# Patient Record
Sex: Male | Born: 1950 | Race: Black or African American | Hispanic: No | Marital: Single | Smoking: Former smoker
Health system: Southern US, Community
[De-identification: ages and names within clinical notes are randomized; demographics above are authoritative.]

## PROBLEM LIST (undated history)

## (undated) DIAGNOSIS — M1990 Unspecified osteoarthritis, unspecified site: Secondary | ICD-10-CM

## (undated) DIAGNOSIS — B20 Human immunodeficiency virus [HIV] disease: Secondary | ICD-10-CM

## (undated) DIAGNOSIS — J189 Pneumonia, unspecified organism: Secondary | ICD-10-CM

## (undated) DIAGNOSIS — B182 Chronic viral hepatitis C: Secondary | ICD-10-CM

## (undated) DIAGNOSIS — F1911 Other psychoactive substance abuse, in remission: Secondary | ICD-10-CM

## (undated) DIAGNOSIS — J449 Chronic obstructive pulmonary disease, unspecified: Secondary | ICD-10-CM

## (undated) DIAGNOSIS — I10 Essential (primary) hypertension: Secondary | ICD-10-CM

## (undated) HISTORY — PX: INGUINAL HERNIA REPAIR: SUR1180

## (undated) HISTORY — DX: Other psychoactive substance abuse, in remission: F19.11

---

## 2015-03-19 ENCOUNTER — Telehealth: Payer: Self-pay

## 2015-03-27 ENCOUNTER — Ambulatory Visit: Payer: Self-pay

## 2015-03-27 ENCOUNTER — Other Ambulatory Visit (HOSPITAL_COMMUNITY)
Admission: RE | Admit: 2015-03-27 | Discharge: 2015-03-27 | Disposition: A | Discharge status: Home / Self Care | Payer: Medicaid Other | Source: Ambulatory Visit | Attending: Internal Medicine | Admitting: Internal Medicine

## 2015-03-27 DIAGNOSIS — Z113 Encounter for screening for infections with a predominantly sexual mode of transmission: Secondary | ICD-10-CM | POA: Insufficient documentation

## 2015-03-27 DIAGNOSIS — R768 Other specified abnormal immunological findings in serum: Secondary | ICD-10-CM

## 2015-03-27 DIAGNOSIS — G8929 Other chronic pain: Secondary | ICD-10-CM

## 2015-03-27 DIAGNOSIS — M1611 Unilateral primary osteoarthritis, right hip: Secondary | ICD-10-CM

## 2015-03-27 DIAGNOSIS — R05 Cough: Secondary | ICD-10-CM

## 2015-03-27 DIAGNOSIS — J441 Chronic obstructive pulmonary disease with (acute) exacerbation: Secondary | ICD-10-CM

## 2015-03-27 DIAGNOSIS — Z8619 Personal history of other infectious and parasitic diseases: Secondary | ICD-10-CM

## 2015-03-27 DIAGNOSIS — R059 Cough, unspecified: Secondary | ICD-10-CM

## 2015-03-27 DIAGNOSIS — Z111 Encounter for screening for respiratory tuberculosis: Secondary | ICD-10-CM

## 2015-03-27 DIAGNOSIS — F1911 Other psychoactive substance abuse, in remission: Secondary | ICD-10-CM

## 2015-03-27 DIAGNOSIS — I10 Essential (primary) hypertension: Secondary | ICD-10-CM

## 2015-03-27 DIAGNOSIS — K7460 Unspecified cirrhosis of liver: Secondary | ICD-10-CM

## 2015-03-27 DIAGNOSIS — Z21 Asymptomatic human immunodeficiency virus [HIV] infection status: Secondary | ICD-10-CM

## 2015-03-27 DIAGNOSIS — E785 Hyperlipidemia, unspecified: Secondary | ICD-10-CM

## 2015-03-27 DIAGNOSIS — M25551 Pain in right hip: Secondary | ICD-10-CM

## 2015-03-27 LAB — CBC WITH DIFFERENTIAL/PLATELET
BASOS PCT: 3 % — AB (ref 0–1)
Basophils Absolute: 0.1 10*3/uL (ref 0.0–0.1)
EOS PCT: 2 % (ref 0–5)
Eosinophils Absolute: 0.1 10*3/uL (ref 0.0–0.7)
HEMATOCRIT: 45.6 % (ref 39.0–52.0)
HEMOGLOBIN: 15.7 g/dL (ref 13.0–17.0)
LYMPHS PCT: 38 % (ref 12–46)
Lymphs Abs: 1.5 10*3/uL (ref 0.7–4.0)
MCH: 31.8 pg (ref 26.0–34.0)
MCHC: 34.4 g/dL (ref 30.0–36.0)
MCV: 92.3 fL (ref 78.0–100.0)
MONO ABS: 0.4 10*3/uL (ref 0.1–1.0)
MONOS PCT: 10 % (ref 3–12)
MPV: 9.2 fL (ref 8.6–12.4)
Neutro Abs: 1.9 10*3/uL (ref 1.7–7.7)
Neutrophils Relative %: 47 % (ref 43–77)
Platelets: 297 10*3/uL (ref 150–400)
RBC: 4.94 MIL/uL (ref 4.22–5.81)
RDW: 13.8 % (ref 11.5–15.5)
WBC: 4 10*3/uL (ref 4.0–10.5)

## 2015-03-27 LAB — LIPID PANEL
Cholesterol: 129 mg/dL (ref 125–200)
HDL: 77 mg/dL (ref >=40)
LDL CALC: 36 mg/dL (ref <130)
TRIGLYCERIDES: 81 mg/dL (ref <150)
Total CHOL/HDL Ratio: 1.7 Ratio (ref <=5.0)
VLDL: 16 mg/dL (ref <30)

## 2015-03-27 LAB — COMPLETE METABOLIC PANEL WITH GFR
ALBUMIN: 3.8 g/dL (ref 3.6–5.1)
ALK PHOS: 84 U/L (ref 40–115)
ALT: 35 U/L (ref 9–46)
AST: 79 U/L — AB (ref 10–35)
BUN: 7 mg/dL (ref 7–25)
CALCIUM: 9.2 mg/dL (ref 8.6–10.3)
CHLORIDE: 100 mmol/L (ref 98–110)
CO2: 27 mmol/L (ref 20–31)
Creat: 0.73 mg/dL (ref 0.70–1.25)
Glucose, Bld: 81 mg/dL (ref 65–99)
POTASSIUM: 3.9 mmol/L (ref 3.5–5.3)
Sodium: 135 mmol/L (ref 135–146)
Total Bilirubin: 0.5 mg/dL (ref 0.2–1.2)
Total Protein: 9.4 g/dL — ABNORMAL HIGH (ref 6.1–8.1)

## 2015-03-27 LAB — IRON: IRON: 124 ug/dL (ref 50–180)

## 2015-03-28 LAB — URINALYSIS
Bilirubin Urine: NEGATIVE
Glucose, UA: NEGATIVE
HGB URINE DIPSTICK: NEGATIVE
Ketones, ur: NEGATIVE
NITRITE: NEGATIVE
PH: 6 (ref 5.0–8.0)
Protein, ur: NEGATIVE
SPECIFIC GRAVITY, URINE: 1.01 (ref 1.001–1.035)

## 2015-03-28 LAB — ANA: ANA: NEGATIVE

## 2015-03-28 LAB — HEPATITIS B SURFACE ANTIBODY,QUALITATIVE: HEP B S AB: NEGATIVE

## 2015-03-28 LAB — PROTIME-INR
INR: 1.05 (ref <1.50)
PROTHROMBIN TIME: 13.8 s (ref 11.6–15.2)

## 2015-03-28 LAB — T-HELPER CELL (CD4) - (RCID CLINIC ONLY)
CD4 % Helper T Cell: 29 % — ABNORMAL LOW (ref 33–55)
CD4 T Cell Abs: 430 /uL (ref 400–2700)

## 2015-03-28 LAB — HEPATITIS B CORE ANTIBODY, TOTAL: HEP B C TOTAL AB: REACTIVE — AB

## 2015-03-28 LAB — HEPATITIS B SURFACE ANTIGEN: HEP B S AG: NEGATIVE

## 2015-03-28 LAB — HEPATITIS C ANTIBODY: HCV AB: REACTIVE — AB

## 2015-03-28 LAB — HEPATITIS A ANTIBODY, TOTAL: Hep A Total Ab: REACTIVE — AB

## 2015-03-28 LAB — RPR

## 2015-03-29 LAB — QUANTIFERON TB GOLD ASSAY (BLOOD)
INTERFERON GAMMA RELEASE ASSAY: NEGATIVE
Mitogen value: >10 IU/mL
QUANTIFERON TB AG MINUS NIL: 0.01 [IU]/mL
Quantiferon Nil Value: 0.06 IU/mL
TB Ag value: 0.07 IU/mL

## 2015-03-30 LAB — HIV-1 RNA ULTRAQUANT REFLEX TO GENTYP+
HIV 1 RNA Quant: 116 copies/mL — ABNORMAL HIGH (ref <20)
HIV-1 RNA QUANT, LOG: 2.06 {Log_copies}/mL — AB (ref <1.30)

## 2015-03-30 LAB — HEPATITIS C RNA QUANTITATIVE
HCV Quantitative Log: 6.97 {Log} — ABNORMAL HIGH (ref <1.18)
HCV Quantitative: 9391549 IU/mL — ABNORMAL HIGH (ref <15)

## 2015-03-31 DIAGNOSIS — M1611 Unilateral primary osteoarthritis, right hip: Secondary | ICD-10-CM | POA: Insufficient documentation

## 2015-03-31 DIAGNOSIS — J441 Chronic obstructive pulmonary disease with (acute) exacerbation: Secondary | ICD-10-CM | POA: Insufficient documentation

## 2015-03-31 DIAGNOSIS — R768 Other specified abnormal immunological findings in serum: Secondary | ICD-10-CM | POA: Insufficient documentation

## 2015-03-31 DIAGNOSIS — F1911 Other psychoactive substance abuse, in remission: Secondary | ICD-10-CM | POA: Insufficient documentation

## 2015-03-31 DIAGNOSIS — Z8619 Personal history of other infectious and parasitic diseases: Secondary | ICD-10-CM | POA: Insufficient documentation

## 2015-03-31 HISTORY — DX: Other psychoactive substance abuse, in remission: F19.11

## 2015-03-31 LAB — HEPATITIS C RNA QUANTITATIVE
HCV QUANT LOG: 6.36 {Log} — AB (ref <1.18)
HCV Quantitative: 2314571 IU/mL — ABNORMAL HIGH (ref <15)

## 2015-03-31 LAB — URINE CYTOLOGY ANCILLARY ONLY
CHLAMYDIA, DNA PROBE: NEGATIVE
Neisseria Gonorrhea: NEGATIVE

## 2015-04-01 NOTE — Progress Notes (Signed)
Patient was referred to [facility]  for HIV evaluation by [facility]. Patient needs surgery of right hip and evaluation of HIV is needed prior to surgical procedure.  [name] states he was diagnosed with HIV over 20 years ago in [place].  He has never been on HAART. His last labs were 3 years ago in [place] and he was not sure of the results. He states the ID physicians told him he did not need to start medications. He has never been in consistent care for his HIV and never experienced any symptoms that he thought would be related.  However he is ready to start medications if needed especially to be cleared for surgery. He was previously in a 20 year relationship with a male partner who died about 6 months ago of complications related to a surgical procedure. She was not HIV positive. He has a past history of alcohol and heroin abuse. He has been without drugs for 20 years and drinks an occasional beer. He provides care for his elderly Mother and prior to this was caring for his daughter who was ill and recently passed away.       Laurell Josephs, RN

## 2015-04-02 LAB — HLA B*5701: HLA-B*5701 w/rflx HLA-B High: NEGATIVE

## 2015-04-02 LAB — HEPATITIS C GENOTYPE

## 2015-04-08 DIAGNOSIS — M25551 Pain in right hip: Secondary | ICD-10-CM

## 2015-04-08 DIAGNOSIS — G8929 Other chronic pain: Secondary | ICD-10-CM | POA: Insufficient documentation

## 2015-04-10 ENCOUNTER — Ambulatory Visit: Payer: Medicaid Other | Admitting: Internal Medicine

## 2015-04-15 ENCOUNTER — Ambulatory Visit (INDEPENDENT_AMBULATORY_CARE_PROVIDER_SITE_OTHER): Payer: Medicaid Other | Admitting: Internal Medicine

## 2015-04-15 ENCOUNTER — Encounter: Payer: Self-pay | Admitting: Internal Medicine

## 2015-04-15 ENCOUNTER — Other Ambulatory Visit: Payer: Self-pay | Admitting: Internal Medicine

## 2015-04-15 VITALS — BP 162/90 | HR 77 | Temp 97.5°F | Ht 71.0 in | Wt 126.0 lb

## 2015-04-15 DIAGNOSIS — B20 Human immunodeficiency virus [HIV] disease: Secondary | ICD-10-CM

## 2015-04-15 DIAGNOSIS — G8929 Other chronic pain: Secondary | ICD-10-CM | POA: Diagnosis not present

## 2015-04-15 DIAGNOSIS — M25551 Pain in right hip: Secondary | ICD-10-CM | POA: Diagnosis not present

## 2015-04-15 DIAGNOSIS — B182 Chronic viral hepatitis C: Secondary | ICD-10-CM | POA: Diagnosis not present

## 2015-04-15 DIAGNOSIS — M1611 Unilateral primary osteoarthritis, right hip: Secondary | ICD-10-CM

## 2015-04-15 MED ORDER — ELVITEG-COBIC-EMTRICIT-TENOFAF 150-150-200-10 MG PO TABS: 1.0000 | ORAL_TABLET | Freq: Every day | ORAL | Status: DC

## 2015-04-15 NOTE — Progress Notes (Signed)
Patient ID: Kevin Jimenez, male    DOB: [birth year], 64 y.o.   MRN: [id number]  Reason for visit: to establish care as a new patient with HIV  HPI:   Patient was first diagnosed 20 years ago but never on treatment.  He has always had a very low viral load and normal CD4 count c/w an Engineer, agricultural.  He was tested as part of his history of drug use.  The CD4 count is 430, viral load 116.  There have been no associated symptoms.  Never had an OI, no history of other STI.  Also with a history of hepatitis C, never treated, diagnosed 20 years ago as well. Last drug use 20 years ago. No substance abuse history since.  Reports a liver biopsy about 10 years ago without significant damage.  Also with osteoarthritis and went to see Dr. Magnus Ivan for consideration of THA but with untreated HIV and HCV, was told to get treatment first.  Has genotype 1b.    Past Medical History  Diagnosis Date  . History of substance abuse 03/31/2015    Alcohol and heroin abuse   . HIV infection (HCC)     Prior to Admission medications   Medication Sig Start Date End Date Taking? Authorizing Provider  benzonatate (TESSALON) 100 MG capsule Take by mouth 3 (three) times daily as needed for cough.   Yes Historical Provider, MD  lisinopril (PRINIVIL,ZESTRIL) 10 MG tablet Take 10 mg by mouth daily.   Yes Historical Provider, MD  meloxicam (MOBIC) 15 MG tablet Take 15 mg by mouth daily.   Yes Historical Provider, MD  naproxen sodium (ANAPROX) 220 MG tablet Take 220 mg by mouth 3 (three) times daily with meals.   Yes Historical Provider, MD  elvitegravir-cobicistat-emtricitabine-tenofovir (GENVOYA) 150-150-200-10 MG TABS tablet Take 1 tablet by mouth daily. 04/15/15   Gardiner Barefoot, MD    No Known Allergies  Social History  Substance Use Topics  . Smoking status: Current Every Day Smoker -- 0.10 packs/day for 30 years    Types: Cigarettes  . Smokeless tobacco: Never Used  . Alcohol Use: No    Family History  Problem  Relation Age of Onset  . Hypertension Mother   . Rheum arthritis Mother     Review of Systems Constitutional: negative for fatigue and malaise Gastrointestinal: negative for nausea and diarrhea Musculoskeletal: negative for myalgias, arthralgias and except for hip All other systems reviewed and are negative   CONSTITUTIONAL:in no apparent distress and alert  Filed Vitals:   04/15/15 1352  BP: 162/90  Pulse: 77  Temp: 97.5 F (36.4 C)   EYES: anicteric HENT: no thrush, poor dentition CARD:Cor RRR and No murmurs RESP:clear; normal respiratory effort WU:JWJXB sounds are normal, liver is not enlarged, spleen is not enlarged JY:NWGNFAOZHY pulses normal, no pedal edema, no clubbing or cyanosis SKIN:no rashes NEURO: non-focal  Lab Results  Component Value Date   HIV1RNAQUANT 116* 03/27/2015   No components found for: HIV1GENOTYPRPLUS No components found for: THELPERCELL  Assessment: new patient here with HIV.  He is considered an elite controller with no strong indication for treatment.  In these patients though, it is suggested to consider treatment due to decreased risk of HIV-associated illnesses such as cancer, end organ failure but his immune system has held stable without ever starting.  I did recommend treatment and he is willing to start.       Discussed with patient treatment options and side effects, benefits of treatment, long term outcomes.  I discussed the severity of untreated HIV including higher cancer risk, opportunistic infections, renal failure.  Also discussed needing to use condoms, partner disclosure, necessary vaccines, blood monitoring.  All questions answered.     For hepatitis C, I also will do liver staging and get him treated with Zepatier or Harvoni for 12 weeks as soon as possible.   Plan: 1) Start Genvoya 2) labs in 3 weeks 3) elastography  4) rtc after elastography

## 2015-04-15 NOTE — Addendum Note (Signed)
Addended by: Jennet Maduro D on: 04/15/2015 04:55 PM   Modules accepted: Orders

## 2015-04-15 NOTE — Assessment & Plan Note (Addendum)
Will do liver staging and then try to get him on treatment.

## 2015-04-15 NOTE — Assessment & Plan Note (Signed)
Will refer to pain management

## 2015-04-16 ENCOUNTER — Ambulatory Visit: Payer: Medicaid Other | Admitting: Internal Medicine

## 2015-04-21 ENCOUNTER — Telehealth: Payer: Self-pay | Admitting: *Deleted

## 2015-04-21 NOTE — Telephone Encounter (Signed)
Notified patient of ultrasound scheduled for 05/15/15 at 8:45 AM at [facility]. Nothing to eat or drink after midnight. PA # [id number]

## 2015-05-15 ENCOUNTER — Ambulatory Visit (HOSPITAL_COMMUNITY): Payer: Medicaid Other

## 2015-05-15 ENCOUNTER — Other Ambulatory Visit: Payer: Medicaid Other

## 2015-05-15 ENCOUNTER — Telehealth: Payer: Self-pay | Admitting: *Deleted

## 2015-05-15 DIAGNOSIS — B20 Human immunodeficiency virus [HIV] disease: Secondary | ICD-10-CM

## 2015-05-15 LAB — CBC WITH DIFFERENTIAL/PLATELET
BASOS ABS: 0.2 10*3/uL — AB (ref 0.0–0.1)
Basophils Relative: 3 % — ABNORMAL HIGH (ref 0–1)
EOS ABS: 0.1 10*3/uL (ref 0.0–0.7)
EOS PCT: 1 % (ref 0–5)
HCT: 41.2 % (ref 39.0–52.0)
Hemoglobin: 14.3 g/dL (ref 13.0–17.0)
LYMPHS ABS: 1.7 10*3/uL (ref 0.7–4.0)
Lymphocytes Relative: 32 % (ref 12–46)
MCH: 31.8 pg (ref 26.0–34.0)
MCHC: 34.7 g/dL (ref 30.0–36.0)
MCV: 91.6 fL (ref 78.0–100.0)
MONO ABS: 0.5 10*3/uL (ref 0.1–1.0)
MPV: 8.4 fL — AB (ref 8.6–12.4)
Monocytes Relative: 10 % (ref 3–12)
Neutro Abs: 2.9 10*3/uL (ref 1.7–7.7)
Neutrophils Relative %: 54 % (ref 43–77)
PLATELETS: 390 10*3/uL (ref 150–400)
RBC: 4.5 MIL/uL (ref 4.22–5.81)
RDW: 14 % (ref 11.5–15.5)
WBC: 5.3 10*3/uL (ref 4.0–10.5)

## 2015-05-15 LAB — COMPLETE METABOLIC PANEL WITH GFR
ALT: 30 U/L (ref 9–46)
AST: 49 U/L — ABNORMAL HIGH (ref 10–35)
Albumin: 3.9 g/dL (ref 3.6–5.1)
Alkaline Phosphatase: 72 U/L (ref 40–115)
BUN: 6 mg/dL — ABNORMAL LOW (ref 7–25)
CHLORIDE: 98 mmol/L (ref 98–110)
CO2: 25 mmol/L (ref 20–31)
CREATININE: 0.8 mg/dL (ref 0.70–1.25)
Calcium: 9.6 mg/dL (ref 8.6–10.3)
GFR, Est Non African American: >89 mL/min (ref >=60)
Glucose, Bld: 91 mg/dL (ref 65–99)
POTASSIUM: 4.6 mmol/L (ref 3.5–5.3)
Sodium: 131 mmol/L — ABNORMAL LOW (ref 135–146)
Total Bilirubin: 0.5 mg/dL (ref 0.2–1.2)
Total Protein: 9.1 g/dL — ABNORMAL HIGH (ref 6.1–8.1)

## 2015-05-15 NOTE — Telephone Encounter (Signed)
Patient did not go to his ultrasound appt today and wants it rescheduled. This test will need to be precertified again. Appt with Dr. Luciana Axe cancelled for 05/29/15 as he wanted to see him after ultrasound. New appt is 06/16/14 at 10:45 AM. Nothing to eat or drink 6 hours prior. Wendall Mola

## 2015-05-16 LAB — HIV-1 RNA QUANT-NO REFLEX-BLD
HIV 1 RNA Quant: 147 copies/mL — ABNORMAL HIGH (ref <20)
HIV-1 RNA QUANT, LOG: 2.17 {Log_copies}/mL — AB (ref <1.30)

## 2015-05-16 LAB — T-HELPER CELL (CD4) - (RCID CLINIC ONLY)
CD4 % Helper T Cell: 21 % — ABNORMAL LOW (ref 33–55)
CD4 T CELL ABS: 330 /uL — AB (ref 400–2700)

## 2015-05-29 ENCOUNTER — Ambulatory Visit: Payer: Medicaid Other | Admitting: Internal Medicine

## 2015-06-17 ENCOUNTER — Ambulatory Visit (HOSPITAL_COMMUNITY)
Admission: RE | Admit: 2015-06-17 | Discharge: 2015-06-17 | Disposition: A | Discharge status: Home / Self Care | Payer: Medicaid Other | Source: Ambulatory Visit | Attending: Internal Medicine | Admitting: Internal Medicine

## 2015-06-17 DIAGNOSIS — B20 Human immunodeficiency virus [HIV] disease: Secondary | ICD-10-CM | POA: Diagnosis not present

## 2015-06-17 DIAGNOSIS — K8020 Calculus of gallbladder without cholecystitis without obstruction: Secondary | ICD-10-CM | POA: Insufficient documentation

## 2015-06-17 DIAGNOSIS — B182 Chronic viral hepatitis C: Secondary | ICD-10-CM | POA: Diagnosis not present

## 2015-07-15 ENCOUNTER — Encounter: Payer: Self-pay | Admitting: Internal Medicine

## 2015-07-15 ENCOUNTER — Ambulatory Visit (INDEPENDENT_AMBULATORY_CARE_PROVIDER_SITE_OTHER): Payer: Medicaid Other | Admitting: Internal Medicine

## 2015-07-15 VITALS — BP 182/115 | HR 83 | Temp 98.1°F | Ht 71.0 in | Wt 122.0 lb

## 2015-07-15 DIAGNOSIS — B20 Human immunodeficiency virus [HIV] disease: Secondary | ICD-10-CM

## 2015-07-15 DIAGNOSIS — K7400 Hepatic fibrosis, unspecified: Secondary | ICD-10-CM

## 2015-07-15 DIAGNOSIS — B182 Chronic viral hepatitis C: Secondary | ICD-10-CM | POA: Diagnosis not present

## 2015-07-15 DIAGNOSIS — M1611 Unilateral primary osteoarthritis, right hip: Secondary | ICD-10-CM

## 2015-07-15 DIAGNOSIS — M1990 Unspecified osteoarthritis, unspecified site: Secondary | ICD-10-CM

## 2015-07-15 MED ORDER — EMTRICITABINE-TENOFOVIR AF 200-25 MG PO TABS: 1.0000 | ORAL_TABLET | Freq: Every day | ORAL | Status: DC

## 2015-07-15 MED ORDER — DOLUTEGRAVIR SODIUM 50 MG PO TABS: 50.0000 mg | ORAL_TABLET | Freq: Every day | ORAL | Status: DC

## 2015-07-15 NOTE — Assessment & Plan Note (Signed)
Minimal fibrosis.  Will not need HCC screening.

## 2015-07-15 NOTE — Assessment & Plan Note (Signed)
I will try to get him on Harvoni, though medicaid may decline him due to lack of liver damage.

## 2015-07-15 NOTE — Assessment & Plan Note (Signed)
May be related to cobi,  Will change him to [facility] and [place] for treatment and recheck labs in 4 weeks.

## 2015-07-15 NOTE — Progress Notes (Signed)
CC: Follow up for HIV  Interval history: He was seen as a new patient in November and has a history of HIV, elite controller with minimal viral load and good CD4 count.  He was seen by Dr. Magnus Ivan of orthopedics because he was interested in hip replacement and was told to get his HIV and hepatitis C treated, under control.  He therefore established with our clinic and I started him on Genvoya and did elastography of his liver.  He took [place] about 10 days and stopped due to n/v.  Elastography with F0/1.  Hepatosteatosis noted.    Prior to Admission medications   Medication Sig Start Date End Date Taking? Authorizing Provider  benzonatate (TESSALON) 100 MG capsule Take by mouth 3 (three) times daily as needed for cough. Reported on 07/15/2015    Historical Provider, MD  dolutegravir (TIVICAY) 50 MG tablet Take 1 tablet (50 mg total) by mouth daily. 07/15/15   Gardiner Barefoot, MD  emtricitabine-tenofovir AF (DESCOVY) 200-25 MG tablet Take 1 tablet by mouth daily. 07/15/15   Gardiner Barefoot, MD  lisinopril (PRINIVIL,ZESTRIL) 10 MG tablet Take 10 mg by mouth daily. Reported on 07/15/2015    Historical Provider, MD  meloxicam (MOBIC) 15 MG tablet Take 15 mg by mouth daily. Reported on 07/15/2015    Historical Provider, MD  naproxen sodium (ANAPROX) 220 MG tablet Take 220 mg by mouth 3 (three) times daily with meals. Reported on 07/15/2015    Historical Provider, MD    Review of Systems Constitutional: negative for fatigue and malaise Gastrointestinal: negative for diarrhea and constipation Musculoskeletal: negative for myalgias All other systems reviewed and are negative   Physical Exam: CONSTITUTIONAL:in no apparent distress  Filed Vitals:   07/15/15 1050  BP: 182/115  Pulse: 83  Temp: 98.1 F (36.7 C)   Eyes: anicteric HENT: no thrush, no cervical lymphadenopathy Respiratory: Normal respiratory effort; CTA B  Lab Results  Component Value Date   HIV1RNAQUANT 147* 05/15/2015   HIV1RNAQUANT  116* 03/27/2015   No components found for: HIV1GENOTYPRPLUS No components found for: THELPERCELL   Social History   Social History  . Marital Status: Unknown    Spouse Name: N/A  . Number of Children: N/A  . Years of Education: N/A   Occupational History  . Not on file.   Social History Main Topics  . Smoking status: Current Every Day Smoker -- 0.10 packs/day for 30 years    Types: Cigarettes  . Smokeless tobacco: Never Used  . Alcohol Use: No  . Drug Use: No     Comment: no heroin  since 2004   . Sexual Activity:    Partners: Female    Pharmacist, hospital Protection: Condom   Other Topics Concern  . Not on file   Social History Narrative

## 2015-07-15 NOTE — Assessment & Plan Note (Signed)
We should know by his next visit if he is denied hepatitis C treatment and if his HIV is suppressed and hopefully can proceed to hip replacement, if indicated.

## 2015-07-16 ENCOUNTER — Other Ambulatory Visit: Payer: Self-pay | Admitting: *Deleted

## 2015-07-16 MED ORDER — EMTRICITABINE-TENOFOVIR AF 200-25 MG PO TABS: 1.0000 | ORAL_TABLET | Freq: Every day | ORAL | Status: DC

## 2015-07-16 MED ORDER — DOLUTEGRAVIR SODIUM 50 MG PO TABS: 50.0000 mg | ORAL_TABLET | Freq: Every day | ORAL | Status: DC

## 2015-08-04 ENCOUNTER — Encounter: Payer: Self-pay | Admitting: Pain Medicine

## 2015-08-04 ENCOUNTER — Ambulatory Visit: Payer: Medicaid Other | Attending: Pain Medicine | Admitting: Pain Medicine

## 2015-08-04 VITALS — BP 194/112 | HR 88 | Temp 98.5°F | Resp 18 | Ht 71.0 in | Wt 137.0 lb

## 2015-08-04 DIAGNOSIS — M1990 Unspecified osteoarthritis, unspecified site: Secondary | ICD-10-CM

## 2015-08-04 DIAGNOSIS — Z79899 Other long term (current) drug therapy: Secondary | ICD-10-CM

## 2015-08-04 DIAGNOSIS — F17210 Nicotine dependence, cigarettes, uncomplicated: Secondary | ICD-10-CM | POA: Insufficient documentation

## 2015-08-04 DIAGNOSIS — B182 Chronic viral hepatitis C: Secondary | ICD-10-CM | POA: Diagnosis not present

## 2015-08-04 DIAGNOSIS — M25551 Pain in right hip: Secondary | ICD-10-CM | POA: Diagnosis not present

## 2015-08-04 DIAGNOSIS — Z21 Asymptomatic human immunodeficiency virus [HIV] infection status: Secondary | ICD-10-CM | POA: Diagnosis not present

## 2015-08-04 DIAGNOSIS — F1190 Opioid use, unspecified, uncomplicated: Secondary | ICD-10-CM | POA: Insufficient documentation

## 2015-08-04 DIAGNOSIS — M1611 Unilateral primary osteoarthritis, right hip: Secondary | ICD-10-CM | POA: Insufficient documentation

## 2015-08-04 DIAGNOSIS — J441 Chronic obstructive pulmonary disease with (acute) exacerbation: Secondary | ICD-10-CM | POA: Insufficient documentation

## 2015-08-04 DIAGNOSIS — M069 Rheumatoid arthritis, unspecified: Secondary | ICD-10-CM | POA: Insufficient documentation

## 2015-08-04 DIAGNOSIS — D571 Sickle-cell disease without crisis: Secondary | ICD-10-CM | POA: Insufficient documentation

## 2015-08-04 DIAGNOSIS — Z87898 Personal history of other specified conditions: Secondary | ICD-10-CM

## 2015-08-04 DIAGNOSIS — K740 Hepatic fibrosis: Secondary | ICD-10-CM | POA: Insufficient documentation

## 2015-08-04 DIAGNOSIS — Z5181 Encounter for therapeutic drug level monitoring: Secondary | ICD-10-CM | POA: Insufficient documentation

## 2015-08-04 DIAGNOSIS — Z79891 Long term (current) use of opiate analgesic: Secondary | ICD-10-CM | POA: Diagnosis not present

## 2015-08-04 DIAGNOSIS — G8929 Other chronic pain: Secondary | ICD-10-CM | POA: Diagnosis not present

## 2015-08-04 DIAGNOSIS — B20 Human immunodeficiency virus [HIV] disease: Secondary | ICD-10-CM

## 2015-08-04 DIAGNOSIS — F1911 Other psychoactive substance abuse, in remission: Secondary | ICD-10-CM

## 2015-08-04 DIAGNOSIS — M25559 Pain in unspecified hip: Secondary | ICD-10-CM | POA: Diagnosis present

## 2015-08-04 MED ORDER — GABAPENTIN 100 MG PO CAPS: 100.0000 mg | ORAL_CAPSULE | Freq: Three times a day (TID) | ORAL | Status: DC

## 2015-08-04 MED ORDER — MELOXICAM 15 MG PO TABS: 15.0000 mg | ORAL_TABLET | Freq: Every day | ORAL | Status: DC

## 2015-08-04 NOTE — Progress Notes (Signed)
Patient's Name: Kevin Jimenez MRN: [id number] DOB: 1950-09-19 DOS: 08/04/2015  Primary Reason(s) for Visit: Initial Patient Evaluation CC: Hip Pain   HPI  Kevin Jimenez is a 65 y.o. year old, male patient, who comes today for an initial evaluation. He has Arthritis of right hip; COPD exacerbation (HCC); History of substance abuse; History of positive hepatitis C; Hepatitis B core antibody positive; Chronic hip pain (Location of Primary Source of Pain) (Right); HIV (+) Human immunodeficiency virus disease (HCC); Chronic hepatitis C without hepatic coma (HCC); Liver fibrosis (HCC); Chronic pain; Long term current use of opiate analgesic; Long term prescription opiate use; Opiate use; Encounter for therapeutic drug level monitoring; and Osteoarthritis of hip (Location of Primary Source of Pain) (Right) on his problem list.. His primarily concern today is the Hip Pain   The patient comes into our practice today for the first time indicating that his primary pain is that of the right hip. In addition he describes having pain over the right thigh. The patient has a history significant for being HIV positive, having hepatitis C, and a history of substance abuse with alcohol and heroin, as well as as currently been using his mother's Tylenol # 3 medications.  Reported Pain Score: 10-Worst pain ever, clinically he looks like a 2-3/10. Clear symptom exaggeration. Reported level of pain is incompatible with clinical observations. Pain Type: Chronic pain Pain Location: Hip Pain Orientation: Right Pain Descriptors / Indicators: Aching, Sharp, Shooting, Constant Pain Frequency: Constant  Onset and Duration: Gradual and Present longer than 3 months Cause of pain: Unknown Severity: Getting worse, NAS-11 at its worse: 10/10, NAS-11 at its best: 10/10, NAS-11 now: 10/10 and NAS-11 on the average: 10/10 Timing: Morning, Noon, Afternoon, Evening, Night, Not influenced by the time of the day, During activity or  exercise, After activity or exercise and After a period of immobility Aggravating Factors: Bending, Climbing, Kneeling, Lifiting, Motion, Prolonged sitting, Prolonged standing, Squatting, Stooping , Twisting, Walking, Walking uphill, Walking downhill and Working Alleviating Factors: Medications Associated Problems: Night-time cramps, Fatigue, Weakness, Pain that wakes patient up and Pain that does not allow patient to sleep Quality of Pain: Aching, Agonizing, Annoying, Constant, Cramping, Cruel, Deep, Disabling, Distressing, Getting longer, Heavy, Horrible, Nagging, Numb, Pressure-like, Pulsating, Punishing, Sharp, Sickening, Splitting, Stabbing and Uncomfortable Previous Examinations or Tests: X-rays Previous Treatments: The patient denies Biofeedback, chiropractic manipulations, cryo-analgesia, epidural steroid injections, facet blocks, hypnotherapy, morphine pump, physical therapy, pool exercises, radiofrequency, relaxation therapy, spinal cord stimulation, steroid treatments by mouth, stretching exercises, TENS, traction, and trigger points.  Historic Controlled Substance Pharmacotherapy Review  Previously Prescribed Opioids:  Analgesic: The patient indicates taking Tylenol with Codeine which belongs to his mother. MME/day: Unknown  Historical Background Evaluation: [place] PDMP: Five (5) year initial data search conducted. Historical Hospital-associated UDS Results:  No results found for: THCU, COCAINSCRNUR, PCPSCRNUR, MDMA, AMPHETMU, METHADONE, ETOH UDS Results: No UDS available, at this time UDS Interpretation: No UDS available, at this time Medication Assessment Form: Not applicable. Initial evaluation. The patient has not received any medications from our practice Treatment compliance: Not applicable. Initial evaluation Risk Assessment: Aberrant Behavior: Obtaining medications from illicit sources, diminised ability to recongnize a problem with one's behavior or  use of the medication,  obtaining controlled sustances from inappropriate sources and use of someone else's medication  Opioid Fatal Overdose Risk Factors: Prior history of IV drug abuse, hepatic disease, and history of substance abuse. Substance Use Disorder (SUD) Risk Level: Very high Opioid Risk Tool (ORT) Score: Total Score: 3  Low Risk for SUD (Score <3) (not likely to be accurate or reliable) Depression Scale Score: PHQ-2: PHQ-2 Total Score: 0 No depression (0) PHQ-9: PHQ-9 Total Score: 0 No depression (0-4)  Pharmacologic Plan: Pending ordered tests and/or consults  Neuromodulation Therapy Review  Type: No neuromodulatory devices implanted Side-effects or Adverse reactions: No device reported Effectiveness: No device reported  Allergies  Kevin Jimenez has No Known Allergies.  Meds  The patient has a current medication list which includes the following prescription(s): benzonatate, dolutegravir, emtricitabine-tenofovir af, lisinopril, meloxicam, and gabapentin. Requested Prescriptions   Signed Prescriptions Disp Refills  . gabapentin (NEURONTIN) 100 MG capsule 90 capsule 0    Sig: Take 1 capsule (100 mg total) by mouth every 8 (eight) hours.  . meloxicam (MOBIC) 15 MG tablet 30 tablet 0    Sig: Take 1 tablet (15 mg total) by mouth daily.    ROS  Cardiovascular History: Negative for hypertension, coronary artery diseas, myocardial infraction, anticoagulant therapy or heart failure Pulmonary or Respiratory History: Emphysema Neurological History: Negative for epilepsy, stroke, urinary or fecal inontinence, spina bifida or tethered cord syndrome Psychological-Psychiatric History: Insomnia Gastrointestinal History: Hepatitis Genitourinary History: Negative for nephrolithiasis, hematuria, renal failure or chronic kidney disease Hematological History: Sickle cell disease or trait Endocrine History: Negative for diabetes or thyroid disease Rheumatologic History: Rheumatoid arthritis Musculoskeletal  History: Negative for myasthenia gravis, muscular dystrophy, multiple sclerosis or malignant hyperthermia Work History: Unemployed  [employer]  Medical:  Kevin Jimenez  has a past medical history of History of substance abuse (03/31/2015) and HIV infection (HCC). Family: family history includes Hypertension in his mother; Rheum arthritis in his mother. Surgical:  has past surgical history that includes Hernia repair. Tobacco:  reports that he has been smoking Cigarettes.  He has a 3 pack-year smoking history. He has never used smokeless tobacco. Alcohol:  reports that he does not drink alcohol. Drug:  reports that he does not use illicit drugs.  Physical Exam  Vitals:  Today's Vitals   08/04/15 1430 08/04/15 1435  BP: 194/112   Pulse: 88   Temp: 98.5 F (36.9 C)   Resp: 18   Height: 5\' 11"  (1.803 m)   Weight: 137 lb (62.143 kg)   SpO2: 98%   PainSc: 10-Worst pain ever 10-Worst pain ever  PainLoc: Hip     Calculated BMI: Body mass index is 19.12 kg/(m^2).     General appearance: alert, cooperative, appears older than stated age, cachectic and no distress Eyes: PERLA Respiratory: No evidence respiratory distress, no audible rales or ronchi and no use of accessory muscles of respiration  Lumbar Spine Inspection: No gross anomalies detected Alignment: Symetrical ROM: Adequate Palpation: WNL Provocative Tests: Lumbar Hyperextension and rotation test: deferred Patrick's Maneuver: deferred Gait: Antalgic (limping)  Lower Extremities Inspection: No gross anomalies detected ROM: Decreased range of motion of the right hip. Sensory: Normal Motor: Unremarkable  Toe walk (S1): WNL  Heal walk (L5): WNL  Assessment  Primary Diagnosis & Pertinent Problem List: The primary encounter diagnosis was Chronic pain. Diagnoses of Long term current use of opiate analgesic, Long term prescription opiate use, Opiate use, Encounter for therapeutic drug level monitoring, Arthritis of right hip,  Primary osteoarthritis of right hip, History of substance abuse, Chronic hip pain (Location of Primary Source of Pain) (Right), and HIV (+) Human immunodeficiency virus disease (HCC) were also pertinent to this visit.  Visit Diagnosis: 1. Chronic pain   2. Long term current use of opiate analgesic   3. Long term  prescription opiate use   4. Opiate use   5. Encounter for therapeutic drug level monitoring   6. Arthritis of right hip   7. Primary osteoarthritis of right hip   8. History of substance abuse   9. Chronic hip pain (Location of Primary Source of Pain) (Right)   10. HIV (+) Human immunodeficiency virus disease (HCC)     Assessment: History of substance abuse History of alcohol and heroine abuse as well as using his mother's Tylenol # 3 medication.    Plan of Care  Note: As per protocol, today's visit has been an evaluation only. We have not taken over the patient's controlled substance management.  Pharmacotherapy (Medications Ordered): Meds ordered this encounter  Medications  . gabapentin (NEURONTIN) 100 MG capsule    Sig: Take 1 capsule (100 mg total) by mouth every 8 (eight) hours.    Dispense:  90 capsule    Refill:  0    Do not place this medication, or any other prescription from our practice, on "Automatic Refill". Patient may have prescription filled one day early if pharmacy is closed on scheduled refill date.  . meloxicam (MOBIC) 15 MG tablet    Sig: Take 1 tablet (15 mg total) by mouth daily.    Dispense:  30 tablet    Refill:  0    Do not place this medication, or any other prescription from our practice, on "Automatic Refill". Patient may have prescription filled one day early if pharmacy is closed on scheduled refill date.    Lab-work & Procedure Ordered: Orders Placed This Encounter  Procedures  . DG HIP UNILAT W OR W/O PELVIS 2-3 VIEWS RIGHT    Standing Status: Future     Number of Occurrences:      Standing Expiration Date: 08/03/2016    Order  Specific Question:  Reason for Exam (SYMPTOM  OR DIAGNOSIS REQUIRED)    Answer:  Right hip pain/arthralgia    Order Specific Question:  Preferred imaging location?    Answer:  [facility]    Order Specific Question:  Call Results- Best Contact Number?    Answer:  [phone] (Pain Clinic facility) (Dr. Laban Emperor)  . [facility] Drug Analysis, Ur    Volume: 30 ml(s). Minimum 3 ml of urine is needed. Document temperature of fresh sample. Indications: Long term (current) use of opiate analgesic (Z79.891) Test#: 283151 (Comprehensive Profile)  . Comprehensive metabolic panel    Standing Status: Future     Number of Occurrences:      Standing Expiration Date: 09/03/2015    Order Specific Question:  Has the patient fasted?    Answer:  No  . C-reactive protein    Standing Status: Future     Number of Occurrences:      Standing Expiration Date: 09/03/2015  . Magnesium    Standing Status: Future     Number of Occurrences:      Standing Expiration Date: 09/03/2015  . Sedimentation rate    Standing Status: Future     Number of Occurrences:      Standing Expiration Date: 09/03/2015  . Vitamin D pnl(25-hydrxy+1,25-dihy)-bld    Standing Status: Future     Number of Occurrences:      Standing Expiration Date: 09/03/2015  . Ambulatory referral to Psychology    Referral Priority:  Routine    Referral Type:  Psychiatric    Referral Reason:  Specialty Services Required    Referred to Provider:  Lance Coon, PHD  Requested Specialty:  Psychology    Number of Visits Requested:  1    Imaging Ordered: AMB REFERRAL TO PSYCHOLOGY DG HIP UNILAT W OR W/O PELVIS 2-3 VIEWS RIGHT  Interventional Therapies: Scheduled: None at this time. PRN Procedures: None at this time.    Referral(s) or Consult(s): Medical psychology consult for substance use disorder evaluation.  Medications administered during this visit: Kevin Jimenez had no medications administered during this visit.  No  future appointments.  Primary Care Physician: Dorrene German, MD Location: [facility] Note by: Sydnee Levans Laban Emperor, M.D, DABA, DABAPM, DABPM, DABIPP, FIPP

## 2015-08-04 NOTE — Progress Notes (Signed)
Safety precautions to be maintained throughout the outpatient stay will include: orient to surroundings, keep bed in low position, maintain call bell within reach at all times, provide assistance with transfer out of bed and ambulation.  

## 2015-08-04 NOTE — Progress Notes (Signed)
.  kt 

## 2015-08-04 NOTE — Patient Instructions (Signed)

## 2015-08-05 NOTE — Assessment & Plan Note (Signed)
History of alcohol and heroine abuse as well as using his mother's Tylenol # 3 medication.

## 2015-08-09 LAB — COMPLIANCE DRUG ANALYSIS, UR: PDF: 0

## 2015-08-30 ENCOUNTER — Encounter: Payer: Self-pay | Admitting: Pain Medicine

## 2015-08-30 DIAGNOSIS — F1910 Other psychoactive substance abuse, uncomplicated: Secondary | ICD-10-CM

## 2015-08-30 DIAGNOSIS — F1990 Other psychoactive substance use, unspecified, uncomplicated: Secondary | ICD-10-CM | POA: Insufficient documentation

## 2015-08-30 DIAGNOSIS — Z9189 Other specified personal risk factors, not elsewhere classified: Secondary | ICD-10-CM | POA: Insufficient documentation

## 2015-08-30 DIAGNOSIS — R892 Abnormal level of other drugs, medicaments and biological substances in specimens from other organs, systems and tissues: Secondary | ICD-10-CM | POA: Insufficient documentation

## 2015-08-30 NOTE — Progress Notes (Signed)
Quick Note:  NOTE: This forensic urine drug screen (UDS) test was conducted using a state-of-the-art ultra high performance liquid chromatography and mass spectrometry system (UPLC/MS-MS), the most sophisticated and accurate method available. UPLC/MS-MS is 1,000 times more precise and accurate than standard gas chromatography and mass spectrometry (GC/MS). This system can analyze 26 drug categories and 180 drug compounds.  The results of this test came back with unexpected findings. Unreported opioid intake in combination with alcohol. ______

## 2015-12-12 ENCOUNTER — Other Ambulatory Visit: Payer: Self-pay | Admitting: Pain Medicine

## 2015-12-16 NOTE — Telephone Encounter (Signed)
Note: It is the responsibility of all patients to have an up to date list of all the medications that need to be refilled at the time of their appointment.  Pain Management Medication Policy: Medication changes and refills will be conducted during appointments only, after determining if the medical indications for the use of the medication are still present. No fax, phone, or electronic refills will be conducted outside of an evaluation appointment. Patients are encouraged to call and set up an appointment to comply with stipulated policy. 

## 2016-01-08 ENCOUNTER — Other Ambulatory Visit: Payer: Medicaid Other

## 2016-01-21 ENCOUNTER — Encounter: Payer: Self-pay | Admitting: Internal Medicine

## 2016-01-21 ENCOUNTER — Ambulatory Visit (INDEPENDENT_AMBULATORY_CARE_PROVIDER_SITE_OTHER): Payer: Medicare Other | Admitting: Internal Medicine

## 2016-01-21 VITALS — BP 172/121 | HR 88 | Temp 98.2°F | Ht 70.0 in | Wt 118.0 lb

## 2016-01-21 DIAGNOSIS — Z113 Encounter for screening for infections with a predominantly sexual mode of transmission: Secondary | ICD-10-CM | POA: Diagnosis not present

## 2016-01-21 DIAGNOSIS — K7400 Hepatic fibrosis, unspecified: Secondary | ICD-10-CM

## 2016-01-21 DIAGNOSIS — Z720 Tobacco use: Secondary | ICD-10-CM

## 2016-01-21 DIAGNOSIS — B182 Chronic viral hepatitis C: Secondary | ICD-10-CM | POA: Diagnosis present

## 2016-01-21 DIAGNOSIS — B20 Human immunodeficiency virus [HIV] disease: Secondary | ICD-10-CM

## 2016-01-21 LAB — CBC WITH DIFFERENTIAL/PLATELET
BASOS PCT: 3 %
Basophils Absolute: 138 cells/uL (ref 0–200)
EOS PCT: 2 %
Eosinophils Absolute: 92 cells/uL (ref 15–500)
HCT: 46 % (ref 38.5–50.0)
Hemoglobin: 16.2 g/dL (ref 13.2–17.1)
LYMPHS PCT: 41 %
Lymphs Abs: 1886 cells/uL (ref 850–3900)
MCH: 32.6 pg (ref 27.0–33.0)
MCHC: 35.2 g/dL (ref 32.0–36.0)
MCV: 92.6 fL (ref 80.0–100.0)
MONOS PCT: 8 %
MPV: 9.3 fL (ref 7.5–12.5)
Monocytes Absolute: 368 cells/uL (ref 200–950)
NEUTROS ABS: 2116 {cells}/uL (ref 1500–7800)
Neutrophils Relative %: 46 %
PLATELETS: 278 10*3/uL (ref 140–400)
RBC: 4.97 MIL/uL (ref 4.20–5.80)
RDW: 13.2 % (ref 11.0–15.0)
WBC: 4.6 10*3/uL (ref 3.8–10.8)

## 2016-01-21 MED ORDER — LEDIPASVIR-SOFOSBUVIR 90-400 MG PO TABS: 1.0000 | ORAL_TABLET | Freq: Every day | ORAL | 2 refills | Status: DC

## 2016-01-22 DIAGNOSIS — Z720 Tobacco use: Secondary | ICD-10-CM | POA: Insufficient documentation

## 2016-01-22 LAB — COMPLETE METABOLIC PANEL WITH GFR
ALK PHOS: 93 U/L (ref 40–115)
ALT: 94 U/L — AB (ref 9–46)
AST: 247 U/L — ABNORMAL HIGH (ref 10–35)
Albumin: 3.8 g/dL (ref 3.6–5.1)
BILIRUBIN TOTAL: 0.7 mg/dL (ref 0.2–1.2)
BUN: 6 mg/dL — ABNORMAL LOW (ref 7–25)
CALCIUM: 9.1 mg/dL (ref 8.6–10.3)
CO2: 28 mmol/L (ref 20–31)
CREATININE: 0.76 mg/dL (ref 0.70–1.25)
Chloride: 99 mmol/L (ref 98–110)
Glucose, Bld: 89 mg/dL (ref 65–99)
Potassium: 3.8 mmol/L (ref 3.5–5.3)
Sodium: 133 mmol/L — ABNORMAL LOW (ref 135–146)
TOTAL PROTEIN: 9.5 g/dL — AB (ref 6.1–8.1)

## 2016-01-22 LAB — HCV RNA QUANT RFLX ULTRA OR GENOTYP
HCV QUANT LOG: 7.22 {Log} — AB (ref <1.18)
HCV Quantitative: 16579341 IU/mL — ABNORMAL HIGH (ref <15)

## 2016-01-22 LAB — RPR

## 2016-01-22 NOTE — Assessment & Plan Note (Signed)
I counseled him on cessation and he is going to continue efforts at reduction.  Will consider chantix later if needed after hepatitis C treatment.   He had a topical allergic reaction to the nicotine patch

## 2016-01-22 NOTE — Progress Notes (Signed)
CC: Follow up for HIV  Interval history: He was seen as a new patient in November 2016 and has a history of HIV, elite controller with minimal viral load and good CD4 count.  He was seen by Dr. Magnus Ivan of orthopedics because he was interested in hip replacement and was told to get his HIV and hepatitis C treated, under control.  He therefore established with our clinic and I started him on Genvoya and did elastography of his liver.  He took [place] about 10 days and stopped due to n/v.  Elastography with F0/1.  Hepatosteatosis noted.  I then started him on Tivicay and Descovy which he is continuing to take and doing well.  No complaints with this.   For his hepatitis C, he previously had medicaid and sent approval for treatment with Harvoni but was denied due to his minimal fibrosis.  He though now has medicare.   He continues to smoke but is cutting down and trying to quit.    Prior to Admission medications   Medication Sig Start Date End Date Taking? Authorizing Provider  benzonatate (TESSALON) 100 MG capsule Take by mouth 3 (three) times daily as needed for cough. Reported on 07/15/2015    Historical Provider, MD  dolutegravir (TIVICAY) 50 MG tablet Take 1 tablet (50 mg total) by mouth daily. 07/15/15   Gardiner Barefoot, MD  emtricitabine-tenofovir AF (DESCOVY) 200-25 MG tablet Take 1 tablet by mouth daily. 07/15/15   Gardiner Barefoot, MD  lisinopril (PRINIVIL,ZESTRIL) 10 MG tablet Take 10 mg by mouth daily. Reported on 07/15/2015    Historical Provider, MD  meloxicam (MOBIC) 15 MG tablet Take 15 mg by mouth daily. Reported on 07/15/2015    Historical Provider, MD  naproxen sodium (ANAPROX) 220 MG tablet Take 220 mg by mouth 3 (three) times daily with meals. Reported on 07/15/2015    Historical Provider, MD    Review of Systems Constitutional: negative for fatigue and malaise Gastrointestinal: negative for diarrhea and constipation Musculoskeletal: negative for myalgias All other systems reviewed and  are negative   Physical Exam: CONSTITUTIONAL:in no apparent distress  Vitals:   01/21/16 1518  BP: (!) 172/121  Pulse: 88  Temp: 98.2 F (36.8 C)   Eyes: anicteric HENT: no thrush, no cervical lymphadenopathy Respiratory: Normal respiratory effort; CTA B  Lab Results  Component Value Date   HIV1RNAQUANT 147 (H) 05/15/2015   HIV1RNAQUANT 116 (H) 03/27/2015   No components found for: HIV1GENOTYPRPLUS No components found for: THELPERCELL   Social History   Social History  . Marital status: Unknown    Spouse name: N/A  . Number of children: N/A  . Years of education: N/A   Occupational History  . Not on file.   Social History Main Topics  . Smoking status: Current Every Day Smoker    Packs/day: 0.10    Years: 30.00    Types: Cigarettes  . Smokeless tobacco: Never Used  . Alcohol use 3.6 oz/week    6 Cans of beer per week  . Drug use: No     Comment: no heroin  since 2004   . Sexual activity: Not Currently    Partners: Female    Birth control/ protection: Condom   Other Topics Concern  . Not on file   Social History Narrative  . No narrative on file

## 2016-01-22 NOTE — Assessment & Plan Note (Signed)
Labs today.  Hopefully on ARVs his CD4 goes up.  RTC 6 months for this.

## 2016-01-22 NOTE — Assessment & Plan Note (Signed)
I will try again to get him on Harvoni for 12 weeks.  If successful, he can follow up with pharmacy for the duration of treatment. I will start the hepatitis B series next visit with me.  Hepatitis A immune. Had pneumovax

## 2016-01-22 NOTE — Assessment & Plan Note (Signed)
Minimal fibrosis.  Will not need follow up for this.

## 2016-01-23 ENCOUNTER — Telehealth: Payer: Self-pay | Admitting: *Deleted

## 2016-01-23 LAB — HIV-1 RNA QUANT-NO REFLEX-BLD
HIV 1 RNA Quant: 149 copies/mL — ABNORMAL HIGH (ref <20)
HIV-1 RNA Quant, Log: 2.17 Log copies/mL — ABNORMAL HIGH (ref <1.30)

## 2016-01-23 LAB — T-HELPER CELL (CD4) - (RCID CLINIC ONLY)
CD4 % Helper T Cell: 24 % — ABNORMAL LOW (ref 33–55)
CD4 T Cell Abs: 450 /uL (ref 400–2700)

## 2016-01-23 NOTE — Telephone Encounter (Signed)
Lab called with corrected CD4 result for patient 450 and 24%. Kevin Jimenez

## 2016-01-27 LAB — HEPATITIS C GENOTYPE

## 2016-07-20 ENCOUNTER — Ambulatory Visit: Payer: Medicare Other | Admitting: Internal Medicine

## 2016-07-26 DIAGNOSIS — M169 Osteoarthritis of hip, unspecified: Secondary | ICD-10-CM | POA: Diagnosis not present

## 2016-07-26 DIAGNOSIS — I10 Essential (primary) hypertension: Secondary | ICD-10-CM | POA: Diagnosis not present

## 2016-07-26 DIAGNOSIS — J449 Chronic obstructive pulmonary disease, unspecified: Secondary | ICD-10-CM | POA: Diagnosis not present

## 2016-07-27 DIAGNOSIS — I10 Essential (primary) hypertension: Secondary | ICD-10-CM | POA: Diagnosis not present

## 2016-07-27 DIAGNOSIS — M169 Osteoarthritis of hip, unspecified: Secondary | ICD-10-CM | POA: Diagnosis not present

## 2016-07-27 DIAGNOSIS — J449 Chronic obstructive pulmonary disease, unspecified: Secondary | ICD-10-CM | POA: Diagnosis not present

## 2016-07-28 DIAGNOSIS — M169 Osteoarthritis of hip, unspecified: Secondary | ICD-10-CM | POA: Diagnosis not present

## 2016-07-28 DIAGNOSIS — I10 Essential (primary) hypertension: Secondary | ICD-10-CM | POA: Diagnosis not present

## 2016-07-28 DIAGNOSIS — J449 Chronic obstructive pulmonary disease, unspecified: Secondary | ICD-10-CM | POA: Diagnosis not present

## 2016-07-29 DIAGNOSIS — I10 Essential (primary) hypertension: Secondary | ICD-10-CM | POA: Diagnosis not present

## 2016-07-29 DIAGNOSIS — M169 Osteoarthritis of hip, unspecified: Secondary | ICD-10-CM | POA: Diagnosis not present

## 2016-07-29 DIAGNOSIS — J449 Chronic obstructive pulmonary disease, unspecified: Secondary | ICD-10-CM | POA: Diagnosis not present

## 2016-07-30 DIAGNOSIS — I10 Essential (primary) hypertension: Secondary | ICD-10-CM | POA: Diagnosis not present

## 2016-07-30 DIAGNOSIS — J449 Chronic obstructive pulmonary disease, unspecified: Secondary | ICD-10-CM | POA: Diagnosis not present

## 2016-07-30 DIAGNOSIS — M169 Osteoarthritis of hip, unspecified: Secondary | ICD-10-CM | POA: Diagnosis not present

## 2016-07-31 DIAGNOSIS — M169 Osteoarthritis of hip, unspecified: Secondary | ICD-10-CM | POA: Diagnosis not present

## 2016-07-31 DIAGNOSIS — I10 Essential (primary) hypertension: Secondary | ICD-10-CM | POA: Diagnosis not present

## 2016-07-31 DIAGNOSIS — J449 Chronic obstructive pulmonary disease, unspecified: Secondary | ICD-10-CM | POA: Diagnosis not present

## 2016-08-01 DIAGNOSIS — J449 Chronic obstructive pulmonary disease, unspecified: Secondary | ICD-10-CM | POA: Diagnosis not present

## 2016-08-01 DIAGNOSIS — M169 Osteoarthritis of hip, unspecified: Secondary | ICD-10-CM | POA: Diagnosis not present

## 2016-08-01 DIAGNOSIS — I10 Essential (primary) hypertension: Secondary | ICD-10-CM | POA: Diagnosis not present

## 2016-08-02 DIAGNOSIS — J449 Chronic obstructive pulmonary disease, unspecified: Secondary | ICD-10-CM | POA: Diagnosis not present

## 2016-08-02 DIAGNOSIS — M169 Osteoarthritis of hip, unspecified: Secondary | ICD-10-CM | POA: Diagnosis not present

## 2016-08-02 DIAGNOSIS — I10 Essential (primary) hypertension: Secondary | ICD-10-CM | POA: Diagnosis not present

## 2016-08-03 DIAGNOSIS — Z21 Asymptomatic human immunodeficiency virus [HIV] infection status: Secondary | ICD-10-CM | POA: Diagnosis not present

## 2016-08-03 DIAGNOSIS — M169 Osteoarthritis of hip, unspecified: Secondary | ICD-10-CM | POA: Diagnosis not present

## 2016-08-03 DIAGNOSIS — J302 Other seasonal allergic rhinitis: Secondary | ICD-10-CM | POA: Diagnosis not present

## 2016-08-03 DIAGNOSIS — F17200 Nicotine dependence, unspecified, uncomplicated: Secondary | ICD-10-CM | POA: Diagnosis not present

## 2016-08-03 DIAGNOSIS — Z716 Tobacco abuse counseling: Secondary | ICD-10-CM | POA: Diagnosis not present

## 2016-08-03 DIAGNOSIS — I10 Essential (primary) hypertension: Secondary | ICD-10-CM | POA: Diagnosis not present

## 2016-08-03 DIAGNOSIS — J449 Chronic obstructive pulmonary disease, unspecified: Secondary | ICD-10-CM | POA: Diagnosis not present

## 2016-08-04 DIAGNOSIS — M169 Osteoarthritis of hip, unspecified: Secondary | ICD-10-CM | POA: Diagnosis not present

## 2016-08-04 DIAGNOSIS — I10 Essential (primary) hypertension: Secondary | ICD-10-CM | POA: Diagnosis not present

## 2016-08-04 DIAGNOSIS — J449 Chronic obstructive pulmonary disease, unspecified: Secondary | ICD-10-CM | POA: Diagnosis not present

## 2016-08-05 DIAGNOSIS — I10 Essential (primary) hypertension: Secondary | ICD-10-CM | POA: Diagnosis not present

## 2016-08-05 DIAGNOSIS — J449 Chronic obstructive pulmonary disease, unspecified: Secondary | ICD-10-CM | POA: Diagnosis not present

## 2016-08-05 DIAGNOSIS — M169 Osteoarthritis of hip, unspecified: Secondary | ICD-10-CM | POA: Diagnosis not present

## 2016-08-06 DIAGNOSIS — I10 Essential (primary) hypertension: Secondary | ICD-10-CM | POA: Diagnosis not present

## 2016-08-06 DIAGNOSIS — M169 Osteoarthritis of hip, unspecified: Secondary | ICD-10-CM | POA: Diagnosis not present

## 2016-08-06 DIAGNOSIS — J449 Chronic obstructive pulmonary disease, unspecified: Secondary | ICD-10-CM | POA: Diagnosis not present

## 2016-08-07 DIAGNOSIS — I10 Essential (primary) hypertension: Secondary | ICD-10-CM | POA: Diagnosis not present

## 2016-08-07 DIAGNOSIS — J449 Chronic obstructive pulmonary disease, unspecified: Secondary | ICD-10-CM | POA: Diagnosis not present

## 2016-08-07 DIAGNOSIS — M169 Osteoarthritis of hip, unspecified: Secondary | ICD-10-CM | POA: Diagnosis not present

## 2016-08-08 DIAGNOSIS — M169 Osteoarthritis of hip, unspecified: Secondary | ICD-10-CM | POA: Diagnosis not present

## 2016-08-08 DIAGNOSIS — I10 Essential (primary) hypertension: Secondary | ICD-10-CM | POA: Diagnosis not present

## 2016-08-08 DIAGNOSIS — J449 Chronic obstructive pulmonary disease, unspecified: Secondary | ICD-10-CM | POA: Diagnosis not present

## 2016-08-09 DIAGNOSIS — I10 Essential (primary) hypertension: Secondary | ICD-10-CM | POA: Diagnosis not present

## 2016-08-09 DIAGNOSIS — J449 Chronic obstructive pulmonary disease, unspecified: Secondary | ICD-10-CM | POA: Diagnosis not present

## 2016-08-09 DIAGNOSIS — M169 Osteoarthritis of hip, unspecified: Secondary | ICD-10-CM | POA: Diagnosis not present

## 2016-08-10 DIAGNOSIS — M169 Osteoarthritis of hip, unspecified: Secondary | ICD-10-CM | POA: Diagnosis not present

## 2016-08-10 DIAGNOSIS — I10 Essential (primary) hypertension: Secondary | ICD-10-CM | POA: Diagnosis not present

## 2016-08-10 DIAGNOSIS — J449 Chronic obstructive pulmonary disease, unspecified: Secondary | ICD-10-CM | POA: Diagnosis not present

## 2016-08-11 DIAGNOSIS — J449 Chronic obstructive pulmonary disease, unspecified: Secondary | ICD-10-CM | POA: Diagnosis not present

## 2016-08-11 DIAGNOSIS — I10 Essential (primary) hypertension: Secondary | ICD-10-CM | POA: Diagnosis not present

## 2016-08-11 DIAGNOSIS — M169 Osteoarthritis of hip, unspecified: Secondary | ICD-10-CM | POA: Diagnosis not present

## 2016-08-12 DIAGNOSIS — I10 Essential (primary) hypertension: Secondary | ICD-10-CM | POA: Diagnosis not present

## 2016-08-12 DIAGNOSIS — J449 Chronic obstructive pulmonary disease, unspecified: Secondary | ICD-10-CM | POA: Diagnosis not present

## 2016-08-12 DIAGNOSIS — M169 Osteoarthritis of hip, unspecified: Secondary | ICD-10-CM | POA: Diagnosis not present

## 2016-08-13 DIAGNOSIS — I10 Essential (primary) hypertension: Secondary | ICD-10-CM | POA: Diagnosis not present

## 2016-08-13 DIAGNOSIS — J449 Chronic obstructive pulmonary disease, unspecified: Secondary | ICD-10-CM | POA: Diagnosis not present

## 2016-08-13 DIAGNOSIS — M169 Osteoarthritis of hip, unspecified: Secondary | ICD-10-CM | POA: Diagnosis not present

## 2016-08-14 DIAGNOSIS — M169 Osteoarthritis of hip, unspecified: Secondary | ICD-10-CM | POA: Diagnosis not present

## 2016-08-14 DIAGNOSIS — J449 Chronic obstructive pulmonary disease, unspecified: Secondary | ICD-10-CM | POA: Diagnosis not present

## 2016-08-14 DIAGNOSIS — I10 Essential (primary) hypertension: Secondary | ICD-10-CM | POA: Diagnosis not present

## 2016-08-15 DIAGNOSIS — J449 Chronic obstructive pulmonary disease, unspecified: Secondary | ICD-10-CM | POA: Diagnosis not present

## 2016-08-15 DIAGNOSIS — M169 Osteoarthritis of hip, unspecified: Secondary | ICD-10-CM | POA: Diagnosis not present

## 2016-08-15 DIAGNOSIS — I10 Essential (primary) hypertension: Secondary | ICD-10-CM | POA: Diagnosis not present

## 2016-08-16 DIAGNOSIS — M169 Osteoarthritis of hip, unspecified: Secondary | ICD-10-CM | POA: Diagnosis not present

## 2016-08-16 DIAGNOSIS — I10 Essential (primary) hypertension: Secondary | ICD-10-CM | POA: Diagnosis not present

## 2016-08-16 DIAGNOSIS — J449 Chronic obstructive pulmonary disease, unspecified: Secondary | ICD-10-CM | POA: Diagnosis not present

## 2016-08-17 DIAGNOSIS — M169 Osteoarthritis of hip, unspecified: Secondary | ICD-10-CM | POA: Diagnosis not present

## 2016-08-17 DIAGNOSIS — I10 Essential (primary) hypertension: Secondary | ICD-10-CM | POA: Diagnosis not present

## 2016-08-17 DIAGNOSIS — J449 Chronic obstructive pulmonary disease, unspecified: Secondary | ICD-10-CM | POA: Diagnosis not present

## 2016-08-18 DIAGNOSIS — M169 Osteoarthritis of hip, unspecified: Secondary | ICD-10-CM | POA: Diagnosis not present

## 2016-08-18 DIAGNOSIS — I10 Essential (primary) hypertension: Secondary | ICD-10-CM | POA: Diagnosis not present

## 2016-08-18 DIAGNOSIS — J449 Chronic obstructive pulmonary disease, unspecified: Secondary | ICD-10-CM | POA: Diagnosis not present

## 2016-08-19 DIAGNOSIS — I10 Essential (primary) hypertension: Secondary | ICD-10-CM | POA: Diagnosis not present

## 2016-08-19 DIAGNOSIS — J449 Chronic obstructive pulmonary disease, unspecified: Secondary | ICD-10-CM | POA: Diagnosis not present

## 2016-08-19 DIAGNOSIS — M169 Osteoarthritis of hip, unspecified: Secondary | ICD-10-CM | POA: Diagnosis not present

## 2016-08-21 DIAGNOSIS — I10 Essential (primary) hypertension: Secondary | ICD-10-CM | POA: Diagnosis not present

## 2016-08-21 DIAGNOSIS — J449 Chronic obstructive pulmonary disease, unspecified: Secondary | ICD-10-CM | POA: Diagnosis not present

## 2016-08-21 DIAGNOSIS — M169 Osteoarthritis of hip, unspecified: Secondary | ICD-10-CM | POA: Diagnosis not present

## 2016-08-22 DIAGNOSIS — J449 Chronic obstructive pulmonary disease, unspecified: Secondary | ICD-10-CM | POA: Diagnosis not present

## 2016-08-22 DIAGNOSIS — I10 Essential (primary) hypertension: Secondary | ICD-10-CM | POA: Diagnosis not present

## 2016-08-22 DIAGNOSIS — M169 Osteoarthritis of hip, unspecified: Secondary | ICD-10-CM | POA: Diagnosis not present

## 2016-08-23 DIAGNOSIS — J449 Chronic obstructive pulmonary disease, unspecified: Secondary | ICD-10-CM | POA: Diagnosis not present

## 2016-08-23 DIAGNOSIS — M169 Osteoarthritis of hip, unspecified: Secondary | ICD-10-CM | POA: Diagnosis not present

## 2016-08-23 DIAGNOSIS — I10 Essential (primary) hypertension: Secondary | ICD-10-CM | POA: Diagnosis not present

## 2016-08-24 DIAGNOSIS — I10 Essential (primary) hypertension: Secondary | ICD-10-CM | POA: Diagnosis not present

## 2016-08-24 DIAGNOSIS — M169 Osteoarthritis of hip, unspecified: Secondary | ICD-10-CM | POA: Diagnosis not present

## 2016-08-24 DIAGNOSIS — J449 Chronic obstructive pulmonary disease, unspecified: Secondary | ICD-10-CM | POA: Diagnosis not present

## 2016-08-25 DIAGNOSIS — J449 Chronic obstructive pulmonary disease, unspecified: Secondary | ICD-10-CM | POA: Diagnosis not present

## 2016-08-25 DIAGNOSIS — I10 Essential (primary) hypertension: Secondary | ICD-10-CM | POA: Diagnosis not present

## 2016-08-25 DIAGNOSIS — M169 Osteoarthritis of hip, unspecified: Secondary | ICD-10-CM | POA: Diagnosis not present

## 2016-08-26 DIAGNOSIS — J449 Chronic obstructive pulmonary disease, unspecified: Secondary | ICD-10-CM | POA: Diagnosis not present

## 2016-08-26 DIAGNOSIS — M169 Osteoarthritis of hip, unspecified: Secondary | ICD-10-CM | POA: Diagnosis not present

## 2016-08-26 DIAGNOSIS — I10 Essential (primary) hypertension: Secondary | ICD-10-CM | POA: Diagnosis not present

## 2016-08-27 DIAGNOSIS — I10 Essential (primary) hypertension: Secondary | ICD-10-CM | POA: Diagnosis not present

## 2016-08-27 DIAGNOSIS — M169 Osteoarthritis of hip, unspecified: Secondary | ICD-10-CM | POA: Diagnosis not present

## 2016-08-27 DIAGNOSIS — J449 Chronic obstructive pulmonary disease, unspecified: Secondary | ICD-10-CM | POA: Diagnosis not present

## 2016-08-28 DIAGNOSIS — J449 Chronic obstructive pulmonary disease, unspecified: Secondary | ICD-10-CM | POA: Diagnosis not present

## 2016-08-28 DIAGNOSIS — M169 Osteoarthritis of hip, unspecified: Secondary | ICD-10-CM | POA: Diagnosis not present

## 2016-08-28 DIAGNOSIS — I10 Essential (primary) hypertension: Secondary | ICD-10-CM | POA: Diagnosis not present

## 2016-08-29 DIAGNOSIS — J449 Chronic obstructive pulmonary disease, unspecified: Secondary | ICD-10-CM | POA: Diagnosis not present

## 2016-08-29 DIAGNOSIS — M169 Osteoarthritis of hip, unspecified: Secondary | ICD-10-CM | POA: Diagnosis not present

## 2016-08-29 DIAGNOSIS — I10 Essential (primary) hypertension: Secondary | ICD-10-CM | POA: Diagnosis not present

## 2016-08-30 DIAGNOSIS — M169 Osteoarthritis of hip, unspecified: Secondary | ICD-10-CM | POA: Diagnosis not present

## 2016-08-30 DIAGNOSIS — J449 Chronic obstructive pulmonary disease, unspecified: Secondary | ICD-10-CM | POA: Diagnosis not present

## 2016-08-30 DIAGNOSIS — I10 Essential (primary) hypertension: Secondary | ICD-10-CM | POA: Diagnosis not present

## 2016-08-31 DIAGNOSIS — I10 Essential (primary) hypertension: Secondary | ICD-10-CM | POA: Diagnosis not present

## 2016-08-31 DIAGNOSIS — M169 Osteoarthritis of hip, unspecified: Secondary | ICD-10-CM | POA: Diagnosis not present

## 2016-08-31 DIAGNOSIS — J449 Chronic obstructive pulmonary disease, unspecified: Secondary | ICD-10-CM | POA: Diagnosis not present

## 2016-09-01 DIAGNOSIS — J449 Chronic obstructive pulmonary disease, unspecified: Secondary | ICD-10-CM | POA: Diagnosis not present

## 2016-09-01 DIAGNOSIS — M169 Osteoarthritis of hip, unspecified: Secondary | ICD-10-CM | POA: Diagnosis not present

## 2016-09-01 DIAGNOSIS — I10 Essential (primary) hypertension: Secondary | ICD-10-CM | POA: Diagnosis not present

## 2016-09-02 DIAGNOSIS — J449 Chronic obstructive pulmonary disease, unspecified: Secondary | ICD-10-CM | POA: Diagnosis not present

## 2016-09-02 DIAGNOSIS — M169 Osteoarthritis of hip, unspecified: Secondary | ICD-10-CM | POA: Diagnosis not present

## 2016-09-02 DIAGNOSIS — I10 Essential (primary) hypertension: Secondary | ICD-10-CM | POA: Diagnosis not present

## 2016-09-03 DIAGNOSIS — J449 Chronic obstructive pulmonary disease, unspecified: Secondary | ICD-10-CM | POA: Diagnosis not present

## 2016-09-03 DIAGNOSIS — I10 Essential (primary) hypertension: Secondary | ICD-10-CM | POA: Diagnosis not present

## 2016-09-03 DIAGNOSIS — M169 Osteoarthritis of hip, unspecified: Secondary | ICD-10-CM | POA: Diagnosis not present

## 2016-09-04 DIAGNOSIS — I10 Essential (primary) hypertension: Secondary | ICD-10-CM | POA: Diagnosis not present

## 2016-09-04 DIAGNOSIS — M169 Osteoarthritis of hip, unspecified: Secondary | ICD-10-CM | POA: Diagnosis not present

## 2016-09-04 DIAGNOSIS — J449 Chronic obstructive pulmonary disease, unspecified: Secondary | ICD-10-CM | POA: Diagnosis not present

## 2016-09-05 DIAGNOSIS — M169 Osteoarthritis of hip, unspecified: Secondary | ICD-10-CM | POA: Diagnosis not present

## 2016-09-05 DIAGNOSIS — I10 Essential (primary) hypertension: Secondary | ICD-10-CM | POA: Diagnosis not present

## 2016-09-05 DIAGNOSIS — J449 Chronic obstructive pulmonary disease, unspecified: Secondary | ICD-10-CM | POA: Diagnosis not present

## 2016-09-06 DIAGNOSIS — M169 Osteoarthritis of hip, unspecified: Secondary | ICD-10-CM | POA: Diagnosis not present

## 2016-09-06 DIAGNOSIS — J449 Chronic obstructive pulmonary disease, unspecified: Secondary | ICD-10-CM | POA: Diagnosis not present

## 2016-09-06 DIAGNOSIS — I10 Essential (primary) hypertension: Secondary | ICD-10-CM | POA: Diagnosis not present

## 2016-09-07 DIAGNOSIS — M169 Osteoarthritis of hip, unspecified: Secondary | ICD-10-CM | POA: Diagnosis not present

## 2016-09-07 DIAGNOSIS — I10 Essential (primary) hypertension: Secondary | ICD-10-CM | POA: Diagnosis not present

## 2016-09-07 DIAGNOSIS — J449 Chronic obstructive pulmonary disease, unspecified: Secondary | ICD-10-CM | POA: Diagnosis not present

## 2016-09-08 DIAGNOSIS — M169 Osteoarthritis of hip, unspecified: Secondary | ICD-10-CM | POA: Diagnosis not present

## 2016-09-08 DIAGNOSIS — J449 Chronic obstructive pulmonary disease, unspecified: Secondary | ICD-10-CM | POA: Diagnosis not present

## 2016-09-08 DIAGNOSIS — I10 Essential (primary) hypertension: Secondary | ICD-10-CM | POA: Diagnosis not present

## 2016-09-09 DIAGNOSIS — M169 Osteoarthritis of hip, unspecified: Secondary | ICD-10-CM | POA: Diagnosis not present

## 2016-09-09 DIAGNOSIS — J449 Chronic obstructive pulmonary disease, unspecified: Secondary | ICD-10-CM | POA: Diagnosis not present

## 2016-09-09 DIAGNOSIS — I10 Essential (primary) hypertension: Secondary | ICD-10-CM | POA: Diagnosis not present

## 2016-09-10 DIAGNOSIS — J449 Chronic obstructive pulmonary disease, unspecified: Secondary | ICD-10-CM | POA: Diagnosis not present

## 2016-09-10 DIAGNOSIS — I10 Essential (primary) hypertension: Secondary | ICD-10-CM | POA: Diagnosis not present

## 2016-09-10 DIAGNOSIS — M169 Osteoarthritis of hip, unspecified: Secondary | ICD-10-CM | POA: Diagnosis not present

## 2016-09-11 DIAGNOSIS — J449 Chronic obstructive pulmonary disease, unspecified: Secondary | ICD-10-CM | POA: Diagnosis not present

## 2016-09-11 DIAGNOSIS — I10 Essential (primary) hypertension: Secondary | ICD-10-CM | POA: Diagnosis not present

## 2016-09-11 DIAGNOSIS — M169 Osteoarthritis of hip, unspecified: Secondary | ICD-10-CM | POA: Diagnosis not present

## 2016-09-12 DIAGNOSIS — I10 Essential (primary) hypertension: Secondary | ICD-10-CM | POA: Diagnosis not present

## 2016-09-12 DIAGNOSIS — J449 Chronic obstructive pulmonary disease, unspecified: Secondary | ICD-10-CM | POA: Diagnosis not present

## 2016-09-12 DIAGNOSIS — M169 Osteoarthritis of hip, unspecified: Secondary | ICD-10-CM | POA: Diagnosis not present

## 2016-09-13 DIAGNOSIS — I10 Essential (primary) hypertension: Secondary | ICD-10-CM | POA: Diagnosis not present

## 2016-09-13 DIAGNOSIS — M169 Osteoarthritis of hip, unspecified: Secondary | ICD-10-CM | POA: Diagnosis not present

## 2016-09-13 DIAGNOSIS — J449 Chronic obstructive pulmonary disease, unspecified: Secondary | ICD-10-CM | POA: Diagnosis not present

## 2016-09-14 DIAGNOSIS — M169 Osteoarthritis of hip, unspecified: Secondary | ICD-10-CM | POA: Diagnosis not present

## 2016-09-14 DIAGNOSIS — I10 Essential (primary) hypertension: Secondary | ICD-10-CM | POA: Diagnosis not present

## 2016-09-14 DIAGNOSIS — J449 Chronic obstructive pulmonary disease, unspecified: Secondary | ICD-10-CM | POA: Diagnosis not present

## 2016-09-15 DIAGNOSIS — M169 Osteoarthritis of hip, unspecified: Secondary | ICD-10-CM | POA: Diagnosis not present

## 2016-09-15 DIAGNOSIS — J449 Chronic obstructive pulmonary disease, unspecified: Secondary | ICD-10-CM | POA: Diagnosis not present

## 2016-09-15 DIAGNOSIS — I10 Essential (primary) hypertension: Secondary | ICD-10-CM | POA: Diagnosis not present

## 2016-09-16 DIAGNOSIS — I10 Essential (primary) hypertension: Secondary | ICD-10-CM | POA: Diagnosis not present

## 2016-09-16 DIAGNOSIS — M169 Osteoarthritis of hip, unspecified: Secondary | ICD-10-CM | POA: Diagnosis not present

## 2016-09-16 DIAGNOSIS — J449 Chronic obstructive pulmonary disease, unspecified: Secondary | ICD-10-CM | POA: Diagnosis not present

## 2016-09-17 DIAGNOSIS — I10 Essential (primary) hypertension: Secondary | ICD-10-CM | POA: Diagnosis not present

## 2016-09-17 DIAGNOSIS — M169 Osteoarthritis of hip, unspecified: Secondary | ICD-10-CM | POA: Diagnosis not present

## 2016-09-17 DIAGNOSIS — J449 Chronic obstructive pulmonary disease, unspecified: Secondary | ICD-10-CM | POA: Diagnosis not present

## 2016-09-18 DIAGNOSIS — J449 Chronic obstructive pulmonary disease, unspecified: Secondary | ICD-10-CM | POA: Diagnosis not present

## 2016-09-18 DIAGNOSIS — I10 Essential (primary) hypertension: Secondary | ICD-10-CM | POA: Diagnosis not present

## 2016-09-18 DIAGNOSIS — M169 Osteoarthritis of hip, unspecified: Secondary | ICD-10-CM | POA: Diagnosis not present

## 2016-09-19 DIAGNOSIS — I10 Essential (primary) hypertension: Secondary | ICD-10-CM | POA: Diagnosis not present

## 2016-09-19 DIAGNOSIS — J449 Chronic obstructive pulmonary disease, unspecified: Secondary | ICD-10-CM | POA: Diagnosis not present

## 2016-09-19 DIAGNOSIS — M169 Osteoarthritis of hip, unspecified: Secondary | ICD-10-CM | POA: Diagnosis not present

## 2016-09-20 DIAGNOSIS — J449 Chronic obstructive pulmonary disease, unspecified: Secondary | ICD-10-CM | POA: Diagnosis not present

## 2016-09-20 DIAGNOSIS — M169 Osteoarthritis of hip, unspecified: Secondary | ICD-10-CM | POA: Diagnosis not present

## 2016-09-20 DIAGNOSIS — I10 Essential (primary) hypertension: Secondary | ICD-10-CM | POA: Diagnosis not present

## 2016-09-21 DIAGNOSIS — J449 Chronic obstructive pulmonary disease, unspecified: Secondary | ICD-10-CM | POA: Diagnosis not present

## 2016-09-21 DIAGNOSIS — M169 Osteoarthritis of hip, unspecified: Secondary | ICD-10-CM | POA: Diagnosis not present

## 2016-09-21 DIAGNOSIS — I10 Essential (primary) hypertension: Secondary | ICD-10-CM | POA: Diagnosis not present

## 2016-09-22 DIAGNOSIS — M169 Osteoarthritis of hip, unspecified: Secondary | ICD-10-CM | POA: Diagnosis not present

## 2016-09-22 DIAGNOSIS — I10 Essential (primary) hypertension: Secondary | ICD-10-CM | POA: Diagnosis not present

## 2016-09-22 DIAGNOSIS — J449 Chronic obstructive pulmonary disease, unspecified: Secondary | ICD-10-CM | POA: Diagnosis not present

## 2016-09-23 DIAGNOSIS — M169 Osteoarthritis of hip, unspecified: Secondary | ICD-10-CM | POA: Diagnosis not present

## 2016-09-23 DIAGNOSIS — I10 Essential (primary) hypertension: Secondary | ICD-10-CM | POA: Diagnosis not present

## 2016-09-23 DIAGNOSIS — J449 Chronic obstructive pulmonary disease, unspecified: Secondary | ICD-10-CM | POA: Diagnosis not present

## 2016-09-24 DIAGNOSIS — M169 Osteoarthritis of hip, unspecified: Secondary | ICD-10-CM | POA: Diagnosis not present

## 2016-09-24 DIAGNOSIS — J449 Chronic obstructive pulmonary disease, unspecified: Secondary | ICD-10-CM | POA: Diagnosis not present

## 2016-09-24 DIAGNOSIS — I10 Essential (primary) hypertension: Secondary | ICD-10-CM | POA: Diagnosis not present

## 2016-09-25 DIAGNOSIS — J449 Chronic obstructive pulmonary disease, unspecified: Secondary | ICD-10-CM | POA: Diagnosis not present

## 2016-09-25 DIAGNOSIS — M169 Osteoarthritis of hip, unspecified: Secondary | ICD-10-CM | POA: Diagnosis not present

## 2016-09-25 DIAGNOSIS — I10 Essential (primary) hypertension: Secondary | ICD-10-CM | POA: Diagnosis not present

## 2016-09-26 DIAGNOSIS — M169 Osteoarthritis of hip, unspecified: Secondary | ICD-10-CM | POA: Diagnosis not present

## 2016-09-26 DIAGNOSIS — J449 Chronic obstructive pulmonary disease, unspecified: Secondary | ICD-10-CM | POA: Diagnosis not present

## 2016-09-26 DIAGNOSIS — I10 Essential (primary) hypertension: Secondary | ICD-10-CM | POA: Diagnosis not present

## 2016-09-27 DIAGNOSIS — I10 Essential (primary) hypertension: Secondary | ICD-10-CM | POA: Diagnosis not present

## 2016-09-27 DIAGNOSIS — J449 Chronic obstructive pulmonary disease, unspecified: Secondary | ICD-10-CM | POA: Diagnosis not present

## 2016-09-27 DIAGNOSIS — M169 Osteoarthritis of hip, unspecified: Secondary | ICD-10-CM | POA: Diagnosis not present

## 2016-09-28 DIAGNOSIS — J449 Chronic obstructive pulmonary disease, unspecified: Secondary | ICD-10-CM | POA: Diagnosis not present

## 2016-09-28 DIAGNOSIS — I10 Essential (primary) hypertension: Secondary | ICD-10-CM | POA: Diagnosis not present

## 2016-09-28 DIAGNOSIS — M169 Osteoarthritis of hip, unspecified: Secondary | ICD-10-CM | POA: Diagnosis not present

## 2016-09-29 DIAGNOSIS — M169 Osteoarthritis of hip, unspecified: Secondary | ICD-10-CM | POA: Diagnosis not present

## 2016-09-29 DIAGNOSIS — J449 Chronic obstructive pulmonary disease, unspecified: Secondary | ICD-10-CM | POA: Diagnosis not present

## 2016-09-29 DIAGNOSIS — I10 Essential (primary) hypertension: Secondary | ICD-10-CM | POA: Diagnosis not present

## 2016-09-30 DIAGNOSIS — J449 Chronic obstructive pulmonary disease, unspecified: Secondary | ICD-10-CM | POA: Diagnosis not present

## 2016-09-30 DIAGNOSIS — M169 Osteoarthritis of hip, unspecified: Secondary | ICD-10-CM | POA: Diagnosis not present

## 2016-09-30 DIAGNOSIS — I10 Essential (primary) hypertension: Secondary | ICD-10-CM | POA: Diagnosis not present

## 2016-10-01 DIAGNOSIS — I10 Essential (primary) hypertension: Secondary | ICD-10-CM | POA: Diagnosis not present

## 2016-10-01 DIAGNOSIS — J449 Chronic obstructive pulmonary disease, unspecified: Secondary | ICD-10-CM | POA: Diagnosis not present

## 2016-10-01 DIAGNOSIS — M169 Osteoarthritis of hip, unspecified: Secondary | ICD-10-CM | POA: Diagnosis not present

## 2016-10-02 DIAGNOSIS — J449 Chronic obstructive pulmonary disease, unspecified: Secondary | ICD-10-CM | POA: Diagnosis not present

## 2016-10-02 DIAGNOSIS — M169 Osteoarthritis of hip, unspecified: Secondary | ICD-10-CM | POA: Diagnosis not present

## 2016-10-02 DIAGNOSIS — I10 Essential (primary) hypertension: Secondary | ICD-10-CM | POA: Diagnosis not present

## 2016-10-03 DIAGNOSIS — I10 Essential (primary) hypertension: Secondary | ICD-10-CM | POA: Diagnosis not present

## 2016-10-03 DIAGNOSIS — M169 Osteoarthritis of hip, unspecified: Secondary | ICD-10-CM | POA: Diagnosis not present

## 2016-10-03 DIAGNOSIS — J449 Chronic obstructive pulmonary disease, unspecified: Secondary | ICD-10-CM | POA: Diagnosis not present

## 2016-10-04 DIAGNOSIS — I10 Essential (primary) hypertension: Secondary | ICD-10-CM | POA: Diagnosis not present

## 2016-10-04 DIAGNOSIS — M169 Osteoarthritis of hip, unspecified: Secondary | ICD-10-CM | POA: Diagnosis not present

## 2016-10-04 DIAGNOSIS — J449 Chronic obstructive pulmonary disease, unspecified: Secondary | ICD-10-CM | POA: Diagnosis not present

## 2016-10-05 DIAGNOSIS — M169 Osteoarthritis of hip, unspecified: Secondary | ICD-10-CM | POA: Diagnosis not present

## 2016-10-05 DIAGNOSIS — I10 Essential (primary) hypertension: Secondary | ICD-10-CM | POA: Diagnosis not present

## 2016-10-05 DIAGNOSIS — J449 Chronic obstructive pulmonary disease, unspecified: Secondary | ICD-10-CM | POA: Diagnosis not present

## 2016-10-06 DIAGNOSIS — J449 Chronic obstructive pulmonary disease, unspecified: Secondary | ICD-10-CM | POA: Diagnosis not present

## 2016-10-06 DIAGNOSIS — I10 Essential (primary) hypertension: Secondary | ICD-10-CM | POA: Diagnosis not present

## 2016-10-06 DIAGNOSIS — M169 Osteoarthritis of hip, unspecified: Secondary | ICD-10-CM | POA: Diagnosis not present

## 2016-10-07 DIAGNOSIS — I10 Essential (primary) hypertension: Secondary | ICD-10-CM | POA: Diagnosis not present

## 2016-10-07 DIAGNOSIS — J449 Chronic obstructive pulmonary disease, unspecified: Secondary | ICD-10-CM | POA: Diagnosis not present

## 2016-10-07 DIAGNOSIS — M169 Osteoarthritis of hip, unspecified: Secondary | ICD-10-CM | POA: Diagnosis not present

## 2016-10-08 DIAGNOSIS — J449 Chronic obstructive pulmonary disease, unspecified: Secondary | ICD-10-CM | POA: Diagnosis not present

## 2016-10-08 DIAGNOSIS — M169 Osteoarthritis of hip, unspecified: Secondary | ICD-10-CM | POA: Diagnosis not present

## 2016-10-08 DIAGNOSIS — I10 Essential (primary) hypertension: Secondary | ICD-10-CM | POA: Diagnosis not present

## 2016-10-09 DIAGNOSIS — I10 Essential (primary) hypertension: Secondary | ICD-10-CM | POA: Diagnosis not present

## 2016-10-09 DIAGNOSIS — M169 Osteoarthritis of hip, unspecified: Secondary | ICD-10-CM | POA: Diagnosis not present

## 2016-10-09 DIAGNOSIS — J449 Chronic obstructive pulmonary disease, unspecified: Secondary | ICD-10-CM | POA: Diagnosis not present

## 2016-10-10 DIAGNOSIS — M169 Osteoarthritis of hip, unspecified: Secondary | ICD-10-CM | POA: Diagnosis not present

## 2016-10-10 DIAGNOSIS — J449 Chronic obstructive pulmonary disease, unspecified: Secondary | ICD-10-CM | POA: Diagnosis not present

## 2016-10-10 DIAGNOSIS — I10 Essential (primary) hypertension: Secondary | ICD-10-CM | POA: Diagnosis not present

## 2016-10-11 DIAGNOSIS — J449 Chronic obstructive pulmonary disease, unspecified: Secondary | ICD-10-CM | POA: Diagnosis not present

## 2016-10-11 DIAGNOSIS — I10 Essential (primary) hypertension: Secondary | ICD-10-CM | POA: Diagnosis not present

## 2016-10-11 DIAGNOSIS — M169 Osteoarthritis of hip, unspecified: Secondary | ICD-10-CM | POA: Diagnosis not present

## 2016-10-12 DIAGNOSIS — I10 Essential (primary) hypertension: Secondary | ICD-10-CM | POA: Diagnosis not present

## 2016-10-12 DIAGNOSIS — M169 Osteoarthritis of hip, unspecified: Secondary | ICD-10-CM | POA: Diagnosis not present

## 2016-10-12 DIAGNOSIS — J449 Chronic obstructive pulmonary disease, unspecified: Secondary | ICD-10-CM | POA: Diagnosis not present

## 2016-10-13 DIAGNOSIS — J449 Chronic obstructive pulmonary disease, unspecified: Secondary | ICD-10-CM | POA: Diagnosis not present

## 2016-10-13 DIAGNOSIS — M169 Osteoarthritis of hip, unspecified: Secondary | ICD-10-CM | POA: Diagnosis not present

## 2016-10-13 DIAGNOSIS — I10 Essential (primary) hypertension: Secondary | ICD-10-CM | POA: Diagnosis not present

## 2016-10-14 DIAGNOSIS — I10 Essential (primary) hypertension: Secondary | ICD-10-CM | POA: Diagnosis not present

## 2016-10-14 DIAGNOSIS — J449 Chronic obstructive pulmonary disease, unspecified: Secondary | ICD-10-CM | POA: Diagnosis not present

## 2016-10-14 DIAGNOSIS — M169 Osteoarthritis of hip, unspecified: Secondary | ICD-10-CM | POA: Diagnosis not present

## 2016-10-15 DIAGNOSIS — M169 Osteoarthritis of hip, unspecified: Secondary | ICD-10-CM | POA: Diagnosis not present

## 2016-10-15 DIAGNOSIS — J449 Chronic obstructive pulmonary disease, unspecified: Secondary | ICD-10-CM | POA: Diagnosis not present

## 2016-10-15 DIAGNOSIS — I10 Essential (primary) hypertension: Secondary | ICD-10-CM | POA: Diagnosis not present

## 2016-10-16 DIAGNOSIS — I10 Essential (primary) hypertension: Secondary | ICD-10-CM | POA: Diagnosis not present

## 2016-10-16 DIAGNOSIS — M169 Osteoarthritis of hip, unspecified: Secondary | ICD-10-CM | POA: Diagnosis not present

## 2016-10-16 DIAGNOSIS — J449 Chronic obstructive pulmonary disease, unspecified: Secondary | ICD-10-CM | POA: Diagnosis not present

## 2016-10-17 DIAGNOSIS — I10 Essential (primary) hypertension: Secondary | ICD-10-CM | POA: Diagnosis not present

## 2016-10-17 DIAGNOSIS — J449 Chronic obstructive pulmonary disease, unspecified: Secondary | ICD-10-CM | POA: Diagnosis not present

## 2016-10-17 DIAGNOSIS — M169 Osteoarthritis of hip, unspecified: Secondary | ICD-10-CM | POA: Diagnosis not present

## 2016-10-25 DIAGNOSIS — M169 Osteoarthritis of hip, unspecified: Secondary | ICD-10-CM | POA: Diagnosis not present

## 2016-10-25 DIAGNOSIS — J449 Chronic obstructive pulmonary disease, unspecified: Secondary | ICD-10-CM | POA: Diagnosis not present

## 2016-10-25 DIAGNOSIS — I10 Essential (primary) hypertension: Secondary | ICD-10-CM | POA: Diagnosis not present

## 2016-10-26 DIAGNOSIS — M169 Osteoarthritis of hip, unspecified: Secondary | ICD-10-CM | POA: Diagnosis not present

## 2016-10-26 DIAGNOSIS — J449 Chronic obstructive pulmonary disease, unspecified: Secondary | ICD-10-CM | POA: Diagnosis not present

## 2016-10-26 DIAGNOSIS — I10 Essential (primary) hypertension: Secondary | ICD-10-CM | POA: Diagnosis not present

## 2016-10-27 DIAGNOSIS — M169 Osteoarthritis of hip, unspecified: Secondary | ICD-10-CM | POA: Diagnosis not present

## 2016-10-27 DIAGNOSIS — I10 Essential (primary) hypertension: Secondary | ICD-10-CM | POA: Diagnosis not present

## 2016-10-27 DIAGNOSIS — J449 Chronic obstructive pulmonary disease, unspecified: Secondary | ICD-10-CM | POA: Diagnosis not present

## 2016-10-28 DIAGNOSIS — I10 Essential (primary) hypertension: Secondary | ICD-10-CM | POA: Diagnosis not present

## 2016-10-28 DIAGNOSIS — J449 Chronic obstructive pulmonary disease, unspecified: Secondary | ICD-10-CM | POA: Diagnosis not present

## 2016-10-28 DIAGNOSIS — M169 Osteoarthritis of hip, unspecified: Secondary | ICD-10-CM | POA: Diagnosis not present

## 2016-10-29 DIAGNOSIS — J449 Chronic obstructive pulmonary disease, unspecified: Secondary | ICD-10-CM | POA: Diagnosis not present

## 2016-10-29 DIAGNOSIS — M169 Osteoarthritis of hip, unspecified: Secondary | ICD-10-CM | POA: Diagnosis not present

## 2016-10-29 DIAGNOSIS — I10 Essential (primary) hypertension: Secondary | ICD-10-CM | POA: Diagnosis not present

## 2016-10-30 DIAGNOSIS — J449 Chronic obstructive pulmonary disease, unspecified: Secondary | ICD-10-CM | POA: Diagnosis not present

## 2016-10-30 DIAGNOSIS — M169 Osteoarthritis of hip, unspecified: Secondary | ICD-10-CM | POA: Diagnosis not present

## 2016-10-30 DIAGNOSIS — I10 Essential (primary) hypertension: Secondary | ICD-10-CM | POA: Diagnosis not present

## 2016-10-31 DIAGNOSIS — M169 Osteoarthritis of hip, unspecified: Secondary | ICD-10-CM | POA: Diagnosis not present

## 2016-10-31 DIAGNOSIS — J449 Chronic obstructive pulmonary disease, unspecified: Secondary | ICD-10-CM | POA: Diagnosis not present

## 2016-10-31 DIAGNOSIS — I10 Essential (primary) hypertension: Secondary | ICD-10-CM | POA: Diagnosis not present

## 2016-11-01 DIAGNOSIS — M169 Osteoarthritis of hip, unspecified: Secondary | ICD-10-CM | POA: Diagnosis not present

## 2016-11-01 DIAGNOSIS — J449 Chronic obstructive pulmonary disease, unspecified: Secondary | ICD-10-CM | POA: Diagnosis not present

## 2016-11-01 DIAGNOSIS — I10 Essential (primary) hypertension: Secondary | ICD-10-CM | POA: Diagnosis not present

## 2016-11-02 DIAGNOSIS — I10 Essential (primary) hypertension: Secondary | ICD-10-CM | POA: Diagnosis not present

## 2016-11-02 DIAGNOSIS — J449 Chronic obstructive pulmonary disease, unspecified: Secondary | ICD-10-CM | POA: Diagnosis not present

## 2016-11-02 DIAGNOSIS — M169 Osteoarthritis of hip, unspecified: Secondary | ICD-10-CM | POA: Diagnosis not present

## 2016-11-03 DIAGNOSIS — I10 Essential (primary) hypertension: Secondary | ICD-10-CM | POA: Diagnosis not present

## 2016-11-03 DIAGNOSIS — M169 Osteoarthritis of hip, unspecified: Secondary | ICD-10-CM | POA: Diagnosis not present

## 2016-11-03 DIAGNOSIS — J449 Chronic obstructive pulmonary disease, unspecified: Secondary | ICD-10-CM | POA: Diagnosis not present

## 2016-11-04 DIAGNOSIS — J449 Chronic obstructive pulmonary disease, unspecified: Secondary | ICD-10-CM | POA: Diagnosis not present

## 2016-11-04 DIAGNOSIS — M169 Osteoarthritis of hip, unspecified: Secondary | ICD-10-CM | POA: Diagnosis not present

## 2016-11-04 DIAGNOSIS — I10 Essential (primary) hypertension: Secondary | ICD-10-CM | POA: Diagnosis not present

## 2016-11-05 DIAGNOSIS — I10 Essential (primary) hypertension: Secondary | ICD-10-CM | POA: Diagnosis not present

## 2016-11-05 DIAGNOSIS — J449 Chronic obstructive pulmonary disease, unspecified: Secondary | ICD-10-CM | POA: Diagnosis not present

## 2016-11-05 DIAGNOSIS — M169 Osteoarthritis of hip, unspecified: Secondary | ICD-10-CM | POA: Diagnosis not present

## 2016-11-06 DIAGNOSIS — J449 Chronic obstructive pulmonary disease, unspecified: Secondary | ICD-10-CM | POA: Diagnosis not present

## 2016-11-06 DIAGNOSIS — I10 Essential (primary) hypertension: Secondary | ICD-10-CM | POA: Diagnosis not present

## 2016-11-06 DIAGNOSIS — M169 Osteoarthritis of hip, unspecified: Secondary | ICD-10-CM | POA: Diagnosis not present

## 2016-11-07 DIAGNOSIS — J449 Chronic obstructive pulmonary disease, unspecified: Secondary | ICD-10-CM | POA: Diagnosis not present

## 2016-11-07 DIAGNOSIS — M169 Osteoarthritis of hip, unspecified: Secondary | ICD-10-CM | POA: Diagnosis not present

## 2016-11-07 DIAGNOSIS — I10 Essential (primary) hypertension: Secondary | ICD-10-CM | POA: Diagnosis not present

## 2016-11-08 DIAGNOSIS — J449 Chronic obstructive pulmonary disease, unspecified: Secondary | ICD-10-CM | POA: Diagnosis not present

## 2016-11-08 DIAGNOSIS — I10 Essential (primary) hypertension: Secondary | ICD-10-CM | POA: Diagnosis not present

## 2016-11-08 DIAGNOSIS — M169 Osteoarthritis of hip, unspecified: Secondary | ICD-10-CM | POA: Diagnosis not present

## 2016-11-09 DIAGNOSIS — J449 Chronic obstructive pulmonary disease, unspecified: Secondary | ICD-10-CM | POA: Diagnosis not present

## 2016-11-09 DIAGNOSIS — Z125 Encounter for screening for malignant neoplasm of prostate: Secondary | ICD-10-CM | POA: Diagnosis not present

## 2016-11-09 DIAGNOSIS — M1A9XX0 Chronic gout, unspecified, without tophus (tophi): Secondary | ICD-10-CM | POA: Diagnosis not present

## 2016-11-09 DIAGNOSIS — M169 Osteoarthritis of hip, unspecified: Secondary | ICD-10-CM | POA: Diagnosis not present

## 2016-11-09 DIAGNOSIS — Z13220 Encounter for screening for lipoid disorders: Secondary | ICD-10-CM | POA: Diagnosis not present

## 2016-11-09 DIAGNOSIS — Z131 Encounter for screening for diabetes mellitus: Secondary | ICD-10-CM | POA: Diagnosis not present

## 2016-11-09 DIAGNOSIS — Z716 Tobacco abuse counseling: Secondary | ICD-10-CM | POA: Diagnosis not present

## 2016-11-09 DIAGNOSIS — F17200 Nicotine dependence, unspecified, uncomplicated: Secondary | ICD-10-CM | POA: Diagnosis not present

## 2016-11-09 DIAGNOSIS — I10 Essential (primary) hypertension: Secondary | ICD-10-CM | POA: Diagnosis not present

## 2016-11-10 DIAGNOSIS — M169 Osteoarthritis of hip, unspecified: Secondary | ICD-10-CM | POA: Diagnosis not present

## 2016-11-10 DIAGNOSIS — I10 Essential (primary) hypertension: Secondary | ICD-10-CM | POA: Diagnosis not present

## 2016-11-10 DIAGNOSIS — J449 Chronic obstructive pulmonary disease, unspecified: Secondary | ICD-10-CM | POA: Diagnosis not present

## 2016-11-11 DIAGNOSIS — M169 Osteoarthritis of hip, unspecified: Secondary | ICD-10-CM | POA: Diagnosis not present

## 2016-11-11 DIAGNOSIS — I10 Essential (primary) hypertension: Secondary | ICD-10-CM | POA: Diagnosis not present

## 2016-11-11 DIAGNOSIS — J449 Chronic obstructive pulmonary disease, unspecified: Secondary | ICD-10-CM | POA: Diagnosis not present

## 2016-11-12 DIAGNOSIS — I10 Essential (primary) hypertension: Secondary | ICD-10-CM | POA: Diagnosis not present

## 2016-11-12 DIAGNOSIS — J449 Chronic obstructive pulmonary disease, unspecified: Secondary | ICD-10-CM | POA: Diagnosis not present

## 2016-11-12 DIAGNOSIS — M169 Osteoarthritis of hip, unspecified: Secondary | ICD-10-CM | POA: Diagnosis not present

## 2016-11-13 DIAGNOSIS — I10 Essential (primary) hypertension: Secondary | ICD-10-CM | POA: Diagnosis not present

## 2016-11-13 DIAGNOSIS — M169 Osteoarthritis of hip, unspecified: Secondary | ICD-10-CM | POA: Diagnosis not present

## 2016-11-13 DIAGNOSIS — J449 Chronic obstructive pulmonary disease, unspecified: Secondary | ICD-10-CM | POA: Diagnosis not present

## 2016-11-14 DIAGNOSIS — M169 Osteoarthritis of hip, unspecified: Secondary | ICD-10-CM | POA: Diagnosis not present

## 2016-11-14 DIAGNOSIS — I10 Essential (primary) hypertension: Secondary | ICD-10-CM | POA: Diagnosis not present

## 2016-11-14 DIAGNOSIS — J449 Chronic obstructive pulmonary disease, unspecified: Secondary | ICD-10-CM | POA: Diagnosis not present

## 2016-11-15 DIAGNOSIS — M169 Osteoarthritis of hip, unspecified: Secondary | ICD-10-CM | POA: Diagnosis not present

## 2016-11-15 DIAGNOSIS — I10 Essential (primary) hypertension: Secondary | ICD-10-CM | POA: Diagnosis not present

## 2016-11-15 DIAGNOSIS — J449 Chronic obstructive pulmonary disease, unspecified: Secondary | ICD-10-CM | POA: Diagnosis not present

## 2016-11-16 DIAGNOSIS — M169 Osteoarthritis of hip, unspecified: Secondary | ICD-10-CM | POA: Diagnosis not present

## 2016-11-16 DIAGNOSIS — I10 Essential (primary) hypertension: Secondary | ICD-10-CM | POA: Diagnosis not present

## 2016-11-16 DIAGNOSIS — J449 Chronic obstructive pulmonary disease, unspecified: Secondary | ICD-10-CM | POA: Diagnosis not present

## 2016-11-17 DIAGNOSIS — J449 Chronic obstructive pulmonary disease, unspecified: Secondary | ICD-10-CM | POA: Diagnosis not present

## 2016-11-17 DIAGNOSIS — I10 Essential (primary) hypertension: Secondary | ICD-10-CM | POA: Diagnosis not present

## 2016-11-17 DIAGNOSIS — M169 Osteoarthritis of hip, unspecified: Secondary | ICD-10-CM | POA: Diagnosis not present

## 2016-11-18 DIAGNOSIS — I10 Essential (primary) hypertension: Secondary | ICD-10-CM | POA: Diagnosis not present

## 2016-11-18 DIAGNOSIS — M169 Osteoarthritis of hip, unspecified: Secondary | ICD-10-CM | POA: Diagnosis not present

## 2016-11-18 DIAGNOSIS — J449 Chronic obstructive pulmonary disease, unspecified: Secondary | ICD-10-CM | POA: Diagnosis not present

## 2016-11-19 DIAGNOSIS — I10 Essential (primary) hypertension: Secondary | ICD-10-CM | POA: Diagnosis not present

## 2016-11-19 DIAGNOSIS — J449 Chronic obstructive pulmonary disease, unspecified: Secondary | ICD-10-CM | POA: Diagnosis not present

## 2016-11-19 DIAGNOSIS — M169 Osteoarthritis of hip, unspecified: Secondary | ICD-10-CM | POA: Diagnosis not present

## 2016-11-20 DIAGNOSIS — I10 Essential (primary) hypertension: Secondary | ICD-10-CM | POA: Diagnosis not present

## 2016-11-20 DIAGNOSIS — M169 Osteoarthritis of hip, unspecified: Secondary | ICD-10-CM | POA: Diagnosis not present

## 2016-11-20 DIAGNOSIS — J449 Chronic obstructive pulmonary disease, unspecified: Secondary | ICD-10-CM | POA: Diagnosis not present

## 2016-11-21 DIAGNOSIS — I10 Essential (primary) hypertension: Secondary | ICD-10-CM | POA: Diagnosis not present

## 2016-11-21 DIAGNOSIS — J449 Chronic obstructive pulmonary disease, unspecified: Secondary | ICD-10-CM | POA: Diagnosis not present

## 2016-11-21 DIAGNOSIS — M169 Osteoarthritis of hip, unspecified: Secondary | ICD-10-CM | POA: Diagnosis not present

## 2016-11-22 DIAGNOSIS — I10 Essential (primary) hypertension: Secondary | ICD-10-CM | POA: Diagnosis not present

## 2016-11-22 DIAGNOSIS — J449 Chronic obstructive pulmonary disease, unspecified: Secondary | ICD-10-CM | POA: Diagnosis not present

## 2016-11-22 DIAGNOSIS — M169 Osteoarthritis of hip, unspecified: Secondary | ICD-10-CM | POA: Diagnosis not present

## 2016-11-23 DIAGNOSIS — I10 Essential (primary) hypertension: Secondary | ICD-10-CM | POA: Diagnosis not present

## 2016-11-23 DIAGNOSIS — J449 Chronic obstructive pulmonary disease, unspecified: Secondary | ICD-10-CM | POA: Diagnosis not present

## 2016-11-23 DIAGNOSIS — M169 Osteoarthritis of hip, unspecified: Secondary | ICD-10-CM | POA: Diagnosis not present

## 2016-11-25 DIAGNOSIS — I10 Essential (primary) hypertension: Secondary | ICD-10-CM | POA: Diagnosis not present

## 2016-11-25 DIAGNOSIS — J449 Chronic obstructive pulmonary disease, unspecified: Secondary | ICD-10-CM | POA: Diagnosis not present

## 2016-11-25 DIAGNOSIS — M169 Osteoarthritis of hip, unspecified: Secondary | ICD-10-CM | POA: Diagnosis not present

## 2016-11-26 DIAGNOSIS — J449 Chronic obstructive pulmonary disease, unspecified: Secondary | ICD-10-CM | POA: Diagnosis not present

## 2016-11-26 DIAGNOSIS — I10 Essential (primary) hypertension: Secondary | ICD-10-CM | POA: Diagnosis not present

## 2016-11-26 DIAGNOSIS — M169 Osteoarthritis of hip, unspecified: Secondary | ICD-10-CM | POA: Diagnosis not present

## 2016-11-27 DIAGNOSIS — I10 Essential (primary) hypertension: Secondary | ICD-10-CM | POA: Diagnosis not present

## 2016-11-27 DIAGNOSIS — J449 Chronic obstructive pulmonary disease, unspecified: Secondary | ICD-10-CM | POA: Diagnosis not present

## 2016-11-27 DIAGNOSIS — M169 Osteoarthritis of hip, unspecified: Secondary | ICD-10-CM | POA: Diagnosis not present

## 2016-11-28 DIAGNOSIS — M169 Osteoarthritis of hip, unspecified: Secondary | ICD-10-CM | POA: Diagnosis not present

## 2016-11-28 DIAGNOSIS — J449 Chronic obstructive pulmonary disease, unspecified: Secondary | ICD-10-CM | POA: Diagnosis not present

## 2016-11-28 DIAGNOSIS — I10 Essential (primary) hypertension: Secondary | ICD-10-CM | POA: Diagnosis not present

## 2016-11-29 DIAGNOSIS — I10 Essential (primary) hypertension: Secondary | ICD-10-CM | POA: Diagnosis not present

## 2016-11-29 DIAGNOSIS — J449 Chronic obstructive pulmonary disease, unspecified: Secondary | ICD-10-CM | POA: Diagnosis not present

## 2016-11-29 DIAGNOSIS — M169 Osteoarthritis of hip, unspecified: Secondary | ICD-10-CM | POA: Diagnosis not present

## 2016-11-30 DIAGNOSIS — M169 Osteoarthritis of hip, unspecified: Secondary | ICD-10-CM | POA: Diagnosis not present

## 2016-11-30 DIAGNOSIS — J449 Chronic obstructive pulmonary disease, unspecified: Secondary | ICD-10-CM | POA: Diagnosis not present

## 2016-11-30 DIAGNOSIS — I10 Essential (primary) hypertension: Secondary | ICD-10-CM | POA: Diagnosis not present

## 2016-12-01 DIAGNOSIS — M169 Osteoarthritis of hip, unspecified: Secondary | ICD-10-CM | POA: Diagnosis not present

## 2016-12-01 DIAGNOSIS — J449 Chronic obstructive pulmonary disease, unspecified: Secondary | ICD-10-CM | POA: Diagnosis not present

## 2016-12-01 DIAGNOSIS — I10 Essential (primary) hypertension: Secondary | ICD-10-CM | POA: Diagnosis not present

## 2016-12-02 DIAGNOSIS — J449 Chronic obstructive pulmonary disease, unspecified: Secondary | ICD-10-CM | POA: Diagnosis not present

## 2016-12-02 DIAGNOSIS — I10 Essential (primary) hypertension: Secondary | ICD-10-CM | POA: Diagnosis not present

## 2016-12-02 DIAGNOSIS — M169 Osteoarthritis of hip, unspecified: Secondary | ICD-10-CM | POA: Diagnosis not present

## 2016-12-03 DIAGNOSIS — M169 Osteoarthritis of hip, unspecified: Secondary | ICD-10-CM | POA: Diagnosis not present

## 2016-12-03 DIAGNOSIS — I10 Essential (primary) hypertension: Secondary | ICD-10-CM | POA: Diagnosis not present

## 2016-12-03 DIAGNOSIS — J449 Chronic obstructive pulmonary disease, unspecified: Secondary | ICD-10-CM | POA: Diagnosis not present

## 2016-12-04 DIAGNOSIS — M169 Osteoarthritis of hip, unspecified: Secondary | ICD-10-CM | POA: Diagnosis not present

## 2016-12-04 DIAGNOSIS — I10 Essential (primary) hypertension: Secondary | ICD-10-CM | POA: Diagnosis not present

## 2016-12-04 DIAGNOSIS — J449 Chronic obstructive pulmonary disease, unspecified: Secondary | ICD-10-CM | POA: Diagnosis not present

## 2016-12-05 DIAGNOSIS — M169 Osteoarthritis of hip, unspecified: Secondary | ICD-10-CM | POA: Diagnosis not present

## 2016-12-05 DIAGNOSIS — J449 Chronic obstructive pulmonary disease, unspecified: Secondary | ICD-10-CM | POA: Diagnosis not present

## 2016-12-05 DIAGNOSIS — I10 Essential (primary) hypertension: Secondary | ICD-10-CM | POA: Diagnosis not present

## 2016-12-06 DIAGNOSIS — J449 Chronic obstructive pulmonary disease, unspecified: Secondary | ICD-10-CM | POA: Diagnosis not present

## 2016-12-06 DIAGNOSIS — M169 Osteoarthritis of hip, unspecified: Secondary | ICD-10-CM | POA: Diagnosis not present

## 2016-12-06 DIAGNOSIS — I10 Essential (primary) hypertension: Secondary | ICD-10-CM | POA: Diagnosis not present

## 2016-12-07 DIAGNOSIS — M169 Osteoarthritis of hip, unspecified: Secondary | ICD-10-CM | POA: Diagnosis not present

## 2016-12-07 DIAGNOSIS — J449 Chronic obstructive pulmonary disease, unspecified: Secondary | ICD-10-CM | POA: Diagnosis not present

## 2016-12-07 DIAGNOSIS — I10 Essential (primary) hypertension: Secondary | ICD-10-CM | POA: Diagnosis not present

## 2016-12-08 DIAGNOSIS — J449 Chronic obstructive pulmonary disease, unspecified: Secondary | ICD-10-CM | POA: Diagnosis not present

## 2016-12-08 DIAGNOSIS — M169 Osteoarthritis of hip, unspecified: Secondary | ICD-10-CM | POA: Diagnosis not present

## 2016-12-08 DIAGNOSIS — I10 Essential (primary) hypertension: Secondary | ICD-10-CM | POA: Diagnosis not present

## 2016-12-09 DIAGNOSIS — I10 Essential (primary) hypertension: Secondary | ICD-10-CM | POA: Diagnosis not present

## 2016-12-09 DIAGNOSIS — M169 Osteoarthritis of hip, unspecified: Secondary | ICD-10-CM | POA: Diagnosis not present

## 2016-12-09 DIAGNOSIS — J449 Chronic obstructive pulmonary disease, unspecified: Secondary | ICD-10-CM | POA: Diagnosis not present

## 2016-12-10 DIAGNOSIS — M169 Osteoarthritis of hip, unspecified: Secondary | ICD-10-CM | POA: Diagnosis not present

## 2016-12-10 DIAGNOSIS — J449 Chronic obstructive pulmonary disease, unspecified: Secondary | ICD-10-CM | POA: Diagnosis not present

## 2016-12-10 DIAGNOSIS — I10 Essential (primary) hypertension: Secondary | ICD-10-CM | POA: Diagnosis not present

## 2016-12-11 DIAGNOSIS — M169 Osteoarthritis of hip, unspecified: Secondary | ICD-10-CM | POA: Diagnosis not present

## 2016-12-11 DIAGNOSIS — J449 Chronic obstructive pulmonary disease, unspecified: Secondary | ICD-10-CM | POA: Diagnosis not present

## 2016-12-11 DIAGNOSIS — I10 Essential (primary) hypertension: Secondary | ICD-10-CM | POA: Diagnosis not present

## 2016-12-12 DIAGNOSIS — M169 Osteoarthritis of hip, unspecified: Secondary | ICD-10-CM | POA: Diagnosis not present

## 2016-12-12 DIAGNOSIS — J449 Chronic obstructive pulmonary disease, unspecified: Secondary | ICD-10-CM | POA: Diagnosis not present

## 2016-12-12 DIAGNOSIS — I10 Essential (primary) hypertension: Secondary | ICD-10-CM | POA: Diagnosis not present

## 2016-12-13 DIAGNOSIS — M169 Osteoarthritis of hip, unspecified: Secondary | ICD-10-CM | POA: Diagnosis not present

## 2016-12-13 DIAGNOSIS — J449 Chronic obstructive pulmonary disease, unspecified: Secondary | ICD-10-CM | POA: Diagnosis not present

## 2016-12-13 DIAGNOSIS — I10 Essential (primary) hypertension: Secondary | ICD-10-CM | POA: Diagnosis not present

## 2016-12-14 DIAGNOSIS — M169 Osteoarthritis of hip, unspecified: Secondary | ICD-10-CM | POA: Diagnosis not present

## 2016-12-14 DIAGNOSIS — J449 Chronic obstructive pulmonary disease, unspecified: Secondary | ICD-10-CM | POA: Diagnosis not present

## 2016-12-14 DIAGNOSIS — I10 Essential (primary) hypertension: Secondary | ICD-10-CM | POA: Diagnosis not present

## 2016-12-15 DIAGNOSIS — I10 Essential (primary) hypertension: Secondary | ICD-10-CM | POA: Diagnosis not present

## 2016-12-15 DIAGNOSIS — M169 Osteoarthritis of hip, unspecified: Secondary | ICD-10-CM | POA: Diagnosis not present

## 2016-12-15 DIAGNOSIS — J449 Chronic obstructive pulmonary disease, unspecified: Secondary | ICD-10-CM | POA: Diagnosis not present

## 2016-12-16 DIAGNOSIS — M169 Osteoarthritis of hip, unspecified: Secondary | ICD-10-CM | POA: Diagnosis not present

## 2016-12-16 DIAGNOSIS — J449 Chronic obstructive pulmonary disease, unspecified: Secondary | ICD-10-CM | POA: Diagnosis not present

## 2016-12-16 DIAGNOSIS — I10 Essential (primary) hypertension: Secondary | ICD-10-CM | POA: Diagnosis not present

## 2016-12-17 DIAGNOSIS — M169 Osteoarthritis of hip, unspecified: Secondary | ICD-10-CM | POA: Diagnosis not present

## 2016-12-17 DIAGNOSIS — I10 Essential (primary) hypertension: Secondary | ICD-10-CM | POA: Diagnosis not present

## 2016-12-17 DIAGNOSIS — J449 Chronic obstructive pulmonary disease, unspecified: Secondary | ICD-10-CM | POA: Diagnosis not present

## 2016-12-18 DIAGNOSIS — I10 Essential (primary) hypertension: Secondary | ICD-10-CM | POA: Diagnosis not present

## 2016-12-18 DIAGNOSIS — J449 Chronic obstructive pulmonary disease, unspecified: Secondary | ICD-10-CM | POA: Diagnosis not present

## 2016-12-18 DIAGNOSIS — M169 Osteoarthritis of hip, unspecified: Secondary | ICD-10-CM | POA: Diagnosis not present

## 2016-12-19 DIAGNOSIS — I10 Essential (primary) hypertension: Secondary | ICD-10-CM | POA: Diagnosis not present

## 2016-12-19 DIAGNOSIS — M169 Osteoarthritis of hip, unspecified: Secondary | ICD-10-CM | POA: Diagnosis not present

## 2016-12-19 DIAGNOSIS — J449 Chronic obstructive pulmonary disease, unspecified: Secondary | ICD-10-CM | POA: Diagnosis not present

## 2016-12-20 DIAGNOSIS — J449 Chronic obstructive pulmonary disease, unspecified: Secondary | ICD-10-CM | POA: Diagnosis not present

## 2016-12-20 DIAGNOSIS — I10 Essential (primary) hypertension: Secondary | ICD-10-CM | POA: Diagnosis not present

## 2016-12-20 DIAGNOSIS — M169 Osteoarthritis of hip, unspecified: Secondary | ICD-10-CM | POA: Diagnosis not present

## 2016-12-21 DIAGNOSIS — J449 Chronic obstructive pulmonary disease, unspecified: Secondary | ICD-10-CM | POA: Diagnosis not present

## 2016-12-21 DIAGNOSIS — M169 Osteoarthritis of hip, unspecified: Secondary | ICD-10-CM | POA: Diagnosis not present

## 2016-12-21 DIAGNOSIS — I10 Essential (primary) hypertension: Secondary | ICD-10-CM | POA: Diagnosis not present

## 2016-12-22 DIAGNOSIS — M169 Osteoarthritis of hip, unspecified: Secondary | ICD-10-CM | POA: Diagnosis not present

## 2016-12-22 DIAGNOSIS — I10 Essential (primary) hypertension: Secondary | ICD-10-CM | POA: Diagnosis not present

## 2016-12-22 DIAGNOSIS — J449 Chronic obstructive pulmonary disease, unspecified: Secondary | ICD-10-CM | POA: Diagnosis not present

## 2016-12-23 DIAGNOSIS — J449 Chronic obstructive pulmonary disease, unspecified: Secondary | ICD-10-CM | POA: Diagnosis not present

## 2016-12-23 DIAGNOSIS — M169 Osteoarthritis of hip, unspecified: Secondary | ICD-10-CM | POA: Diagnosis not present

## 2016-12-23 DIAGNOSIS — I10 Essential (primary) hypertension: Secondary | ICD-10-CM | POA: Diagnosis not present

## 2016-12-24 DIAGNOSIS — J449 Chronic obstructive pulmonary disease, unspecified: Secondary | ICD-10-CM | POA: Diagnosis not present

## 2016-12-24 DIAGNOSIS — M169 Osteoarthritis of hip, unspecified: Secondary | ICD-10-CM | POA: Diagnosis not present

## 2016-12-24 DIAGNOSIS — I10 Essential (primary) hypertension: Secondary | ICD-10-CM | POA: Diagnosis not present

## 2016-12-25 DIAGNOSIS — J449 Chronic obstructive pulmonary disease, unspecified: Secondary | ICD-10-CM | POA: Diagnosis not present

## 2016-12-25 DIAGNOSIS — I10 Essential (primary) hypertension: Secondary | ICD-10-CM | POA: Diagnosis not present

## 2016-12-25 DIAGNOSIS — M169 Osteoarthritis of hip, unspecified: Secondary | ICD-10-CM | POA: Diagnosis not present

## 2016-12-26 DIAGNOSIS — J449 Chronic obstructive pulmonary disease, unspecified: Secondary | ICD-10-CM | POA: Diagnosis not present

## 2016-12-26 DIAGNOSIS — M169 Osteoarthritis of hip, unspecified: Secondary | ICD-10-CM | POA: Diagnosis not present

## 2016-12-26 DIAGNOSIS — I10 Essential (primary) hypertension: Secondary | ICD-10-CM | POA: Diagnosis not present

## 2016-12-27 DIAGNOSIS — M169 Osteoarthritis of hip, unspecified: Secondary | ICD-10-CM | POA: Diagnosis not present

## 2016-12-27 DIAGNOSIS — J449 Chronic obstructive pulmonary disease, unspecified: Secondary | ICD-10-CM | POA: Diagnosis not present

## 2016-12-27 DIAGNOSIS — I10 Essential (primary) hypertension: Secondary | ICD-10-CM | POA: Diagnosis not present

## 2016-12-28 DIAGNOSIS — M169 Osteoarthritis of hip, unspecified: Secondary | ICD-10-CM | POA: Diagnosis not present

## 2016-12-28 DIAGNOSIS — J449 Chronic obstructive pulmonary disease, unspecified: Secondary | ICD-10-CM | POA: Diagnosis not present

## 2016-12-28 DIAGNOSIS — I10 Essential (primary) hypertension: Secondary | ICD-10-CM | POA: Diagnosis not present

## 2016-12-29 DIAGNOSIS — J449 Chronic obstructive pulmonary disease, unspecified: Secondary | ICD-10-CM | POA: Diagnosis not present

## 2016-12-29 DIAGNOSIS — I10 Essential (primary) hypertension: Secondary | ICD-10-CM | POA: Diagnosis not present

## 2016-12-29 DIAGNOSIS — M169 Osteoarthritis of hip, unspecified: Secondary | ICD-10-CM | POA: Diagnosis not present

## 2016-12-30 DIAGNOSIS — J449 Chronic obstructive pulmonary disease, unspecified: Secondary | ICD-10-CM | POA: Diagnosis not present

## 2016-12-30 DIAGNOSIS — I10 Essential (primary) hypertension: Secondary | ICD-10-CM | POA: Diagnosis not present

## 2016-12-30 DIAGNOSIS — M169 Osteoarthritis of hip, unspecified: Secondary | ICD-10-CM | POA: Diagnosis not present

## 2016-12-31 DIAGNOSIS — J449 Chronic obstructive pulmonary disease, unspecified: Secondary | ICD-10-CM | POA: Diagnosis not present

## 2016-12-31 DIAGNOSIS — I10 Essential (primary) hypertension: Secondary | ICD-10-CM | POA: Diagnosis not present

## 2016-12-31 DIAGNOSIS — M169 Osteoarthritis of hip, unspecified: Secondary | ICD-10-CM | POA: Diagnosis not present

## 2017-01-01 DIAGNOSIS — I10 Essential (primary) hypertension: Secondary | ICD-10-CM | POA: Diagnosis not present

## 2017-01-01 DIAGNOSIS — M169 Osteoarthritis of hip, unspecified: Secondary | ICD-10-CM | POA: Diagnosis not present

## 2017-01-01 DIAGNOSIS — J449 Chronic obstructive pulmonary disease, unspecified: Secondary | ICD-10-CM | POA: Diagnosis not present

## 2017-01-02 DIAGNOSIS — J449 Chronic obstructive pulmonary disease, unspecified: Secondary | ICD-10-CM | POA: Diagnosis not present

## 2017-01-02 DIAGNOSIS — I10 Essential (primary) hypertension: Secondary | ICD-10-CM | POA: Diagnosis not present

## 2017-01-02 DIAGNOSIS — M169 Osteoarthritis of hip, unspecified: Secondary | ICD-10-CM | POA: Diagnosis not present

## 2017-01-03 DIAGNOSIS — I10 Essential (primary) hypertension: Secondary | ICD-10-CM | POA: Diagnosis not present

## 2017-01-03 DIAGNOSIS — J449 Chronic obstructive pulmonary disease, unspecified: Secondary | ICD-10-CM | POA: Diagnosis not present

## 2017-01-03 DIAGNOSIS — M169 Osteoarthritis of hip, unspecified: Secondary | ICD-10-CM | POA: Diagnosis not present

## 2017-01-04 DIAGNOSIS — I10 Essential (primary) hypertension: Secondary | ICD-10-CM | POA: Diagnosis not present

## 2017-01-04 DIAGNOSIS — J449 Chronic obstructive pulmonary disease, unspecified: Secondary | ICD-10-CM | POA: Diagnosis not present

## 2017-01-04 DIAGNOSIS — M169 Osteoarthritis of hip, unspecified: Secondary | ICD-10-CM | POA: Diagnosis not present

## 2017-01-05 DIAGNOSIS — I10 Essential (primary) hypertension: Secondary | ICD-10-CM | POA: Diagnosis not present

## 2017-01-05 DIAGNOSIS — M169 Osteoarthritis of hip, unspecified: Secondary | ICD-10-CM | POA: Diagnosis not present

## 2017-01-05 DIAGNOSIS — J449 Chronic obstructive pulmonary disease, unspecified: Secondary | ICD-10-CM | POA: Diagnosis not present

## 2017-01-06 DIAGNOSIS — J449 Chronic obstructive pulmonary disease, unspecified: Secondary | ICD-10-CM | POA: Diagnosis not present

## 2017-01-06 DIAGNOSIS — I10 Essential (primary) hypertension: Secondary | ICD-10-CM | POA: Diagnosis not present

## 2017-01-06 DIAGNOSIS — M169 Osteoarthritis of hip, unspecified: Secondary | ICD-10-CM | POA: Diagnosis not present

## 2017-01-07 DIAGNOSIS — M169 Osteoarthritis of hip, unspecified: Secondary | ICD-10-CM | POA: Diagnosis not present

## 2017-01-07 DIAGNOSIS — I10 Essential (primary) hypertension: Secondary | ICD-10-CM | POA: Diagnosis not present

## 2017-01-07 DIAGNOSIS — J449 Chronic obstructive pulmonary disease, unspecified: Secondary | ICD-10-CM | POA: Diagnosis not present

## 2017-01-08 DIAGNOSIS — I10 Essential (primary) hypertension: Secondary | ICD-10-CM | POA: Diagnosis not present

## 2017-01-08 DIAGNOSIS — J449 Chronic obstructive pulmonary disease, unspecified: Secondary | ICD-10-CM | POA: Diagnosis not present

## 2017-01-08 DIAGNOSIS — M169 Osteoarthritis of hip, unspecified: Secondary | ICD-10-CM | POA: Diagnosis not present

## 2017-01-09 DIAGNOSIS — I10 Essential (primary) hypertension: Secondary | ICD-10-CM | POA: Diagnosis not present

## 2017-01-09 DIAGNOSIS — J449 Chronic obstructive pulmonary disease, unspecified: Secondary | ICD-10-CM | POA: Diagnosis not present

## 2017-01-09 DIAGNOSIS — M169 Osteoarthritis of hip, unspecified: Secondary | ICD-10-CM | POA: Diagnosis not present

## 2017-01-10 DIAGNOSIS — M169 Osteoarthritis of hip, unspecified: Secondary | ICD-10-CM | POA: Diagnosis not present

## 2017-01-10 DIAGNOSIS — I10 Essential (primary) hypertension: Secondary | ICD-10-CM | POA: Diagnosis not present

## 2017-01-10 DIAGNOSIS — J449 Chronic obstructive pulmonary disease, unspecified: Secondary | ICD-10-CM | POA: Diagnosis not present

## 2017-01-11 DIAGNOSIS — I10 Essential (primary) hypertension: Secondary | ICD-10-CM | POA: Diagnosis not present

## 2017-01-11 DIAGNOSIS — M169 Osteoarthritis of hip, unspecified: Secondary | ICD-10-CM | POA: Diagnosis not present

## 2017-01-11 DIAGNOSIS — J449 Chronic obstructive pulmonary disease, unspecified: Secondary | ICD-10-CM | POA: Diagnosis not present

## 2017-01-11 NOTE — Telephone Encounter (Signed)
error 

## 2017-01-12 DIAGNOSIS — J449 Chronic obstructive pulmonary disease, unspecified: Secondary | ICD-10-CM | POA: Diagnosis not present

## 2017-01-12 DIAGNOSIS — M169 Osteoarthritis of hip, unspecified: Secondary | ICD-10-CM | POA: Diagnosis not present

## 2017-01-12 DIAGNOSIS — I10 Essential (primary) hypertension: Secondary | ICD-10-CM | POA: Diagnosis not present

## 2017-01-13 DIAGNOSIS — M169 Osteoarthritis of hip, unspecified: Secondary | ICD-10-CM | POA: Diagnosis not present

## 2017-01-13 DIAGNOSIS — J449 Chronic obstructive pulmonary disease, unspecified: Secondary | ICD-10-CM | POA: Diagnosis not present

## 2017-01-13 DIAGNOSIS — I10 Essential (primary) hypertension: Secondary | ICD-10-CM | POA: Diagnosis not present

## 2017-01-28 DIAGNOSIS — J449 Chronic obstructive pulmonary disease, unspecified: Secondary | ICD-10-CM | POA: Diagnosis not present

## 2017-01-29 DIAGNOSIS — J449 Chronic obstructive pulmonary disease, unspecified: Secondary | ICD-10-CM | POA: Diagnosis not present

## 2017-01-30 DIAGNOSIS — J449 Chronic obstructive pulmonary disease, unspecified: Secondary | ICD-10-CM | POA: Diagnosis not present

## 2017-01-31 DIAGNOSIS — J449 Chronic obstructive pulmonary disease, unspecified: Secondary | ICD-10-CM | POA: Diagnosis not present

## 2017-02-01 DIAGNOSIS — J449 Chronic obstructive pulmonary disease, unspecified: Secondary | ICD-10-CM | POA: Diagnosis not present

## 2017-02-02 DIAGNOSIS — J449 Chronic obstructive pulmonary disease, unspecified: Secondary | ICD-10-CM | POA: Diagnosis not present

## 2017-02-03 DIAGNOSIS — J449 Chronic obstructive pulmonary disease, unspecified: Secondary | ICD-10-CM | POA: Diagnosis not present

## 2017-02-04 DIAGNOSIS — J449 Chronic obstructive pulmonary disease, unspecified: Secondary | ICD-10-CM | POA: Diagnosis not present

## 2017-02-05 DIAGNOSIS — J449 Chronic obstructive pulmonary disease, unspecified: Secondary | ICD-10-CM | POA: Diagnosis not present

## 2017-02-06 DIAGNOSIS — J449 Chronic obstructive pulmonary disease, unspecified: Secondary | ICD-10-CM | POA: Diagnosis not present

## 2017-02-07 DIAGNOSIS — J449 Chronic obstructive pulmonary disease, unspecified: Secondary | ICD-10-CM | POA: Diagnosis not present

## 2017-02-08 DIAGNOSIS — J449 Chronic obstructive pulmonary disease, unspecified: Secondary | ICD-10-CM | POA: Diagnosis not present

## 2017-02-09 DIAGNOSIS — J449 Chronic obstructive pulmonary disease, unspecified: Secondary | ICD-10-CM | POA: Diagnosis not present

## 2017-02-10 DIAGNOSIS — J449 Chronic obstructive pulmonary disease, unspecified: Secondary | ICD-10-CM | POA: Diagnosis not present

## 2017-02-15 DIAGNOSIS — I10 Essential (primary) hypertension: Secondary | ICD-10-CM | POA: Diagnosis not present

## 2017-02-15 DIAGNOSIS — Z716 Tobacco abuse counseling: Secondary | ICD-10-CM | POA: Diagnosis not present

## 2017-02-15 DIAGNOSIS — J449 Chronic obstructive pulmonary disease, unspecified: Secondary | ICD-10-CM | POA: Diagnosis not present

## 2017-02-15 DIAGNOSIS — F17210 Nicotine dependence, cigarettes, uncomplicated: Secondary | ICD-10-CM | POA: Diagnosis not present

## 2017-02-15 DIAGNOSIS — Z23 Encounter for immunization: Secondary | ICD-10-CM | POA: Diagnosis not present

## 2017-02-18 DIAGNOSIS — J449 Chronic obstructive pulmonary disease, unspecified: Secondary | ICD-10-CM | POA: Diagnosis not present

## 2017-02-19 DIAGNOSIS — J449 Chronic obstructive pulmonary disease, unspecified: Secondary | ICD-10-CM | POA: Diagnosis not present

## 2017-02-20 DIAGNOSIS — J449 Chronic obstructive pulmonary disease, unspecified: Secondary | ICD-10-CM | POA: Diagnosis not present

## 2017-02-21 DIAGNOSIS — J449 Chronic obstructive pulmonary disease, unspecified: Secondary | ICD-10-CM | POA: Diagnosis not present

## 2017-02-22 DIAGNOSIS — J449 Chronic obstructive pulmonary disease, unspecified: Secondary | ICD-10-CM | POA: Diagnosis not present

## 2017-02-23 DIAGNOSIS — J449 Chronic obstructive pulmonary disease, unspecified: Secondary | ICD-10-CM | POA: Diagnosis not present

## 2017-02-24 DIAGNOSIS — J449 Chronic obstructive pulmonary disease, unspecified: Secondary | ICD-10-CM | POA: Diagnosis not present

## 2017-02-25 DIAGNOSIS — J449 Chronic obstructive pulmonary disease, unspecified: Secondary | ICD-10-CM | POA: Diagnosis not present

## 2017-02-26 DIAGNOSIS — J449 Chronic obstructive pulmonary disease, unspecified: Secondary | ICD-10-CM | POA: Diagnosis not present

## 2017-02-27 DIAGNOSIS — J449 Chronic obstructive pulmonary disease, unspecified: Secondary | ICD-10-CM | POA: Diagnosis not present

## 2017-02-28 DIAGNOSIS — J449 Chronic obstructive pulmonary disease, unspecified: Secondary | ICD-10-CM | POA: Diagnosis not present

## 2017-03-01 DIAGNOSIS — J449 Chronic obstructive pulmonary disease, unspecified: Secondary | ICD-10-CM | POA: Diagnosis not present

## 2017-03-02 DIAGNOSIS — J449 Chronic obstructive pulmonary disease, unspecified: Secondary | ICD-10-CM | POA: Diagnosis not present

## 2017-03-03 DIAGNOSIS — J449 Chronic obstructive pulmonary disease, unspecified: Secondary | ICD-10-CM | POA: Diagnosis not present

## 2017-03-04 DIAGNOSIS — J449 Chronic obstructive pulmonary disease, unspecified: Secondary | ICD-10-CM | POA: Diagnosis not present

## 2017-03-05 DIAGNOSIS — J449 Chronic obstructive pulmonary disease, unspecified: Secondary | ICD-10-CM | POA: Diagnosis not present

## 2017-03-06 DIAGNOSIS — J449 Chronic obstructive pulmonary disease, unspecified: Secondary | ICD-10-CM | POA: Diagnosis not present

## 2017-03-07 DIAGNOSIS — J449 Chronic obstructive pulmonary disease, unspecified: Secondary | ICD-10-CM | POA: Diagnosis not present

## 2017-03-08 DIAGNOSIS — J449 Chronic obstructive pulmonary disease, unspecified: Secondary | ICD-10-CM | POA: Diagnosis not present

## 2017-03-09 DIAGNOSIS — J449 Chronic obstructive pulmonary disease, unspecified: Secondary | ICD-10-CM | POA: Diagnosis not present

## 2017-03-10 DIAGNOSIS — J449 Chronic obstructive pulmonary disease, unspecified: Secondary | ICD-10-CM | POA: Diagnosis not present

## 2017-03-11 DIAGNOSIS — J449 Chronic obstructive pulmonary disease, unspecified: Secondary | ICD-10-CM | POA: Diagnosis not present

## 2017-03-12 DIAGNOSIS — J449 Chronic obstructive pulmonary disease, unspecified: Secondary | ICD-10-CM | POA: Diagnosis not present

## 2017-03-13 DIAGNOSIS — J449 Chronic obstructive pulmonary disease, unspecified: Secondary | ICD-10-CM | POA: Diagnosis not present

## 2017-03-14 DIAGNOSIS — J449 Chronic obstructive pulmonary disease, unspecified: Secondary | ICD-10-CM | POA: Diagnosis not present

## 2017-03-15 DIAGNOSIS — J449 Chronic obstructive pulmonary disease, unspecified: Secondary | ICD-10-CM | POA: Diagnosis not present

## 2017-03-16 DIAGNOSIS — J449 Chronic obstructive pulmonary disease, unspecified: Secondary | ICD-10-CM | POA: Diagnosis not present

## 2017-03-17 DIAGNOSIS — J449 Chronic obstructive pulmonary disease, unspecified: Secondary | ICD-10-CM | POA: Diagnosis not present

## 2017-03-18 DIAGNOSIS — J449 Chronic obstructive pulmonary disease, unspecified: Secondary | ICD-10-CM | POA: Diagnosis not present

## 2017-03-19 DIAGNOSIS — J449 Chronic obstructive pulmonary disease, unspecified: Secondary | ICD-10-CM | POA: Diagnosis not present

## 2017-03-20 DIAGNOSIS — J449 Chronic obstructive pulmonary disease, unspecified: Secondary | ICD-10-CM | POA: Diagnosis not present

## 2017-03-21 DIAGNOSIS — J449 Chronic obstructive pulmonary disease, unspecified: Secondary | ICD-10-CM | POA: Diagnosis not present

## 2017-03-22 DIAGNOSIS — J449 Chronic obstructive pulmonary disease, unspecified: Secondary | ICD-10-CM | POA: Diagnosis not present

## 2017-03-23 DIAGNOSIS — J449 Chronic obstructive pulmonary disease, unspecified: Secondary | ICD-10-CM | POA: Diagnosis not present

## 2017-03-24 DIAGNOSIS — J449 Chronic obstructive pulmonary disease, unspecified: Secondary | ICD-10-CM | POA: Diagnosis not present

## 2017-03-25 DIAGNOSIS — J449 Chronic obstructive pulmonary disease, unspecified: Secondary | ICD-10-CM | POA: Diagnosis not present

## 2017-03-26 DIAGNOSIS — J449 Chronic obstructive pulmonary disease, unspecified: Secondary | ICD-10-CM | POA: Diagnosis not present

## 2017-03-27 DIAGNOSIS — J449 Chronic obstructive pulmonary disease, unspecified: Secondary | ICD-10-CM | POA: Diagnosis not present

## 2017-03-28 DIAGNOSIS — J449 Chronic obstructive pulmonary disease, unspecified: Secondary | ICD-10-CM | POA: Diagnosis not present

## 2017-03-29 DIAGNOSIS — J449 Chronic obstructive pulmonary disease, unspecified: Secondary | ICD-10-CM | POA: Diagnosis not present

## 2017-03-29 DIAGNOSIS — Z21 Asymptomatic human immunodeficiency virus [HIV] infection status: Secondary | ICD-10-CM | POA: Diagnosis not present

## 2017-03-29 DIAGNOSIS — I10 Essential (primary) hypertension: Secondary | ICD-10-CM | POA: Diagnosis not present

## 2017-03-29 DIAGNOSIS — Z716 Tobacco abuse counseling: Secondary | ICD-10-CM | POA: Diagnosis not present

## 2017-03-29 DIAGNOSIS — F17210 Nicotine dependence, cigarettes, uncomplicated: Secondary | ICD-10-CM | POA: Diagnosis not present

## 2017-03-30 ENCOUNTER — Emergency Department (HOSPITAL_COMMUNITY)
Admission: EM | Admit: 2017-03-30 | Discharge: 2017-03-30 | Disposition: A | Discharge status: Home / Self Care | Payer: Medicare HMO | Attending: Emergency Medicine | Admitting: Emergency Medicine

## 2017-03-30 ENCOUNTER — Emergency Department (HOSPITAL_COMMUNITY): Payer: Medicare HMO

## 2017-03-30 ENCOUNTER — Encounter (HOSPITAL_COMMUNITY): Payer: Self-pay

## 2017-03-30 DIAGNOSIS — R918 Other nonspecific abnormal finding of lung field: Secondary | ICD-10-CM | POA: Diagnosis not present

## 2017-03-30 DIAGNOSIS — B20 Human immunodeficiency virus [HIV] disease: Secondary | ICD-10-CM | POA: Insufficient documentation

## 2017-03-30 DIAGNOSIS — F17210 Nicotine dependence, cigarettes, uncomplicated: Secondary | ICD-10-CM | POA: Insufficient documentation

## 2017-03-30 DIAGNOSIS — Z79899 Other long term (current) drug therapy: Secondary | ICD-10-CM | POA: Insufficient documentation

## 2017-03-30 DIAGNOSIS — J9383 Other pneumothorax: Secondary | ICD-10-CM

## 2017-03-30 DIAGNOSIS — J449 Chronic obstructive pulmonary disease, unspecified: Secondary | ICD-10-CM | POA: Diagnosis not present

## 2017-03-30 DIAGNOSIS — I10 Essential (primary) hypertension: Secondary | ICD-10-CM | POA: Diagnosis not present

## 2017-03-30 DIAGNOSIS — R05 Cough: Secondary | ICD-10-CM | POA: Diagnosis not present

## 2017-03-30 HISTORY — DX: Human immunodeficiency virus (HIV) disease: B20

## 2017-03-30 HISTORY — DX: Essential (primary) hypertension: I10

## 2017-03-30 NOTE — Discharge Instructions (Signed)
You have evidence of a collapse lung on the right side.  Please follow up with your doctor or with cardiothoracic specialist in the next 2 days for a repeat chest xray. Return sooner if your condition worsen. Avoid tobacco use.

## 2017-03-30 NOTE — ED Notes (Signed)
PA at bedside.

## 2017-03-30 NOTE — ED Provider Notes (Signed)
[facility] EMERGENCY DEPARTMENT Provider Note   CSN: [id number] Arrival date & time: 03/30/17  8118     History   Chief Complaint Chief Complaint  Patient presents with  . Cough    HPI Damen Gile is a 66 y.o. male.  HPI   66 year old male with history of HIV, substance abuse, hepatitis B and C, presenting for evaluation of a cough.  Patient had a routine physical with his primary care doctor yesterday.  During the evaluation he mentioned that he is been coughing productive with phlegm for the past 4 days.  A chest x-ray was obtained at that time.  Patient was given a course of Z-Pak and cough medication Tessalon Perles but was told to come to ER to rule out pneumothorax.  Patient denies having any prior history of pneumothorax.  He also denies having any increased shortness of breath, fever or chills.  No hemoptysis.  He is a smoker and smoked about 2-3 cigarettes a day.  Does have history of HIV and states that it is well controlled however he cannot recall his last CD4 count or viral load.  He is scheduled to be seen by his infectious disease specialist today.  He denies any recent trauma.  He has no other complaint.  Past Medical History:  Diagnosis Date  . 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 HIV (human immunodeficiency virus infection) (HCC)   . HIV infection (HCC)   . Hypertension     Patient Active Problem List   Diagnosis Date Noted  . Tobacco abuse 01/22/2016  . Screening examination for venereal disease 01/21/2016  . Abnormal drug screen 08/30/2015  . At risk for abuse of opiates 08/30/2015  . Misuse of prescription only drugs (HCC) 08/30/2015  . Long term current use of opiate analgesic 08/04/2015  . Long term prescription opiate use 08/04/2015  . Encounter for therapeutic drug level monitoring 08/04/2015  . Osteoarthritis of hip (Location of Primary Source of Pain) (Right) 08/04/2015  . Liver fibrosis  07/15/2015  . HIV (+) Human immunodeficiency virus disease (HCC) 04/15/2015  . Chronic hepatitis C without hepatic coma (HCC) 04/15/2015  . Chronic hip pain (Location of Primary Source of Pain) (Right) 04/08/2015  . History of substance abuse 03/31/2015  . 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 HERNIA REPAIR         Home Medications    Prior to Admission medications   Medication Sig Start Date End Date Taking? Authorizing Provider  dolutegravir (TIVICAY) 50 MG tablet Take 1 tablet (50 mg total) by mouth daily. 07/16/15   Gardiner Barefoot, MD  emtricitabine-tenofovir AF (DESCOVY) 200-25 MG tablet Take 1 tablet by mouth daily. 07/16/15   Gardiner Barefoot, MD  gabapentin (NEURONTIN) 100 MG capsule Take 1 capsule (100 mg total) by mouth every 8 (eight) hours. 08/04/15   Delano Metz, MD  Ledipasvir-Sofosbuvir (HARVONI) 90-400 MG TABS Take 1 tablet by mouth daily. 01/21/16   Gardiner Barefoot, MD  lisinopril (PRINIVIL,ZESTRIL) 10 MG tablet Take 10 mg by mouth daily. Reported on 07/15/2015    [provider]  meloxicam (MOBIC) 15 MG tablet Take 1 tablet (15 mg total) by mouth daily. 08/04/15   Delano Metz, MD    Family History Family History  Problem Relation Age of Onset  . Hypertension Mother   . Rheum arthritis Mother     Social  History Social History   Tobacco Use  . Smoking status: Current Every Day Smoker    Packs/day: 0.10    Years: 30.00    Pack years: 3.00    Types: Cigarettes  . Smokeless tobacco: Never Used  Substance Use Topics  . Alcohol use: Yes    Alcohol/week: 3.6 oz    Types: 6 Cans of beer per week  . Drug use: No    Comment: no heroin  since 2004      Allergies   Patient has no known allergies.   Review of Systems Review of Systems  All other systems reviewed and are negative.    Physical Exam Updated Vital Signs BP (!) 175/100 (BP Location: Right Arm)   Pulse 75    Temp 97.9 F (36.6 C) (Oral)   Resp 18   Ht 5\' 11"  (1.803 m)   Wt 53.1 kg (117 lb)   SpO2 99%   BMI 16.32 kg/m   Physical Exam  Constitutional: He is oriented to person, place, and time. He appears well-developed and well-nourished. No distress.  HENT:  Head: Atraumatic.  Poor dentition  Eyes: Conjunctivae are normal.  Neck: Normal range of motion. Neck supple. No tracheal deviation present.  Cardiovascular: Normal rate and regular rhythm.  Pulmonary/Chest: Effort normal and breath sounds normal. No stridor. No respiratory distress. He has no wheezes. He has no rales. He exhibits no tenderness.  No crepitus or emphysema noted on palpation throughout the entire chest and right shoulder. Lung sounds present throughout all lobes  Abdominal: Soft.  Musculoskeletal: He exhibits no edema.  Neurological: He is alert and oriented to person, place, and time.  Skin: No rash noted.  Psychiatric: He has a normal mood and affect.  Nursing note and vitals reviewed.    ED Treatments / Results  Labs (all labs ordered are listed, but only abnormal results are displayed) Labs Reviewed - No data to display  EKG  EKG Interpretation None       Radiology Ct Chest Wo Contrast  Result Date: 03/30/2017 CLINICAL DATA:  History of substance abuse and HIV. Now presents with subcutaneous gas in the right chest wall. EXAM: CT CHEST WITHOUT CONTRAST TECHNIQUE: Multidetector CT imaging of the chest was performed following the standard protocol without IV contrast. COMPARISON:  None. FINDINGS: Cardiovascular: Normal heart size. No pericardial effusion. Aortic atherosclerosis. Calcifications within the LAD and left circumflex coronary artery. Mediastinum/Nodes: The trachea appears patent and is midline. Normal appearance of the esophagus. No enlarged mediastinal or hilar lymph nodes. Lungs/Pleura: No pleural effusion. Moderate changes of centrilobular and paraseptal emphysema. Small right-sided  anteromedial and apical pneumothorax identified. 7 mm right lower lobe lung nodule identified, image 98 of series 8. Moderate diffuse bronchial wall thickening noted. Upper Abdomen: No acute abnormality. Stones identified within the gallbladder. Aortic atherosclerosis noted. Musculoskeletal: No chest wall mass or suspicious bone lesions identified. Right supraclavicular and right anterior, lateral and posterior chest wall subcutaneous emphysema identified. IMPRESSION: 1. Small right-sided pneumothorax. Moderate right-sided subcutaneous chest wall gas is identified 2.  Emphysema (ICD10-J43.9). 3. Right lower lobe pulmonary nodule measures 7 mm. Non-contrast chest CT at 6-12 months is recommended. If the nodule is stable at time of repeat CT, then future CT at 18-24 months (from today's scan) is considered optional for low-risk patients, but is recommended for high-risk patients. This recommendation follows the consensus statement: Guidelines for Management of Incidental Pulmonary Nodules Detected on CT Images: From the Fleischner Society 2017; Radiology 2017; 284:228-243. 4.  Aortic Atherosclerosis (ICD10-I70.0). 5. Critical Value/emergent results were called by telephone at the time of interpretation on 03/30/2017 at 11:14 am to Dr. Donnald Garre, [facility], who verbally acknowledged these results. Electronically Signed   By: Signa Kell M.D.   On: 03/30/2017 11:16   Dg Chest Portable 1 View  Result Date: 03/30/2017 CLINICAL DATA:  Re- days of cough. Current smoker. History of COPD, HIV. EXAM: PORTABLE CHEST 1 VIEW COMPARISON:  None in PACs FINDINGS: There is subcutaneous emphysema in the right axillary soft tissues and at the base of the neck. No definite right-sided pneumothorax is observed. There is no pneumomediastinum or pneumopericardium. The lungs are adequately inflated without alveolar infiltrate or pleural effusion. The heart and pulmonary vascularity are normal. The mediastinum is normal in width. There is  calcification in the wall of the aortic arch. The observed bony thorax is unremarkable. IMPRESSION: There is subcutaneous emphysema on the right of uncertain etiology. No definite pneumothorax or pneumomediastinum is observed. An end-expiratory chest x-ray would be a useful next imaging step in an effort to detect a small pneumothorax. No evidence of pneumonia. Thoracic aortic atherosclerosis. Electronically Signed   By: David  [place] M.D.   On: 03/30/2017 09:53    Procedures Procedures (including critical care time)  Medications Ordered in ED Medications - No data to display   Initial Impression / Assessment and Plan / ED Course  I have reviewed the triage vital signs and the nursing notes.  Pertinent labs & imaging results that were available during my care of the patient were reviewed by me and considered in my medical decision making (see chart for details).     BP (!) 159/89   Pulse 74   Temp 97.9 F (36.6 C) (Oral)   Resp 18   Ht 5\' 11"  (1.803 m)   Wt 53.1 kg (117 lb)   SpO2 99%   BMI 16.32 kg/m    Final Clinical Impressions(s) / ED Diagnoses   Final diagnoses:  Spontaneous pneumothorax    ED Discharge Orders    None     10:06 AM Patient sent here to rule out pneumothorax when he complaining of a cough for the past 4 days and his PCP obtain a chest x-ray yesterday that was equivocal.  He denies having any increased shortness of breath.  A screening portable view chest x-ray here shows subcutaneous emphysema to the right axillary region of uncertain etiology.  No definitive pneumothorax needle mediastinum was observed.  No evidence of pneumonia.  Plan to obtain a chest CT for further evaluation.  11:19 AM CT scan demonstrate a small right-sided pneumothorax with moderate right-sided subcutaneous chest wall gas.  There is a right lower lobe pulmonary nodule measures 7 mm.  Patient is entirely asymptomatic.  No evidence of hypoxia on room air.  Will consult  cardiothoracic surgeon for recommendation.  11:32 AM Appreciate consultation from CT surgeon Dr. who recommend outpt f/u in a few days for repeat CXR.  Pt understand to return if condition worsen.  He's able to ambulate without difficulty and no hypoxia.  Stable for discharge.  Care discussed with DR. Pfeiffer.    Laneta Simmers, PA-C 03/30/17 1214    13/07/18, MD 03/30/17 1225

## 2017-03-30 NOTE — ED Notes (Signed)
Patient transported to CT 

## 2017-03-31 DIAGNOSIS — J449 Chronic obstructive pulmonary disease, unspecified: Secondary | ICD-10-CM | POA: Diagnosis not present

## 2017-04-01 ENCOUNTER — Other Ambulatory Visit: Payer: Self-pay | Admitting: Surgery

## 2017-04-01 DIAGNOSIS — J9383 Other pneumothorax: Secondary | ICD-10-CM

## 2017-04-01 DIAGNOSIS — J449 Chronic obstructive pulmonary disease, unspecified: Secondary | ICD-10-CM | POA: Diagnosis not present

## 2017-04-02 DIAGNOSIS — J449 Chronic obstructive pulmonary disease, unspecified: Secondary | ICD-10-CM | POA: Diagnosis not present

## 2017-04-03 DIAGNOSIS — J449 Chronic obstructive pulmonary disease, unspecified: Secondary | ICD-10-CM | POA: Diagnosis not present

## 2017-04-04 ENCOUNTER — Ambulatory Visit (INDEPENDENT_AMBULATORY_CARE_PROVIDER_SITE_OTHER): Payer: Medicare HMO | Admitting: Physician Assistant

## 2017-04-04 ENCOUNTER — Encounter: Payer: Self-pay | Admitting: Physician Assistant

## 2017-04-04 ENCOUNTER — Ambulatory Visit
Admission: RE | Admit: 2017-04-04 | Discharge: 2017-04-04 | Disposition: A | Discharge status: Home / Self Care | Payer: Medicare HMO | Source: Ambulatory Visit | Attending: Surgery | Admitting: Surgery

## 2017-04-04 ENCOUNTER — Other Ambulatory Visit: Payer: Self-pay

## 2017-04-04 VITALS — BP 154/96 | HR 72 | Ht 70.0 in | Wt 115.0 lb

## 2017-04-04 DIAGNOSIS — J449 Chronic obstructive pulmonary disease, unspecified: Secondary | ICD-10-CM | POA: Diagnosis not present

## 2017-04-04 DIAGNOSIS — J9383 Other pneumothorax: Secondary | ICD-10-CM

## 2017-04-04 DIAGNOSIS — J439 Emphysema, unspecified: Secondary | ICD-10-CM | POA: Diagnosis not present

## 2017-04-04 NOTE — Progress Notes (Signed)
  HPI:  Patient returns for routine follow up for a spontaneous right pneumothorax that he was found to have on 03/30/2017. Since hospital discharge the patient reports no shortness of breath. He said when he left the ED on 11/07 he felt "bubble wrap" on the right side of his chest. He does not feel that anymore.  Current Outpatient Medications  Medication Sig Dispense Refill  . allopurinol (ZYLOPRIM) 100 MG tablet Take 100 mg daily by mouth.    Marland Kitchen amLODipine (NORVASC) 5 MG tablet Take 5 mg daily by mouth.  0  . cetirizine (ZYRTEC) 10 MG tablet Take 10 mg daily by mouth.    . dolutegravir (TIVICAY) 50 MG tablet Take 1 tablet (50 mg total) by mouth daily. (Patient not taking: Reported on 03/30/2017) 30 tablet 5  . emtricitabine-tenofovir AF (DESCOVY) 200-25 MG tablet Take 1 tablet by mouth daily. (Patient not taking: Reported on 03/30/2017) 30 tablet 5  . gabapentin (NEURONTIN) 100 MG capsule Take 1 capsule (100 mg total) by mouth every 8 (eight) hours. (Patient not taking: Reported on 03/30/2017) 90 capsule 0  . Ledipasvir-Sofosbuvir (HARVONI) 90-400 MG TABS Take 1 tablet by mouth daily. (Patient not taking: Reported on 03/30/2017) 28 tablet 2  . lisinopril-hydrochlorothiazide (PRINZIDE,ZESTORETIC) 10-12.5 MG tablet Take 1 tablet daily by mouth.  0  . Melatonin 5 MG TABS Take 5 mg at bedtime by mouth.    . meloxicam (MOBIC) 15 MG tablet Take 1 tablet (15 mg total) by mouth daily. 30 tablet 0  . naproxen sodium (ALEVE) 220 MG tablet Take 220 mg daily as needed by mouth (pain/headache).    . VENTOLIN HFA 108 (90 Base) MCG/ACT inhaler Take 2 puffs 4 (four) times daily as needed by mouth for shortness of breath.  0  Vital Signs: BP 154/96, HR 72, Oxygenation 98% on room air.   Physical Exam: CV-RRR Pulmonary-Clear to auscultation bilaterally   Diagnostic Tests: CLINICAL DATA:  Spontaneous pneumothorax.  EXAM: CHEST  2 VIEW  COMPARISON:  CT chest and chest x-ray dated March 30, 2017.  FINDINGS: The cardiomediastinal silhouette is normal in size. Normal pulmonary vascularity. Emphysematous changes again noted. No focal consolidation, pleural effusion, or pneumothorax. Resolving subcutaneous emphysema in the right neck and chest wall. No acute osseous abnormality.  IMPRESSION: 1. No significant pneumothorax. Resolving subcutaneous emphysema in the right neck and chest wall. 2.  Emphysema (ICD10-J43.9).   Electronically Signed   By: Obie Dredge M.D.   On: 04/04/2017 13:28  Impression and Plan: I spoke with patient regarding "bubble wrap" he felt and explained it was subcutaneous emphysema and it was present on his right lateral chest wall and neck as he had a small right spontaneous pneumothorax. On x ray today, no pneumothorax and resolution of subcutaneous emphysema. Patient does inform me he has not smoked since being in the ED on 03/30/2017. I encouraged him to continue to be smoke free. I will discuss with Dr. Laneta Simmers regarding right lower lobe pulmonary nodule measures 7 mm that was found on CT scan while he was in ED on 03/30/2017 as this will require follow up. Patient instructed no strenuous activity for at least 2 more weeks and no flying for at least one month.  Ardelle Balls, PA-C [facility] [phone]

## 2017-04-04 NOTE — Patient Instructions (Signed)
No flying for at least 1 month and no lifting more than 10 pounds for at least 2 more weeks

## 2017-04-05 DIAGNOSIS — J449 Chronic obstructive pulmonary disease, unspecified: Secondary | ICD-10-CM | POA: Diagnosis not present

## 2017-04-06 DIAGNOSIS — J449 Chronic obstructive pulmonary disease, unspecified: Secondary | ICD-10-CM | POA: Diagnosis not present

## 2017-04-07 DIAGNOSIS — J449 Chronic obstructive pulmonary disease, unspecified: Secondary | ICD-10-CM | POA: Diagnosis not present

## 2017-04-08 DIAGNOSIS — J449 Chronic obstructive pulmonary disease, unspecified: Secondary | ICD-10-CM | POA: Diagnosis not present

## 2017-04-09 DIAGNOSIS — J449 Chronic obstructive pulmonary disease, unspecified: Secondary | ICD-10-CM | POA: Diagnosis not present

## 2017-04-10 DIAGNOSIS — J449 Chronic obstructive pulmonary disease, unspecified: Secondary | ICD-10-CM | POA: Diagnosis not present

## 2017-04-11 DIAGNOSIS — J449 Chronic obstructive pulmonary disease, unspecified: Secondary | ICD-10-CM | POA: Diagnosis not present

## 2017-04-12 DIAGNOSIS — J449 Chronic obstructive pulmonary disease, unspecified: Secondary | ICD-10-CM | POA: Diagnosis not present

## 2017-04-13 DIAGNOSIS — J449 Chronic obstructive pulmonary disease, unspecified: Secondary | ICD-10-CM | POA: Diagnosis not present

## 2017-04-14 DIAGNOSIS — J449 Chronic obstructive pulmonary disease, unspecified: Secondary | ICD-10-CM | POA: Diagnosis not present

## 2017-04-15 DIAGNOSIS — J449 Chronic obstructive pulmonary disease, unspecified: Secondary | ICD-10-CM | POA: Diagnosis not present

## 2017-04-16 DIAGNOSIS — J449 Chronic obstructive pulmonary disease, unspecified: Secondary | ICD-10-CM | POA: Diagnosis not present

## 2017-04-17 DIAGNOSIS — J449 Chronic obstructive pulmonary disease, unspecified: Secondary | ICD-10-CM | POA: Diagnosis not present

## 2017-04-18 DIAGNOSIS — J449 Chronic obstructive pulmonary disease, unspecified: Secondary | ICD-10-CM | POA: Diagnosis not present

## 2017-04-19 DIAGNOSIS — J449 Chronic obstructive pulmonary disease, unspecified: Secondary | ICD-10-CM | POA: Diagnosis not present

## 2017-04-20 DIAGNOSIS — J449 Chronic obstructive pulmonary disease, unspecified: Secondary | ICD-10-CM | POA: Diagnosis not present

## 2017-04-21 DIAGNOSIS — J449 Chronic obstructive pulmonary disease, unspecified: Secondary | ICD-10-CM | POA: Diagnosis not present

## 2017-04-22 DIAGNOSIS — J449 Chronic obstructive pulmonary disease, unspecified: Secondary | ICD-10-CM | POA: Diagnosis not present

## 2017-04-23 DIAGNOSIS — J449 Chronic obstructive pulmonary disease, unspecified: Secondary | ICD-10-CM | POA: Diagnosis not present

## 2017-04-24 DIAGNOSIS — J449 Chronic obstructive pulmonary disease, unspecified: Secondary | ICD-10-CM | POA: Diagnosis not present

## 2017-04-25 DIAGNOSIS — J449 Chronic obstructive pulmonary disease, unspecified: Secondary | ICD-10-CM | POA: Diagnosis not present

## 2017-04-26 DIAGNOSIS — J449 Chronic obstructive pulmonary disease, unspecified: Secondary | ICD-10-CM | POA: Diagnosis not present

## 2017-04-27 DIAGNOSIS — J449 Chronic obstructive pulmonary disease, unspecified: Secondary | ICD-10-CM | POA: Diagnosis not present

## 2017-04-28 DIAGNOSIS — J449 Chronic obstructive pulmonary disease, unspecified: Secondary | ICD-10-CM | POA: Diagnosis not present

## 2017-04-29 DIAGNOSIS — J449 Chronic obstructive pulmonary disease, unspecified: Secondary | ICD-10-CM | POA: Diagnosis not present

## 2017-04-30 DIAGNOSIS — J449 Chronic obstructive pulmonary disease, unspecified: Secondary | ICD-10-CM | POA: Diagnosis not present

## 2017-05-01 DIAGNOSIS — J449 Chronic obstructive pulmonary disease, unspecified: Secondary | ICD-10-CM | POA: Diagnosis not present

## 2017-05-02 DIAGNOSIS — J449 Chronic obstructive pulmonary disease, unspecified: Secondary | ICD-10-CM | POA: Diagnosis not present

## 2017-05-03 DIAGNOSIS — J449 Chronic obstructive pulmonary disease, unspecified: Secondary | ICD-10-CM | POA: Diagnosis not present

## 2017-05-04 DIAGNOSIS — J449 Chronic obstructive pulmonary disease, unspecified: Secondary | ICD-10-CM | POA: Diagnosis not present

## 2017-05-05 DIAGNOSIS — J449 Chronic obstructive pulmonary disease, unspecified: Secondary | ICD-10-CM | POA: Diagnosis not present

## 2017-05-06 DIAGNOSIS — J449 Chronic obstructive pulmonary disease, unspecified: Secondary | ICD-10-CM | POA: Diagnosis not present

## 2017-05-07 DIAGNOSIS — J449 Chronic obstructive pulmonary disease, unspecified: Secondary | ICD-10-CM | POA: Diagnosis not present

## 2017-05-08 DIAGNOSIS — J449 Chronic obstructive pulmonary disease, unspecified: Secondary | ICD-10-CM | POA: Diagnosis not present

## 2017-05-09 DIAGNOSIS — J449 Chronic obstructive pulmonary disease, unspecified: Secondary | ICD-10-CM | POA: Diagnosis not present

## 2017-05-10 DIAGNOSIS — J449 Chronic obstructive pulmonary disease, unspecified: Secondary | ICD-10-CM | POA: Diagnosis not present

## 2017-05-11 DIAGNOSIS — J449 Chronic obstructive pulmonary disease, unspecified: Secondary | ICD-10-CM | POA: Diagnosis not present

## 2017-05-12 DIAGNOSIS — J449 Chronic obstructive pulmonary disease, unspecified: Secondary | ICD-10-CM | POA: Diagnosis not present

## 2017-05-13 DIAGNOSIS — J449 Chronic obstructive pulmonary disease, unspecified: Secondary | ICD-10-CM | POA: Diagnosis not present

## 2017-05-14 DIAGNOSIS — J449 Chronic obstructive pulmonary disease, unspecified: Secondary | ICD-10-CM | POA: Diagnosis not present

## 2017-05-15 DIAGNOSIS — J449 Chronic obstructive pulmonary disease, unspecified: Secondary | ICD-10-CM | POA: Diagnosis not present

## 2017-05-16 DIAGNOSIS — J449 Chronic obstructive pulmonary disease, unspecified: Secondary | ICD-10-CM | POA: Diagnosis not present

## 2017-05-17 DIAGNOSIS — J449 Chronic obstructive pulmonary disease, unspecified: Secondary | ICD-10-CM | POA: Diagnosis not present

## 2017-05-18 DIAGNOSIS — J449 Chronic obstructive pulmonary disease, unspecified: Secondary | ICD-10-CM | POA: Diagnosis not present

## 2017-05-19 DIAGNOSIS — J449 Chronic obstructive pulmonary disease, unspecified: Secondary | ICD-10-CM | POA: Diagnosis not present

## 2017-05-20 DIAGNOSIS — J449 Chronic obstructive pulmonary disease, unspecified: Secondary | ICD-10-CM | POA: Diagnosis not present

## 2017-05-21 DIAGNOSIS — J449 Chronic obstructive pulmonary disease, unspecified: Secondary | ICD-10-CM | POA: Diagnosis not present

## 2017-05-22 DIAGNOSIS — J449 Chronic obstructive pulmonary disease, unspecified: Secondary | ICD-10-CM | POA: Diagnosis not present

## 2017-05-23 DIAGNOSIS — J449 Chronic obstructive pulmonary disease, unspecified: Secondary | ICD-10-CM | POA: Diagnosis not present

## 2017-05-24 DIAGNOSIS — J449 Chronic obstructive pulmonary disease, unspecified: Secondary | ICD-10-CM | POA: Diagnosis not present

## 2017-05-25 DIAGNOSIS — J449 Chronic obstructive pulmonary disease, unspecified: Secondary | ICD-10-CM | POA: Diagnosis not present

## 2017-05-26 DIAGNOSIS — J449 Chronic obstructive pulmonary disease, unspecified: Secondary | ICD-10-CM | POA: Diagnosis not present

## 2017-05-27 DIAGNOSIS — J449 Chronic obstructive pulmonary disease, unspecified: Secondary | ICD-10-CM | POA: Diagnosis not present

## 2017-05-28 DIAGNOSIS — J449 Chronic obstructive pulmonary disease, unspecified: Secondary | ICD-10-CM | POA: Diagnosis not present

## 2017-05-29 DIAGNOSIS — J449 Chronic obstructive pulmonary disease, unspecified: Secondary | ICD-10-CM | POA: Diagnosis not present

## 2017-05-30 DIAGNOSIS — J449 Chronic obstructive pulmonary disease, unspecified: Secondary | ICD-10-CM | POA: Diagnosis not present

## 2017-05-31 DIAGNOSIS — J449 Chronic obstructive pulmonary disease, unspecified: Secondary | ICD-10-CM | POA: Diagnosis not present

## 2017-06-01 DIAGNOSIS — J449 Chronic obstructive pulmonary disease, unspecified: Secondary | ICD-10-CM | POA: Diagnosis not present

## 2017-06-02 DIAGNOSIS — J449 Chronic obstructive pulmonary disease, unspecified: Secondary | ICD-10-CM | POA: Diagnosis not present

## 2017-06-03 DIAGNOSIS — J449 Chronic obstructive pulmonary disease, unspecified: Secondary | ICD-10-CM | POA: Diagnosis not present

## 2017-06-04 DIAGNOSIS — J449 Chronic obstructive pulmonary disease, unspecified: Secondary | ICD-10-CM | POA: Diagnosis not present

## 2017-06-05 DIAGNOSIS — J449 Chronic obstructive pulmonary disease, unspecified: Secondary | ICD-10-CM | POA: Diagnosis not present

## 2017-06-06 DIAGNOSIS — J449 Chronic obstructive pulmonary disease, unspecified: Secondary | ICD-10-CM | POA: Diagnosis not present

## 2017-06-07 DIAGNOSIS — J449 Chronic obstructive pulmonary disease, unspecified: Secondary | ICD-10-CM | POA: Diagnosis not present

## 2017-06-08 DIAGNOSIS — J449 Chronic obstructive pulmonary disease, unspecified: Secondary | ICD-10-CM | POA: Diagnosis not present

## 2017-06-09 DIAGNOSIS — J449 Chronic obstructive pulmonary disease, unspecified: Secondary | ICD-10-CM | POA: Diagnosis not present

## 2017-06-10 DIAGNOSIS — J449 Chronic obstructive pulmonary disease, unspecified: Secondary | ICD-10-CM | POA: Diagnosis not present

## 2017-06-11 DIAGNOSIS — J449 Chronic obstructive pulmonary disease, unspecified: Secondary | ICD-10-CM | POA: Diagnosis not present

## 2017-06-12 DIAGNOSIS — J449 Chronic obstructive pulmonary disease, unspecified: Secondary | ICD-10-CM | POA: Diagnosis not present

## 2017-06-13 DIAGNOSIS — J449 Chronic obstructive pulmonary disease, unspecified: Secondary | ICD-10-CM | POA: Diagnosis not present

## 2017-06-14 DIAGNOSIS — J449 Chronic obstructive pulmonary disease, unspecified: Secondary | ICD-10-CM | POA: Diagnosis not present

## 2017-06-15 DIAGNOSIS — J449 Chronic obstructive pulmonary disease, unspecified: Secondary | ICD-10-CM | POA: Diagnosis not present

## 2017-06-16 DIAGNOSIS — J449 Chronic obstructive pulmonary disease, unspecified: Secondary | ICD-10-CM | POA: Diagnosis not present

## 2017-06-17 DIAGNOSIS — J449 Chronic obstructive pulmonary disease, unspecified: Secondary | ICD-10-CM | POA: Diagnosis not present

## 2017-06-18 DIAGNOSIS — J449 Chronic obstructive pulmonary disease, unspecified: Secondary | ICD-10-CM | POA: Diagnosis not present

## 2017-06-19 DIAGNOSIS — J449 Chronic obstructive pulmonary disease, unspecified: Secondary | ICD-10-CM | POA: Diagnosis not present

## 2017-06-20 DIAGNOSIS — J449 Chronic obstructive pulmonary disease, unspecified: Secondary | ICD-10-CM | POA: Diagnosis not present

## 2017-06-21 DIAGNOSIS — J449 Chronic obstructive pulmonary disease, unspecified: Secondary | ICD-10-CM | POA: Diagnosis not present

## 2017-06-22 DIAGNOSIS — J449 Chronic obstructive pulmonary disease, unspecified: Secondary | ICD-10-CM | POA: Diagnosis not present

## 2017-06-23 DIAGNOSIS — J449 Chronic obstructive pulmonary disease, unspecified: Secondary | ICD-10-CM | POA: Diagnosis not present

## 2017-06-24 DIAGNOSIS — J449 Chronic obstructive pulmonary disease, unspecified: Secondary | ICD-10-CM | POA: Diagnosis not present

## 2017-06-25 DIAGNOSIS — J449 Chronic obstructive pulmonary disease, unspecified: Secondary | ICD-10-CM | POA: Diagnosis not present

## 2017-06-26 DIAGNOSIS — J449 Chronic obstructive pulmonary disease, unspecified: Secondary | ICD-10-CM | POA: Diagnosis not present

## 2017-06-27 DIAGNOSIS — J449 Chronic obstructive pulmonary disease, unspecified: Secondary | ICD-10-CM | POA: Diagnosis not present

## 2017-06-28 DIAGNOSIS — J449 Chronic obstructive pulmonary disease, unspecified: Secondary | ICD-10-CM | POA: Diagnosis not present

## 2017-06-29 DIAGNOSIS — J449 Chronic obstructive pulmonary disease, unspecified: Secondary | ICD-10-CM | POA: Diagnosis not present

## 2017-06-30 DIAGNOSIS — J449 Chronic obstructive pulmonary disease, unspecified: Secondary | ICD-10-CM | POA: Diagnosis not present

## 2017-07-01 DIAGNOSIS — J449 Chronic obstructive pulmonary disease, unspecified: Secondary | ICD-10-CM | POA: Diagnosis not present

## 2017-07-02 DIAGNOSIS — J449 Chronic obstructive pulmonary disease, unspecified: Secondary | ICD-10-CM | POA: Diagnosis not present

## 2017-07-03 DIAGNOSIS — J449 Chronic obstructive pulmonary disease, unspecified: Secondary | ICD-10-CM | POA: Diagnosis not present

## 2017-07-04 DIAGNOSIS — J449 Chronic obstructive pulmonary disease, unspecified: Secondary | ICD-10-CM | POA: Diagnosis not present

## 2017-07-05 DIAGNOSIS — J449 Chronic obstructive pulmonary disease, unspecified: Secondary | ICD-10-CM | POA: Diagnosis not present

## 2017-07-06 DIAGNOSIS — J449 Chronic obstructive pulmonary disease, unspecified: Secondary | ICD-10-CM | POA: Diagnosis not present

## 2017-07-07 DIAGNOSIS — J449 Chronic obstructive pulmonary disease, unspecified: Secondary | ICD-10-CM | POA: Diagnosis not present

## 2017-07-08 DIAGNOSIS — J449 Chronic obstructive pulmonary disease, unspecified: Secondary | ICD-10-CM | POA: Diagnosis not present

## 2017-07-09 DIAGNOSIS — J449 Chronic obstructive pulmonary disease, unspecified: Secondary | ICD-10-CM | POA: Diagnosis not present

## 2017-07-10 DIAGNOSIS — J449 Chronic obstructive pulmonary disease, unspecified: Secondary | ICD-10-CM | POA: Diagnosis not present

## 2017-07-11 DIAGNOSIS — J449 Chronic obstructive pulmonary disease, unspecified: Secondary | ICD-10-CM | POA: Diagnosis not present

## 2017-07-12 DIAGNOSIS — J449 Chronic obstructive pulmonary disease, unspecified: Secondary | ICD-10-CM | POA: Diagnosis not present

## 2017-07-13 DIAGNOSIS — J449 Chronic obstructive pulmonary disease, unspecified: Secondary | ICD-10-CM | POA: Diagnosis not present

## 2017-07-14 DIAGNOSIS — J449 Chronic obstructive pulmonary disease, unspecified: Secondary | ICD-10-CM | POA: Diagnosis not present

## 2017-07-15 DIAGNOSIS — J449 Chronic obstructive pulmonary disease, unspecified: Secondary | ICD-10-CM | POA: Diagnosis not present

## 2017-07-16 DIAGNOSIS — J449 Chronic obstructive pulmonary disease, unspecified: Secondary | ICD-10-CM | POA: Diagnosis not present

## 2017-07-17 DIAGNOSIS — J449 Chronic obstructive pulmonary disease, unspecified: Secondary | ICD-10-CM | POA: Diagnosis not present

## 2017-07-18 DIAGNOSIS — J449 Chronic obstructive pulmonary disease, unspecified: Secondary | ICD-10-CM | POA: Diagnosis not present

## 2017-07-19 DIAGNOSIS — J449 Chronic obstructive pulmonary disease, unspecified: Secondary | ICD-10-CM | POA: Diagnosis not present

## 2017-07-20 DIAGNOSIS — J449 Chronic obstructive pulmonary disease, unspecified: Secondary | ICD-10-CM | POA: Diagnosis not present

## 2017-07-21 DIAGNOSIS — J449 Chronic obstructive pulmonary disease, unspecified: Secondary | ICD-10-CM | POA: Diagnosis not present

## 2017-12-07 ENCOUNTER — Inpatient Hospital Stay (HOSPITAL_COMMUNITY): Payer: Medicare Other

## 2017-12-07 ENCOUNTER — Emergency Department (HOSPITAL_COMMUNITY): Payer: Medicare Other

## 2017-12-07 ENCOUNTER — Inpatient Hospital Stay (HOSPITAL_COMMUNITY)
Admission: EM | Admit: 2017-12-07 | Discharge: 2017-12-27 | DRG: 974 | Disposition: A | Discharge status: Skilled Nursing Facility | Payer: Medicare Other | Attending: Internal Medicine | Admitting: Internal Medicine

## 2017-12-07 ENCOUNTER — Other Ambulatory Visit: Payer: Self-pay

## 2017-12-07 ENCOUNTER — Encounter (HOSPITAL_COMMUNITY): Payer: Self-pay | Admitting: Emergency Medicine

## 2017-12-07 DIAGNOSIS — I361 Nonrheumatic tricuspid (valve) insufficiency: Secondary | ICD-10-CM | POA: Diagnosis not present

## 2017-12-07 DIAGNOSIS — Z681 Body mass index (BMI) 19 or less, adult: Secondary | ICD-10-CM

## 2017-12-07 DIAGNOSIS — K0889 Other specified disorders of teeth and supporting structures: Secondary | ICD-10-CM

## 2017-12-07 DIAGNOSIS — Z9119 Patient's noncompliance with other medical treatment and regimen: Secondary | ICD-10-CM | POA: Diagnosis not present

## 2017-12-07 DIAGNOSIS — Z9181 History of falling: Secondary | ICD-10-CM

## 2017-12-07 DIAGNOSIS — J90 Pleural effusion, not elsewhere classified: Secondary | ICD-10-CM | POA: Diagnosis present

## 2017-12-07 DIAGNOSIS — I10 Essential (primary) hypertension: Secondary | ICD-10-CM | POA: Diagnosis not present

## 2017-12-07 DIAGNOSIS — E872 Acidosis: Secondary | ICD-10-CM | POA: Diagnosis present

## 2017-12-07 DIAGNOSIS — Z720 Tobacco use: Secondary | ICD-10-CM | POA: Diagnosis present

## 2017-12-07 DIAGNOSIS — J181 Lobar pneumonia, unspecified organism: Secondary | ICD-10-CM

## 2017-12-07 DIAGNOSIS — L8990 Pressure ulcer of unspecified site, unspecified stage: Secondary | ICD-10-CM | POA: Diagnosis not present

## 2017-12-07 DIAGNOSIS — B1920 Unspecified viral hepatitis C without hepatic coma: Secondary | ICD-10-CM | POA: Diagnosis not present

## 2017-12-07 DIAGNOSIS — R0602 Shortness of breath: Secondary | ICD-10-CM

## 2017-12-07 DIAGNOSIS — F1911 Other psychoactive substance abuse, in remission: Secondary | ICD-10-CM | POA: Diagnosis not present

## 2017-12-07 DIAGNOSIS — Z9889 Other specified postprocedural states: Secondary | ICD-10-CM

## 2017-12-07 DIAGNOSIS — R0603 Acute respiratory distress: Secondary | ICD-10-CM | POA: Diagnosis not present

## 2017-12-07 DIAGNOSIS — E8809 Other disorders of plasma-protein metabolism, not elsewhere classified: Secondary | ICD-10-CM | POA: Diagnosis present

## 2017-12-07 DIAGNOSIS — R64 Cachexia: Secondary | ICD-10-CM | POA: Diagnosis present

## 2017-12-07 DIAGNOSIS — R609 Edema, unspecified: Secondary | ICD-10-CM

## 2017-12-07 DIAGNOSIS — J440 Chronic obstructive pulmonary disease with acute lower respiratory infection: Secondary | ICD-10-CM | POA: Diagnosis present

## 2017-12-07 DIAGNOSIS — Z6821 Body mass index (BMI) 21.0-21.9, adult: Secondary | ICD-10-CM | POA: Diagnosis not present

## 2017-12-07 DIAGNOSIS — F1910 Other psychoactive substance abuse, uncomplicated: Secondary | ICD-10-CM | POA: Diagnosis present

## 2017-12-07 DIAGNOSIS — N179 Acute kidney failure, unspecified: Secondary | ICD-10-CM | POA: Diagnosis present

## 2017-12-07 DIAGNOSIS — A419 Sepsis, unspecified organism: Secondary | ICD-10-CM | POA: Diagnosis present

## 2017-12-07 DIAGNOSIS — I480 Paroxysmal atrial fibrillation: Secondary | ICD-10-CM | POA: Diagnosis present

## 2017-12-07 DIAGNOSIS — B182 Chronic viral hepatitis C: Secondary | ICD-10-CM | POA: Diagnosis not present

## 2017-12-07 DIAGNOSIS — J9383 Other pneumothorax: Secondary | ICD-10-CM | POA: Diagnosis present

## 2017-12-07 DIAGNOSIS — J189 Pneumonia, unspecified organism: Secondary | ICD-10-CM | POA: Diagnosis not present

## 2017-12-07 DIAGNOSIS — Z9114 Patient's other noncompliance with medication regimen: Secondary | ICD-10-CM | POA: Diagnosis not present

## 2017-12-07 DIAGNOSIS — Z87891 Personal history of nicotine dependence: Secondary | ICD-10-CM | POA: Diagnosis not present

## 2017-12-07 DIAGNOSIS — R0600 Dyspnea, unspecified: Secondary | ICD-10-CM

## 2017-12-07 DIAGNOSIS — R0902 Hypoxemia: Secondary | ICD-10-CM

## 2017-12-07 DIAGNOSIS — I5023 Acute on chronic systolic (congestive) heart failure: Secondary | ICD-10-CM | POA: Diagnosis not present

## 2017-12-07 DIAGNOSIS — J918 Pleural effusion in other conditions classified elsewhere: Secondary | ICD-10-CM | POA: Diagnosis present

## 2017-12-07 DIAGNOSIS — Z8619 Personal history of other infectious and parasitic diseases: Secondary | ICD-10-CM | POA: Diagnosis not present

## 2017-12-07 DIAGNOSIS — R6520 Severe sepsis without septic shock: Secondary | ICD-10-CM | POA: Diagnosis present

## 2017-12-07 DIAGNOSIS — J14 Pneumonia due to Hemophilus influenzae: Secondary | ICD-10-CM | POA: Diagnosis not present

## 2017-12-07 DIAGNOSIS — J9600 Acute respiratory failure, unspecified whether with hypoxia or hypercapnia: Secondary | ICD-10-CM | POA: Diagnosis present

## 2017-12-07 DIAGNOSIS — B20 Human immunodeficiency virus [HIV] disease: Secondary | ICD-10-CM | POA: Diagnosis present

## 2017-12-07 DIAGNOSIS — E871 Hypo-osmolality and hyponatremia: Secondary | ICD-10-CM | POA: Diagnosis present

## 2017-12-07 DIAGNOSIS — B963 Hemophilus influenzae [H. influenzae] as the cause of diseases classified elsewhere: Secondary | ICD-10-CM | POA: Diagnosis not present

## 2017-12-07 DIAGNOSIS — R000 Tachycardia, unspecified: Secondary | ICD-10-CM | POA: Diagnosis not present

## 2017-12-07 DIAGNOSIS — I43 Cardiomyopathy in diseases classified elsewhere: Secondary | ICD-10-CM | POA: Diagnosis present

## 2017-12-07 DIAGNOSIS — L8992 Pressure ulcer of unspecified site, stage 2: Secondary | ICD-10-CM | POA: Diagnosis not present

## 2017-12-07 DIAGNOSIS — J851 Abscess of lung with pneumonia: Secondary | ICD-10-CM | POA: Diagnosis present

## 2017-12-07 DIAGNOSIS — J869 Pyothorax without fistula: Secondary | ICD-10-CM

## 2017-12-07 DIAGNOSIS — I110 Hypertensive heart disease with heart failure: Secondary | ICD-10-CM | POA: Diagnosis present

## 2017-12-07 DIAGNOSIS — E860 Dehydration: Secondary | ICD-10-CM | POA: Diagnosis present

## 2017-12-07 DIAGNOSIS — M25551 Pain in right hip: Secondary | ICD-10-CM

## 2017-12-07 DIAGNOSIS — Z79899 Other long term (current) drug therapy: Secondary | ICD-10-CM

## 2017-12-07 DIAGNOSIS — L89152 Pressure ulcer of sacral region, stage 2: Secondary | ICD-10-CM | POA: Diagnosis present

## 2017-12-07 DIAGNOSIS — I959 Hypotension, unspecified: Secondary | ICD-10-CM | POA: Diagnosis present

## 2017-12-07 DIAGNOSIS — J852 Abscess of lung without pneumonia: Secondary | ICD-10-CM | POA: Diagnosis not present

## 2017-12-07 DIAGNOSIS — E43 Unspecified severe protein-calorie malnutrition: Secondary | ICD-10-CM | POA: Diagnosis present

## 2017-12-07 DIAGNOSIS — Z978 Presence of other specified devices: Secondary | ICD-10-CM | POA: Diagnosis not present

## 2017-12-07 DIAGNOSIS — E876 Hypokalemia: Secondary | ICD-10-CM | POA: Diagnosis not present

## 2017-12-07 DIAGNOSIS — Z66 Do not resuscitate: Secondary | ICD-10-CM | POA: Diagnosis present

## 2017-12-07 DIAGNOSIS — N5089 Other specified disorders of the male genital organs: Secondary | ICD-10-CM | POA: Diagnosis present

## 2017-12-07 DIAGNOSIS — Z95828 Presence of other vascular implants and grafts: Secondary | ICD-10-CM | POA: Diagnosis not present

## 2017-12-07 DIAGNOSIS — G4700 Insomnia, unspecified: Secondary | ICD-10-CM | POA: Diagnosis present

## 2017-12-07 DIAGNOSIS — Z791 Long term (current) use of non-steroidal anti-inflammatories (NSAID): Secondary | ICD-10-CM

## 2017-12-07 DIAGNOSIS — R740 Nonspecific elevation of levels of transaminase and lactic acid dehydrogenase [LDH]: Secondary | ICD-10-CM | POA: Diagnosis not present

## 2017-12-07 DIAGNOSIS — N4889 Other specified disorders of penis: Secondary | ICD-10-CM | POA: Diagnosis not present

## 2017-12-07 DIAGNOSIS — J449 Chronic obstructive pulmonary disease, unspecified: Secondary | ICD-10-CM | POA: Diagnosis not present

## 2017-12-07 DIAGNOSIS — R627 Adult failure to thrive: Secondary | ICD-10-CM | POA: Diagnosis present

## 2017-12-07 HISTORY — DX: Chronic obstructive pulmonary disease, unspecified: J44.9

## 2017-12-07 HISTORY — DX: Pneumonia, unspecified organism: J18.9

## 2017-12-07 HISTORY — DX: Unspecified osteoarthritis, unspecified site: M19.90

## 2017-12-07 HISTORY — DX: Chronic viral hepatitis C: B18.2

## 2017-12-07 LAB — RAPID URINE DRUG SCREEN, HOSP PERFORMED
AMPHETAMINES: NOT DETECTED
BENZODIAZEPINES: NOT DETECTED
Cocaine: NOT DETECTED
OPIATES: NOT DETECTED
TETRAHYDROCANNABINOL: NOT DETECTED

## 2017-12-07 LAB — URINALYSIS, ROUTINE W REFLEX MICROSCOPIC
Bacteria, UA: NONE SEEN
Bilirubin Urine: NEGATIVE
GLUCOSE, UA: NEGATIVE mg/dL
Ketones, ur: NEGATIVE mg/dL
Leukocytes, UA: NEGATIVE
Nitrite: NEGATIVE
PH: 6 (ref 5.0–8.0)
Protein, ur: NEGATIVE mg/dL
SPECIFIC GRAVITY, URINE: 1.01 (ref 1.005–1.030)

## 2017-12-07 LAB — CBC WITH DIFFERENTIAL/PLATELET
BAND NEUTROPHILS: 0 %
BASOS PCT: 0 %
Basophils Absolute: 0 10*3/uL (ref 0.0–0.1)
Blasts: 0 %
EOS ABS: 0 10*3/uL (ref 0.0–0.7)
EOS PCT: 0 %
HEMATOCRIT: 30.3 % — AB (ref 39.0–52.0)
HEMOGLOBIN: 10.4 g/dL — AB (ref 13.0–17.0)
LYMPHS ABS: 1.1 10*3/uL (ref 0.7–4.0)
Lymphocytes Relative: 4 %
MCH: 32.7 pg (ref 26.0–34.0)
MCHC: 34.3 g/dL (ref 30.0–36.0)
MCV: 95.3 fL (ref 78.0–100.0)
METAMYELOCYTES PCT: 0 %
MONO ABS: 0 10*3/uL — AB (ref 0.1–1.0)
MONOS PCT: 0 %
MYELOCYTES: 0 %
NEUTROS ABS: 26.3 10*3/uL — AB (ref 1.7–7.7)
Neutrophils Relative %: 96 %
Other: 0 %
Platelets: 541 10*3/uL — ABNORMAL HIGH (ref 150–400)
Promyelocytes Relative: 0 %
RBC: 3.18 MIL/uL — AB (ref 4.22–5.81)
RDW: 11.3 % — ABNORMAL LOW (ref 11.5–15.5)
WBC: 27.4 10*3/uL — ABNORMAL HIGH (ref 4.0–10.5)
nRBC: 0 /100 WBC

## 2017-12-07 LAB — I-STAT CG4 LACTIC ACID, ED
LACTIC ACID, VENOUS: 2.42 mmol/L — AB (ref 0.5–1.9)
Lactic Acid, Venous: 2.99 mmol/L (ref 0.5–1.9)

## 2017-12-07 LAB — BASIC METABOLIC PANEL
Anion gap: 10 (ref 5–15)
BUN: 35 mg/dL — ABNORMAL HIGH (ref 8–23)
CALCIUM: 7.6 mg/dL — AB (ref 8.9–10.3)
CHLORIDE: 100 mmol/L (ref 98–111)
CO2: 18 mmol/L — ABNORMAL LOW (ref 22–32)
CREATININE: 1.13 mg/dL (ref 0.61–1.24)
Glucose, Bld: 131 mg/dL — ABNORMAL HIGH (ref 70–99)
Potassium: 4 mmol/L (ref 3.5–5.1)
SODIUM: 128 mmol/L — AB (ref 135–145)

## 2017-12-07 LAB — COMPREHENSIVE METABOLIC PANEL
ALBUMIN: 2 g/dL — AB (ref 3.5–5.0)
ALT: 25 U/L (ref 0–44)
ANION GAP: 13 (ref 5–15)
AST: 55 U/L — ABNORMAL HIGH (ref 15–41)
Alkaline Phosphatase: 42 U/L (ref 38–126)
BILIRUBIN TOTAL: 1.1 mg/dL (ref 0.3–1.2)
BUN: 43 mg/dL — ABNORMAL HIGH (ref 8–23)
CHLORIDE: 90 mmol/L — AB (ref 98–111)
CO2: 19 mmol/L — ABNORMAL LOW (ref 22–32)
CREATININE: 1.71 mg/dL — AB (ref 0.61–1.24)
Calcium: 8.2 mg/dL — ABNORMAL LOW (ref 8.9–10.3)
GFR calc non Af Amer: 40 mL/min — ABNORMAL LOW (ref >60)
GFR, EST AFRICAN AMERICAN: 46 mL/min — AB (ref >60)
Glucose, Bld: 122 mg/dL — ABNORMAL HIGH (ref 70–99)
POTASSIUM: 4 mmol/L (ref 3.5–5.1)
Sodium: 122 mmol/L — ABNORMAL LOW (ref 135–145)
Total Protein: 6.4 g/dL — ABNORMAL LOW (ref 6.5–8.1)

## 2017-12-07 LAB — CREATININE, URINE, RANDOM: CREATININE, URINE: 71.33 mg/dL

## 2017-12-07 LAB — T-HELPER CELLS (CD4) COUNT (NOT AT ARMC)
CD4 T CELL ABS: 90 /uL — AB (ref 400–2700)
CD4 T CELL HELPER: 22 % — AB (ref 33–55)

## 2017-12-07 LAB — PROTIME-INR
INR: 1.26
Prothrombin Time: 15.6 seconds — ABNORMAL HIGH (ref 11.4–15.2)

## 2017-12-07 LAB — PROCALCITONIN: Procalcitonin: 4.77 ng/mL

## 2017-12-07 LAB — I-STAT TROPONIN, ED: TROPONIN I, POC: 0 ng/mL (ref 0.00–0.08)

## 2017-12-07 LAB — SODIUM, URINE, RANDOM: SODIUM UR: 24 mmol/L

## 2017-12-07 LAB — APTT: APTT: 39 s — AB (ref 24–36)

## 2017-12-07 LAB — LACTIC ACID, PLASMA
LACTIC ACID, VENOUS: 1.9 mmol/L (ref 0.5–1.9)
LACTIC ACID, VENOUS: 2.2 mmol/L — AB (ref 0.5–1.9)

## 2017-12-07 MED ORDER — ZOLPIDEM TARTRATE 5 MG PO TABS
5.0000 mg | ORAL_TABLET | Freq: Once | ORAL | Status: AC
  Administered 2017-12-07: 5 mg via ORAL
  Filled 2017-12-07: qty 1

## 2017-12-07 MED ORDER — LABETALOL HCL 5 MG/ML IV SOLN
5.0000 mg | INTRAVENOUS | Status: DC | PRN
  Administered 2017-12-07 – 2017-12-10 (×4): 5 mg via INTRAVENOUS
  Filled 2017-12-07 (×4): qty 4

## 2017-12-07 MED ORDER — SODIUM CHLORIDE 0.9 % IV SOLN
INTRAVENOUS | Status: DC
  Administered 2017-12-07 – 2017-12-08 (×4): via INTRAVENOUS
  Administered 2017-12-09: 125 mL/h via INTRAVENOUS

## 2017-12-07 MED ORDER — ONDANSETRON HCL 4 MG PO TABS: 4.0000 mg | ORAL_TABLET | Freq: Four times a day (QID) | ORAL | Status: DC | PRN

## 2017-12-07 MED ORDER — BENZONATATE 100 MG PO CAPS
100.0000 mg | ORAL_CAPSULE | Freq: Three times a day (TID) | ORAL | Status: DC | PRN
  Administered 2017-12-07 – 2017-12-08 (×3): 100 mg via ORAL
  Filled 2017-12-07 (×3): qty 1

## 2017-12-07 MED ORDER — SODIUM CHLORIDE 0.9 % IV SOLN: 2.0000 g | INTRAVENOUS | Status: DC

## 2017-12-07 MED ORDER — BICTEGRAVIR-EMTRICITAB-TENOFOV 50-200-25 MG PO TABS
1.0000 | ORAL_TABLET | Freq: Every day | ORAL | Status: DC
  Administered 2017-12-07 – 2017-12-27 (×21): 1 via ORAL
  Filled 2017-12-07 (×21): qty 1

## 2017-12-07 MED ORDER — SODIUM CHLORIDE 0.9 % IV SOLN: 500.0000 mg | INTRAVENOUS | Status: DC

## 2017-12-07 MED ORDER — SODIUM CHLORIDE 0.9 % IV BOLUS
1000.0000 mL | Freq: Once | INTRAVENOUS | Status: AC
  Administered 2017-12-07: 1000 mL via INTRAVENOUS

## 2017-12-07 MED ORDER — ONDANSETRON HCL 4 MG/2ML IJ SOLN: 4.0000 mg | Freq: Four times a day (QID) | INTRAMUSCULAR | Status: DC | PRN

## 2017-12-07 MED ORDER — HYDROMORPHONE HCL 1 MG/ML IJ SOLN: 0.5000 mg | INTRAMUSCULAR | Status: DC | PRN

## 2017-12-07 MED ORDER — SODIUM CHLORIDE 0.9 % IV BOLUS (SEPSIS)
1000.0000 mL | Freq: Once | INTRAVENOUS | Status: AC
  Administered 2017-12-07: 1000 mL via INTRAVENOUS

## 2017-12-07 MED ORDER — ALBUTEROL SULFATE (2.5 MG/3ML) 0.083% IN NEBU
2.5000 mg | INHALATION_SOLUTION | Freq: Four times a day (QID) | RESPIRATORY_TRACT | Status: DC | PRN
  Administered 2017-12-07: 2.5 mg via RESPIRATORY_TRACT
  Filled 2017-12-07: qty 3

## 2017-12-07 MED ORDER — SODIUM CHLORIDE 0.9 % IV BOLUS (SEPSIS)
500.0000 mL | Freq: Once | INTRAVENOUS | Status: AC
  Administered 2017-12-07: 500 mL via INTRAVENOUS

## 2017-12-07 MED ORDER — SODIUM CHLORIDE 0.9 % IV SOLN: Freq: Once | INTRAVENOUS | Status: DC

## 2017-12-07 MED ORDER — NICOTINE 14 MG/24HR TD PT24
14.0000 mg | MEDICATED_PATCH | Freq: Every day | TRANSDERMAL | Status: DC
  Administered 2017-12-07 – 2017-12-27 (×21): 14 mg via TRANSDERMAL
  Filled 2017-12-07 (×20): qty 1

## 2017-12-07 MED ORDER — ONDANSETRON HCL 4 MG/2ML IJ SOLN
4.0000 mg | Freq: Four times a day (QID) | INTRAMUSCULAR | Status: DC | PRN
  Administered 2017-12-12: 4 mg via INTRAVENOUS
  Filled 2017-12-07 (×2): qty 2

## 2017-12-07 MED ORDER — IBUPROFEN 200 MG PO TABS
400.0000 mg | ORAL_TABLET | Freq: Four times a day (QID) | ORAL | Status: DC | PRN
  Administered 2017-12-08 – 2017-12-12 (×4): 400 mg via ORAL
  Filled 2017-12-07 (×4): qty 2

## 2017-12-07 MED ORDER — CEFTRIAXONE SODIUM 2 G IJ SOLR
2.0000 g | INTRAMUSCULAR | Status: DC
  Administered 2017-12-07: 2 g via INTRAVENOUS
  Filled 2017-12-07: qty 20

## 2017-12-07 MED ORDER — GUAIFENESIN ER 600 MG PO TB12
600.0000 mg | ORAL_TABLET | Freq: Two times a day (BID) | ORAL | Status: DC
  Administered 2017-12-07 – 2017-12-27 (×41): 600 mg via ORAL
  Filled 2017-12-07 (×41): qty 1

## 2017-12-07 MED ORDER — THIAMINE HCL 100 MG/ML IJ SOLN
Freq: Once | INTRAVENOUS | Status: AC
  Administered 2017-12-07: 02:00:00 via INTRAVENOUS
  Filled 2017-12-07: qty 1000

## 2017-12-07 MED ORDER — ENSURE ENLIVE PO LIQD
237.0000 mL | Freq: Two times a day (BID) | ORAL | Status: DC
  Administered 2017-12-07 – 2017-12-08 (×3): 237 mL via ORAL

## 2017-12-07 MED ORDER — PIPERACILLIN-TAZOBACTAM 3.375 G IVPB
3.3750 g | Freq: Three times a day (TID) | INTRAVENOUS | Status: DC
  Administered 2017-12-07 – 2017-12-08 (×3): 3.375 g via INTRAVENOUS
  Filled 2017-12-07 (×4): qty 50

## 2017-12-07 MED ORDER — AZITHROMYCIN 500 MG IV SOLR
500.0000 mg | INTRAVENOUS | Status: DC
  Administered 2017-12-07: 500 mg via INTRAVENOUS
  Filled 2017-12-07: qty 500

## 2017-12-07 MED ORDER — VANCOMYCIN HCL 500 MG IV SOLR
500.0000 mg | Freq: Two times a day (BID) | INTRAVENOUS | Status: DC
  Administered 2017-12-08 – 2017-12-09 (×3): 500 mg via INTRAVENOUS
  Filled 2017-12-07 (×4): qty 500

## 2017-12-07 MED ORDER — SODIUM CHLORIDE 0.9 % IV BOLUS (SEPSIS)
250.0000 mL | Freq: Once | INTRAVENOUS | Status: AC
  Administered 2017-12-07: 250 mL via INTRAVENOUS

## 2017-12-07 MED ORDER — IOHEXOL 300 MG/ML  SOLN
100.0000 mL | Freq: Once | INTRAMUSCULAR | Status: AC
  Administered 2017-12-07: 100 mL via INTRAVENOUS

## 2017-12-07 MED ORDER — VANCOMYCIN HCL IN DEXTROSE 1-5 GM/200ML-% IV SOLN
1000.0000 mg | Freq: Once | INTRAVENOUS | Status: AC
  Administered 2017-12-07: 1000 mg via INTRAVENOUS
  Filled 2017-12-07: qty 200

## 2017-12-07 NOTE — ED Notes (Signed)
Pt in CT.

## 2017-12-07 NOTE — ED Triage Notes (Signed)
Patient is from home, having some shortness of breath, hypotension, altered mental status at home, cough, productive in nature.  Patient stated to EMS that he has had some dark urine.  Patient received 5mg  albuterol and 125mg  solumedrol by EMS.

## 2017-12-07 NOTE — Progress Notes (Signed)
CT chest shows RLL pna with associated abscess. Large loculated right pleural effusion suggestive of empyema. Will broaden antibiotic coverage to Vanco/Zosyn. Consider pulm and/or IR consult depending on how pt progresses.  Cyndee Brightly, NP-C [facility]  pgr [phone]

## 2017-12-07 NOTE — ED Notes (Signed)
Spoke with lab staff, states that urine drug screen has already been added onto previously collected urine

## 2017-12-07 NOTE — ED Notes (Signed)
Patient returned from CT and xray.

## 2017-12-07 NOTE — ED Notes (Signed)
Report given to Jas.

## 2017-12-07 NOTE — Progress Notes (Signed)
Pharmacy Antibiotic Note  Collins Kerby is a 67 y.o. male admitted on 12/07/2017 with RLL PNA with associated abscess.  Pharmacy has been consulted for Vancomycin and Zosyn dosing. Patient received Azithromycin and Ceftriaxone in the ED.   WBC is elevated at 27.4. PCT is elevated at 4.77. LA is up to 22. SCr is trending down from 1.71 to 1.13. Cultures are pending.   Plan: Zosyn 3.375g IV every 8 hours - 4 hour infusion.  Vancomycin 1000 mg IV x1, 500 mg IV every 12 hours  Height: 5\' 11"  (180.3 cm) Weight: 121 lb 4.1 oz (55 kg) IBW/kg (Calculated) : 75.3  Temp (24hrs), Avg:97.8 F (36.6 C), Min:97.5 F (36.4 C), Max:98.2 F (36.8 C)  Recent Labs  Lab 12/07/17 0125 12/07/17 0139 12/07/17 0341 12/07/17 0607 12/07/17 0955  WBC 27.4*  --   --   --   --   CREATININE 1.71*  --   --   --  1.13  LATICACIDVEN  --  2.42* 2.99* 1.9 2.2*    Estimated Creatinine Clearance: 49.3 mL/min (by C-G formula based on SCr of 1.13 mg/dL).    No Known Allergies  Antimicrobials this admission: Azith 7/17 x1 CTX 7/17 x1 Vancomycin 7/17 >> Zosyn 7/17 >>  Dose adjustments this admission:   Microbiology results: 7/17 Urine >> 7/17 Sputum >> 7/17 Blood >>  Thank you for allowing pharmacy to be a part of this patient's care.  8/17, PharmD, BCPS, BCCCP Clinical Pharmacist Clinical phone 12/07/2017 until 3:30 [phone] Please refer to [facility] and [facility] for clinical pharmacist numbers. 12/07/2017 2:45 PM

## 2017-12-07 NOTE — ED Notes (Signed)
Patient alert oriented , patient has accidentally spilled urinal in bed linens changed and warm blankets given.

## 2017-12-07 NOTE — ED Notes (Signed)
Casey RN counted 16 RR.

## 2017-12-07 NOTE — Consult Note (Addendum)
[facility]    Date of Admission:  12/07/2017   Total days of antibiotics: 2        Day 1: Vancomycin        Day 1: Zosyn       Reason for Consult: History of HIV    Referring Provider: Blondell Reveal, NP Primary Care Provider: Dr. Fleet Contras, MD  Assessment: Kevin Jimenez is a 67 y.o. male with a PMH of HIV (elite controller) and chronic hepatitis C who is currently admitted for pneumonia. Kevin Jimenez was seeing Dr. Luciana Axe back in 2017 for HIV with Tivicay and Descovy, in addition to treatment of chronic hepatitis C. Patient tolerated treatment for HIV well and it is unclear why he stopped taking his medication; presumably due failure to follow up. His viral loads have been low in the past since he is considered an elite controller. It is unclear if he ever picked up Harvoni for hep C treatment.   We discussed with patient restarting treatment for HIV and possibly switching to a one tablet per day regimen, Biktarvy. He is interested in doing this and understood why it is important to continue HIV treatment, especially when having concomitant infections like pneumonia. Mr. Beachler reports a 60 lb weight loss, however per chart review, he has gained 6 lbs since his last visit in 03/2017. A viral load has been ordered and is pending for further evaluation. His CD4 count of 90 could be secondary to current pneumonia and may not accurately depict his current HIV status. Will talk to pharmacy tomorrow about starting Biktarvy while in patient since Mr. Por does have insurance.   We also discussed starting Hepatitis C treatment once discharged and patient is interested in this as well. Plan to make an appointment for outpatient follow up.   In regards to his current pneumonia with lung abscess and complicated empyema, it may be advisable to pursue drainage. Although Mr. Call is not currently on oxygen, his work of breathing seems to be worsening and could  rapidly decline. Additionally, drainage may allow better identification of the pathology in the lung for more narrow antibiotic focus. Respiratory culture gram stain shows abundant gram positive cocci in pairs with moderate gram negative rods. Final results pending. This may reflect oropharyngeal source, as he has severely poor dentition. Consider consulting IR. Till more cultures results return, continue current antibiotic regimen of Vancomycin and Zosyn.   Plan: 1. Continue Vancomycin 2. Continue Zosyn  Principal Problem:   Sepsis due to pneumonia 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 nicotine  14 mg Transdermal Daily   Continuous Infusions: . sodium chloride 125 mL/hr at 12/07/17 1532  . piperacillin-tazobactam (ZOSYN)  IV 3.375 g (12/07/17 1533)  . vancomycin 1,000 mg (12/07/17 1538)   Followed by  . [START ON 12/08/2017] vancomycin     PRN Meds:.ibuprofen, ondansetron **OR** ondansetron (ZOFRAN) IV  HPI: Kevin Jimenez is a 67 y.o. male with a PMH of HIV (elite controller) and chronic hepatitis C who is currently admitted with sepsis due to pneumonia. Kevin Jimenez says he fell several weeks ago and felt gas form underneath his right  chest. At this time, he went to the ED and was diagnosed with pneumothorax (per chart). He did not feel sick enough  to be admitted and opted to go home. He had been feeling okay since until yesterday, at which time Kevin Jimenez began to experience dizziness, fever, worsening SOB and productive cough. Sputum production is described as yellow without blood. He notes his cough has been present for months and is worsened by exertion, but acutely worsened yesterday. Chest tenderness on there right has been present since initial fall. He denies nausea, vomiting, and abdominal pain.   Kevin Jimenez also endorses a 60  lb weight loss in the past 6 months that has been unintentional. He has been trying to drink Ensure with every meal but says it has not helped his weight loss. He denies currently taking Tivicay and Descovy for HIV but says it has not been very long since he stopped. He tolerated the medication well without side effects.   Additional complaint includes right hip pain. Kevin Jimenez states he "needs a hip replacement" but has not had one yet. He denies back pain.    Review of Systems: Review of Systems  Constitutional: Positive for fever (Resolved. ) and weight loss (60 lb in 6 months; unintentional).  Respiratory: Positive for cough (acute on chronic), sputum production (yellow) and shortness of breath. Negative for hemoptysis.   Cardiovascular: Negative for chest pain.  Gastrointestinal: Negative for abdominal pain, nausea and vomiting.  Musculoskeletal: Positive for joint pain (Right hip pain). Negative for back pain.       Right sided chest tenderness.   Neurological: Positive for dizziness.    Past Medical History:  Diagnosis Date  . 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 HIV (human immunodeficiency virus infection) (HCC)   . Hypertension     Social History   Tobacco Use  . Smoking status: Former Smoker    Packs/day: 0.10    Years: 30.00    Pack years: 3.00    Types: Cigarettes    Last attempt to quit: 03/28/2017    Years since quitting: 0.6  . Smokeless tobacco: Never Used  Substance Use Topics  . Alcohol use: Yes    Alcohol/week: 3.6 oz    Types: 6 Cans of beer per week  . Drug use: No    Comment: no heroin  since 2004     Family History  Problem Relation Age of Onset  . Hypertension Mother   . Rheum arthritis Mother    No Known Allergies  OBJECTIVE: Blood pressure (!) 150/104, pulse (!) 114, temperature (!) 97.5 F (36.4 C), temperature source Oral, resp. rate (!) 34, height 5\' 11"  (1.803 m), weight 55 kg (121 lb 4.1 oz), SpO2 99  %.  Physical Exam  Constitutional: He appears cachectic. He is cooperative. No distress.  HENT:  Mouth/Throat: Mucous membranes are dry. Abnormal dentition (4 small, lower teeth with severe decay. Other lower teeth missing.).  Cardiovascular: Regular rhythm and normal heart sounds. Tachycardia present.  No murmur heard. Pulmonary/Chest: Accessory muscle usage present. No respiratory distress. He has decreased breath sounds in the right middle field and the right lower field.  Abdominal: Normal appearance.  Neurological: He is alert. He is not disoriented.  Skin: Skin is warm and dry.    Lab Results Lab Results  Component Value Date   WBC 27.4 (H) 12/07/2017   HGB 10.4 (L) 12/07/2017   HCT 30.3 (L) 12/07/2017   MCV 95.3 12/07/2017   PLT 541 (H) 12/07/2017    Lab Results  Component Value Date   CREATININE 1.13 12/07/2017  BUN 35 (H) 12/07/2017   NA 128 (L) 12/07/2017   K 4.0 12/07/2017   CL 100 12/07/2017   CO2 18 (L) 12/07/2017    Lab Results  Component Value Date   ALT 25 12/07/2017   AST 55 (H) 12/07/2017   ALKPHOS 42 12/07/2017   BILITOT 1.1 12/07/2017     Microbiology: Recent Results (from the past 240 hour(s))  Culture, respiratory (NON-Expectorated)     Status: None (Preliminary result)   Collection Time: 12/07/17  6:00 AM  Result Value Ref Range Status   Specimen Description SPUTUM  Final   Special Requests NONE  Final   Gram Stain   Final    FEW WBC PRESENT, PREDOMINANTLY PMN ABUNDANT GRAM POSITIVE COCCI IN PAIRS MODERATE GRAM NEGATIVE RODS Performed at [facility], [address], [place], [place] [address]    Culture PENDING  Incomplete   Report Status PENDING  Incomplete    Verdene Lennert, MS4  Dr. Staci Righter, MD [facility] [phone] pager    12/07/2017, 3:58 PM

## 2017-12-07 NOTE — ED Provider Notes (Signed)
[facility] EMERGENCY DEPARTMENT Provider Note   CSN: [id number] Arrival date & time: 12/07/17  0020     History   Chief Complaint Chief Complaint  Patient presents with  . Fatigue    HPI Kevin Jimenez is a 67 y.o. male.  HPI  This is a 67 year old male with history of HIV, hypertension, hepatitis C who presents with generalized weakness and cough.  Patient lives at home with his family.  Patient reports productive cough over the last several days.  Family has noted increasing confusion and decreased oral intake.  Patient reports associated shortness of breath.  No chest pain.  Patient reports compliance with his HIV medications.  He denies any fevers.  Denies any nausea, vomiting, abdominal pain, diarrhea.  Chart review reveals he has not seen infectious disease since 2017.  At that time he had a good CD4 count and a low viral load.  Past Medical History:  Diagnosis Date  . 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 HIV (human immunodeficiency virus infection) (HCC)   . HIV infection (HCC)   . Hypertension     Patient Active Problem List   Diagnosis Date Noted  . Tobacco abuse 01/22/2016  . Screening examination for venereal disease 01/21/2016  . Abnormal drug screen 08/30/2015  . At risk for abuse of opiates 08/30/2015  . Misuse of prescription only drugs (HCC) 08/30/2015  . Long term current use of opiate analgesic 08/04/2015  . Long term prescription opiate use 08/04/2015  . Encounter for therapeutic drug level monitoring 08/04/2015  . Osteoarthritis of hip (Location of Primary Source of Pain) (Right) 08/04/2015  . Liver fibrosis 07/15/2015  . HIV (+) Human immunodeficiency virus disease (HCC) 04/15/2015  . Chronic hepatitis C without hepatic coma (HCC) 04/15/2015  . Chronic hip pain (Location of Primary Source of Pain) (Right) 04/08/2015  . History of substance abuse 03/31/2015  . 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 HERNIA REPAIR          Home Medications    Prior to Admission medications   Medication Sig Start Date End Date Taking? Authorizing Provider  lisinopril-hydrochlorothiazide (PRINZIDE,ZESTORETIC) 10-12.5 MG tablet Take 1 tablet daily by mouth. 02/04/17  Yes [provider]  meloxicam (MOBIC) 15 MG tablet Take 1 tablet (15 mg total) by mouth daily. 08/04/15  Yes Delano Metz, MD  VENTOLIN HFA 108 [phone] Base) MCG/ACT inhaler Take 2 puffs 4 (four) times daily as needed by mouth for shortness of breath. 02/03/17  Yes [provider]  emtricitabine-tenofovir AF (DESCOVY) 200-25 MG tablet Take 1 tablet by mouth daily. Patient not taking: Reported on 12/07/2017 07/16/15   Gardiner Barefoot, MD  gabapentin (NEURONTIN) 100 MG capsule Take 1 capsule (100 mg total) by mouth every 8 (eight) hours. Patient not taking: Reported on 12/07/2017 08/04/15   Delano Metz, MD  Ledipasvir-Sofosbuvir (HARVONI) 90-400 MG TABS Take 1 tablet by mouth daily. Patient not taking: Reported on 12/07/2017 01/21/16   Gardiner Barefoot, MD    Family History Family History  Problem Relation Age of Onset  . Hypertension Mother   . Rheum arthritis Mother     Social History Social History   Tobacco Use  . Smoking status: Former Smoker    Packs/day: 0.10    Years: 30.00    Pack years: 3.00    Types: Cigarettes    Last attempt to  quit: 03/28/2017    Years since quitting: 0.6  . Smokeless tobacco: Never Used  Substance Use Topics  . Alcohol use: Yes    Alcohol/week: 3.6 oz    Types: 6 Cans of beer per week  . Drug use: No    Comment: no heroin  since 2004      Allergies   Patient has no known allergies.   Review of Systems Review of Systems  Constitutional: Positive for unexpected weight change. Negative for fever.  Respiratory: Positive for cough and shortness of breath.   Cardiovascular: Negative for chest pain and  leg swelling.  Gastrointestinal: Negative for abdominal pain, nausea and vomiting.  Musculoskeletal: Negative for back pain.  Neurological: Positive for weakness.  All other systems reviewed and are negative.    Physical Exam Updated Vital Signs BP 121/76   Pulse 100   Temp 98.2 F (36.8 C) (Rectal)   Resp (!) 26   SpO2 96%   Physical Exam  Constitutional: He is oriented to person, place, and time.  Chronically ill-appearing, cachectic  HENT:  Head: Normocephalic and atraumatic.  Mucous membranes dry  Eyes: Pupils are equal, round, and reactive to light.  Neck: Neck supple.  Cardiovascular: Regular rhythm and normal heart sounds.  No murmur heard. Tachycardia  Pulmonary/Chest: Effort normal. No respiratory distress. He has no wheezes. He has rales.  Abdominal: Soft. Bowel sounds are normal. There is no tenderness. There is no rebound.  Musculoskeletal: He exhibits no edema.  Lymphadenopathy:    He has no cervical adenopathy.  Neurological: He is alert and oriented to person, place, and time.  Skin: Skin is warm and dry.  Psychiatric: He has a normal mood and affect.  Nursing note and vitals reviewed.    ED Treatments / Results  Labs (all labs ordered are listed, but only abnormal results are displayed) Labs Reviewed  CBC WITH DIFFERENTIAL/PLATELET - Abnormal; Notable for the following components:      Result Value   WBC 27.4 (*)    RBC 3.18 (*)    Hemoglobin 10.4 (*)    HCT 30.3 (*)    RDW 11.3 (*)    Platelets 541 (*)    Neutro Abs 26.3 (*)    Monocytes Absolute 0.0 (*)    All other components within normal limits  COMPREHENSIVE METABOLIC PANEL - Abnormal; Notable for the following components:   Sodium 122 (*)    Chloride 90 (*)    CO2 19 (*)    Glucose, Bld 122 (*)    BUN 43 (*)    Creatinine, Ser 1.71 (*)    Calcium 8.2 (*)    Total Protein 6.4 (*)    Albumin 2.0 (*)    AST 55 (*)    GFR calc non Af Amer 40 (*)    GFR calc Af Amer 46 (*)    All  other components within normal limits  URINALYSIS, ROUTINE W REFLEX MICROSCOPIC - Abnormal; Notable for the following components:   APPearance HAZY (*)    Hgb urine dipstick SMALL (*)    All other components within normal limits  I-STAT CG4 LACTIC ACID, ED - Abnormal; Notable for the following components:   Lactic Acid, Venous 2.42 (*)    All other components within normal limits  I-STAT CG4 LACTIC ACID, ED - Abnormal; Notable for the following components:   Lactic Acid, Venous 2.99 (*)    All other components within normal limits  CULTURE, BLOOD (ROUTINE X 2)  CULTURE,  BLOOD (ROUTINE X 2)  I-STAT TROPONIN, ED    EKG EKG Interpretation  Date/Time:  Wednesday December 07 2017 01:31:56 EDT Ventricular Rate:  97 PR Interval:    QRS Duration: 84 QT Interval:  366 QTC Calculation: 465 R Axis:   -33 Text Interpretation:  Sinus rhythm Left axis deviation Borderline low voltage, extremity leads Probable anteroseptal infarct, old Confirmed by Ross Marcus ([id number]) on 12/07/2017 2:38:35 AM   Radiology Dg Chest 2 View  Result Date: 12/07/2017 CLINICAL DATA:  67 year old male with shortness of breath and cough. EXAM: CHEST - 2 VIEW COMPARISON:  Chest radiograph dated 04/04/2017 FINDINGS: Large area of consolidative change at the right lung base abutting the lateral pleural surface. Although this may represent pneumonia a mass is not excluded. Clinical correlation and follow-up to resolution recommended. There is a small right pleural effusion. The left lung is clear. No pneumothorax. The cardiac silhouette is within normal limits. No acute osseous pathology. IMPRESSION: Right lower lobe consolidation may represent pneumonia. Clinical correlation and follow-up to resolution recommended to exclude an underlying mass. Trace right pleural effusion. Electronically Signed   By: Elgie Collard M.D.   On: 12/07/2017 02:25   Ct Head Wo Contrast  Result Date: 12/07/2017 CLINICAL DATA:  68 year old male  with shortness of breath and altered mental status. EXAM: CT HEAD WITHOUT CONTRAST TECHNIQUE: Contiguous axial images were obtained from the base of the skull through the vertex without intravenous contrast. COMPARISON:  None. FINDINGS: Brain: The ventricles and sulci appropriate size for patient's age. Mild periventricular and deep white matter chronic microvascular ischemic changes noted. 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 There is no acute intracranial hemorrhage. No mass effect or midline shift. No extra-axial fluid collection. Vascular: No hyperdense vessel or unexpected calcification. Skull: Normal. Negative for fracture or focal lesion. Sinuses/Orbits: Mild diffuse mucoperiosteal thickening of paranasal sinuses. No air-fluid levels. The mastoid air cells are clear. Other: None IMPRESSION: 1. No acute intracranial pathology. 2. Mild chronic microvascular ischemic changes. Electronically Signed   By: Elgie Collard M.D.   On: 12/07/2017 02:23    Procedures Procedures (including critical care time)  CRITICAL CARE Performed by: Shon Baton   Total critical care time: 60 minutes  Critical care time was exclusive of separately billable procedures and treating other patients.  Critical care was necessary to treat or prevent imminent or life-threatening deterioration.  Critical care was time spent personally by me on the following activities: development of treatment plan with patient and/or surrogate as well as nursing, discussions with consultants, evaluation of patient's response to treatment, examination of patient, obtaining history from patient or surrogate, ordering and performing treatments and interventions, ordering and review of laboratory studies, ordering and review of radiographic studies, pulse oximetry and re-evaluation of patient's condition.   Medications Ordered in ED Medications  cefTRIAXone (ROCEPHIN) 2 g in sodium  chloride 0.9 % 100 mL IVPB (0 g Intravenous Stopped 12/07/17 0404)  azithromycin (ZITHROMAX) 500 mg in sodium chloride 0.9 % 250 mL IVPB (500 mg Intravenous New Bag/Given 12/07/17 0407)  sodium chloride 0.9 % bolus 1,000 mL (0 mLs Intravenous Stopped 12/07/17 0235)  sodium chloride 0.9 % 1,000 mL with thiamine 100 mg, folic acid 1 mg, multivitamins adult 10 mL infusion ( Intravenous New Bag/Given 12/07/17 0139)  sodium chloride 0.9 % bolus 1,000 mL (0 mLs Intravenous Stopped 12/07/17 0327)    And  sodium chloride 0.9 % bolus 500 mL (500 mLs Intravenous  New Bag/Given 12/07/17 0333)    And  sodium chloride 0.9 % bolus 250 mL (0 mLs Intravenous Stopped 12/07/17 0345)     Initial Impression / Assessment and Plan / ED Course  I have reviewed the triage vital signs and the nursing notes.  Pertinent labs & imaging results that were available during my care of the patient were reviewed by me and considered in my medical decision making (see chart for details).  Clinical Course as of Dec 08 414  Wed Dec 07, 2017  0237 Informed by nursing the patient has repeated low blood pressure.  Appears fluid responsive.  Lactate 2.4.  Evidence of hyponatremia, leukocytosis, right sided pneumonia.  Patient was given broad-spectrum antibiotics.  Additional fluids up to 30 cc/kg ordered.   [CH]    Clinical Course User Index [CH] Kendle Turbin, Mayer Masker, MD     Patient presents with cough and generalized weakness.  Decreased oral intake.  He is chronically ill-appearing and cachectic on exam.  Initially afebrile.  Initial blood pressure 1 over 9/66; however, this down trended.  He was initially given a fluid bolus.  Lactate 2.3.  However I was informed that he had subsequent multiple low blood pressures.  Sepsis work-up was initiated.  He was given 30 cc/kg of fluid which he appeared to be responsive to.  He has a significant leukocytosis, hyponatremia, acute kidney injury, and a right-sided pneumonia.  Suspect some element  of his blood pressure is related to dehydration versus septic shock.  Suspect severe sepsis and dehydration as a multifactorial cause of his symptoms.  He was given Rocephin and azithromycin for his pneumonia.  He will likely need further evaluation with a CD4 count and a viral load.  Will defer to the admitting hospitalist.  After 3 L of fluid and maintenance banana bag fluids, patient has maintained his blood pressures above 90.  Patient was discussed with admitting hospitalist.   Final Clinical Impressions(s) / ED Diagnoses   Final diagnoses:  Severe sepsis (HCC)  Dehydration  Community acquired pneumonia of right lower lobe of lung (HCC)  Hyponatremia  AKI (acute kidney injury) (HCC)  HIV infection, unspecified symptom status [facility])    ED Discharge Orders    None       Wilkie Aye, Mayer Masker, MD 12/07/17 [phone]

## 2017-12-07 NOTE — H&P (Addendum)
History and Physical    Kevin Jimenez MHD:[id number] DOB: 1950-06-09 DOA: 12/07/2017  PCP: Fleet Contras, MD Consultants:  Infectious Disease Patient coming from: Home - lives with mother   Chief Complaint: weakness and cough  HPI: Kevin Jimenez is a 67 y.o. male with medical history significant of HIV, hypertension, hep C, substance abuse, tobacco abuse, spontaneous pneumothorax presents to ED with weakness, cough and sob. Family has noted increasing confusion and decreased po intake over last 3 days. Pt states he was feeling worse this am prompting ED call to EMS. Pt states he is feeling much better since arrival to ED. Cough productive of yellow sputum, chest pain only when coughing. Denies recent headache dizziness, abdominal pain, hempotysis, n/v/d, melena or hematochezia. States continues to smoke ~2 cigarettes per day, last etoh use "2 weekends ago", denies illegal drug use and states noncompliant with rx'd medications.    ED Course: EMS administered 5mg  albuterol and 125mg  solumedrol enroute to ED. BP on arrival 82/59 now 120's after IVFs, afebrile. Initial lactic acid 2.99 down to 1.9 with fluids. WBC 27.4 (ANC 26.3), Na+ 122, BUN/Cr 42/1.71. Urinalysis unremarkable. Chest xray showing RLL consolidation c/w pna but cannot rule out mass.  CT head with no acute findings. Pt started on empiric Azith/Rocephin,3L fluid bolus to maintain SBP >90, banana bag. [facility] called to admit  Review of Systems: As per HPI; otherwise review of systems reviewed and negative.   Ambulatory Status: Ambulates with cane  Past Medical History:  Diagnosis Date  . 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 HIV (human immunodeficiency virus infection) (HCC)   . HIV infection (HCC)   . Hypertension     Past Surgical History:  Procedure Laterality Date  . HERNIA REPAIR      Social History   Socioeconomic History  . Marital status: Single    Spouse name: Not on  file  . Number of children: Not on file  . Years of education: Not on file  . Highest education level: Not on file  Occupational History  . Not on file  Social Needs  . Financial resource strain: Not on file  . Food insecurity:    Worry: Not on file    Inability: Not on file  . Transportation needs:    Medical: Not on file    Non-medical: Not on file  Tobacco Use  . Smoking status: Former Smoker    Packs/day: 0.10    Years: 30.00    Pack years: 3.00    Types: Cigarettes    Last attempt to quit: 03/28/2017    Years since quitting: 0.6  . Smokeless tobacco: Never Used  Substance and Sexual Activity  . Alcohol use: Yes    Alcohol/week: 3.6 oz    Types: 6 Cans of beer per week  . Drug use: No    Comment: no heroin  since 2004   . Sexual activity: Not Currently    Partners: Female    Birth control/protection: Condom  Lifestyle  . Physical activity:    Days per week: Not on file    Minutes per session: Not on file  . Stress: Not on file  Relationships  . Social connections:    Talks on phone: Not on file    Gets together: Not on file    Attends religious service: Not on file    Active member of club or organization: Not on file    Attends meetings of clubs  or organizations: Not on file    Relationship status: Not on file  . Intimate partner violence:    Fear of current or ex partner: Not on file    Emotionally abused: Not on file    Physically abused: Not on file    Forced sexual activity: Not on file  Other Topics Concern  . Not on file  Social History Narrative  . Not on file    No Known Allergies  Family History  Problem Relation Age of Onset  . Hypertension Mother   . Rheum arthritis Mother     Prior to Admission medications   Medication Sig Start Date End Date Taking? Authorizing Provider  lisinopril-hydrochlorothiazide (PRINZIDE,ZESTORETIC) 10-12.5 MG tablet Take 1 tablet daily by mouth. 02/04/17  Yes [provider]  meloxicam (MOBIC) 15 MG  tablet Take 1 tablet (15 mg total) by mouth daily. 08/04/15  Yes Delano Metz, MD  VENTOLIN HFA 108 [phone] Base) MCG/ACT inhaler Take 2 puffs 4 (four) times daily as needed by mouth for shortness of breath. 02/03/17  Yes [provider]  emtricitabine-tenofovir AF (DESCOVY) 200-25 MG tablet Take 1 tablet by mouth daily. Patient not taking: Reported on 12/07/2017 07/16/15   Gardiner Barefoot, MD  gabapentin (NEURONTIN) 100 MG capsule Take 1 capsule (100 mg total) by mouth every 8 (eight) hours. Patient not taking: Reported on 12/07/2017 08/04/15   Delano Metz, MD  Ledipasvir-Sofosbuvir (HARVONI) 90-400 MG TABS Take 1 tablet by mouth daily. Patient not taking: Reported on 12/07/2017 01/21/16   Gardiner Barefoot, MD    Physical Exam: Vitals:   12/07/17 0615 12/07/17 0630 12/07/17 0645 12/07/17 0700  BP: (!) 106/93 112/81 130/86 124/85  Pulse: (!) 104 (!) 103 (!) 52 (!) 105  Resp: (!) 38 (!) 40 (!) 26 (!) 33  Temp:      TempSrc:      SpO2: 97% 95% 98% 98%     General: chronically ill, cachetic appearing in NAD Eyes: EOMI, normal lids, iris ENT:  Poor dentition, dry mucous membranes, no lesions noted Neck: no LAD, masses or thyromegaly; no carotid bruits Cardiovascular:  S1S2 RRR, no m/r/g. No LE edema.  Respiratory:  Diminished throughout, but CTA bilaterally no wheezes/rales/rhonchi.  decreased respiratory effort. Abdomen: flat, soft, NT, ND, NABS Back:  normal alignment, no CVAT Skin: no rash or induration seen on limited exam Musculoskeletal: grossly normal tone BUE/BLE, good ROM, no bony abnormality Lower extremity: No LE edema.  Limited foot exam with no ulcerations.  2+ distal pulses. Psychiatric: grossly normal mood and affect, speech fluent and appropriate, AOx3 and very lucid on admission evaluaion Neurologic: CN 2-12 grossly intact, moves all extremities in coordinated fashion, sensation intact    Radiological Exams on Admission: Dg Chest 2 View  Result Date:  12/07/2017 CLINICAL DATA:  67 year old male with shortness of breath and cough. EXAM: CHEST - 2 VIEW COMPARISON:  Chest radiograph dated 04/04/2017 FINDINGS: Large area of consolidative change at the right lung base abutting the lateral pleural surface. Although this may represent pneumonia a mass is not excluded. Clinical correlation and follow-up to resolution recommended. There is a small right pleural effusion. The left lung is clear. No pneumothorax. The cardiac silhouette is within normal limits. No acute osseous pathology. IMPRESSION: Right lower lobe consolidation may represent pneumonia. Clinical correlation and follow-up to resolution recommended to exclude an underlying mass. Trace right pleural effusion. Electronically Signed   By: Elgie Collard M.D.   On: 12/07/2017 02:25   Ct  Head Wo Contrast  Result Date: 12/07/2017 CLINICAL DATA:  67 year old male with shortness of breath and altered mental status. EXAM: CT HEAD WITHOUT CONTRAST TECHNIQUE: Contiguous axial images were obtained from the base of the skull through the vertex without intravenous contrast. COMPARISON:  None. FINDINGS: Brain: The ventricles and sulci appropriate size for patient's age. Mild periventricular and deep white matter chronic microvascular ischemic changes noted. 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 There is no acute intracranial hemorrhage. No mass effect or midline shift. No extra-axial fluid collection. Vascular: No hyperdense vessel or unexpected calcification. Skull: Normal. Negative for fracture or focal lesion. Sinuses/Orbits: Mild diffuse mucoperiosteal thickening of paranasal sinuses. No air-fluid levels. The mastoid air cells are clear. Other: None IMPRESSION: 1. No acute intracranial pathology. 2. Mild chronic microvascular ischemic changes. Electronically Signed   By: Elgie Collard M.D.   On: 12/07/2017 02:23     Labs on Admission: I have personally reviewed  the available labs and imaging studies at the time of the admission.  Pertinent labs:  WBC 27.4 (96% neutrophil) Hgb 10.4 Na+ 122 BUN/Cr 43/1.71 (0.76 12/2015) LFTs wnl Lactic acid 2.42>>2.99>>1.9 Trop poc neg x 1   Assessment/Plan Principal Problem:   Sepsis due to pneumonia [facility]) Active Problems:   History of substance abuse   HIV (+) Human immunodeficiency virus disease (HCC)   Tobacco abuse   Hyponatremia   Acute kidney injury (HCC)    Sepsis secondary to CAP -as evidenced by hypotension, leukocytosis (profound, but did receive IV steroids enroute), hypotension and lactic acidosis. Family reports AMS prior to arrival, but seemingly resolved now. -u/a unremarkable -lactic acid improved with 3L fluid bolus 2.4>>1.9 -Admit med/surg. Initiate sepsis protocol.  -panculture. Check procalc -empiric Azith/Rocephin for now.  -continue gentle IVFs for now -question of RLL mass on chest xray. CT done 03/2017 showed RLL nodule recommending 60mo f/u. Will go ahead and order CT chest given presentation and report of weight loss  Hyponatremia -Na+ 122 -suspect r/t dehydration based on exam and hx. States he is compliant with diuretic, but uncertain. Urine studies pending -Continue gentle IVFs for now. Received 3L fluid bolus in ED -BMET in am   Acute kidney injury, mild -Cr 1.71 (0.76 03/2017), BUN 42 -Again suspect r/t dehydration in setting of poor po intake +/- diuretic -gentle IVFs, Avoid renal toxic agents -recheck in am  HIV -not seen by ID since 2017 and noncompliant with meds -will ask ID to see while inpt -check CD4, viral load  Hx substance abuse -check UDS  Tobacco abuse/Alcohol use -pt declined nicotine patch for now -Denies heavy etoh use.  -received banana bag in ED  Hep C -noncompliant with Harvoni -ID to see   DVT prophylaxis:  SCDs Code Status: DNR confirmed with pt on admit Family Communication: none available at time of admission Disposition  Plan: Home once clinically improved Consults called: Infectious disease. Spoke with Dr. Luciana Axe Admission status: inpt  Cyndee Brightly, NP-C [facility]  pgr [phone]   If note is complete, please contact covering daytime or nighttime physician. [email or web address] Password [facility]  12/07/2017, 7:36 AM

## 2017-12-08 DIAGNOSIS — J869 Pyothorax without fistula: Secondary | ICD-10-CM

## 2017-12-08 LAB — CBC
HEMATOCRIT: 28.3 % — AB (ref 39.0–52.0)
Hemoglobin: 9.9 g/dL — ABNORMAL LOW (ref 13.0–17.0)
MCH: 32.5 pg (ref 26.0–34.0)
MCHC: 35 g/dL (ref 30.0–36.0)
MCV: 92.8 fL (ref 78.0–100.0)
Platelets: 509 10*3/uL — ABNORMAL HIGH (ref 150–400)
RBC: 3.05 MIL/uL — ABNORMAL LOW (ref 4.22–5.81)
RDW: 11.4 % — AB (ref 11.5–15.5)
WBC: 32 10*3/uL — ABNORMAL HIGH (ref 4.0–10.5)

## 2017-12-08 LAB — HIV-1 RNA QUANT-NO REFLEX-BLD
HIV 1 RNA QUANT: 240 {copies}/mL
LOG10 HIV-1 RNA: 2.38 {Log_copies}/mL

## 2017-12-08 LAB — URINE CULTURE

## 2017-12-08 MED ORDER — SODIUM CHLORIDE 0.9 % IV SOLN
3.0000 g | Freq: Three times a day (TID) | INTRAVENOUS | Status: DC
  Administered 2017-12-08 – 2017-12-22 (×42): 3 g via INTRAVENOUS
  Filled 2017-12-08 (×44): qty 3

## 2017-12-08 MED ORDER — ENSURE ENLIVE PO LIQD
237.0000 mL | Freq: Three times a day (TID) | ORAL | Status: DC
  Administered 2017-12-08 – 2017-12-12 (×8): 237 mL via ORAL

## 2017-12-08 MED ORDER — ZOLPIDEM TARTRATE 5 MG PO TABS
5.0000 mg | ORAL_TABLET | Freq: Once | ORAL | Status: AC
  Administered 2017-12-08: 5 mg via ORAL
  Filled 2017-12-08: qty 1

## 2017-12-08 MED ORDER — ADULT MULTIVITAMIN W/MINERALS CH
1.0000 | ORAL_TABLET | Freq: Every day | ORAL | Status: DC
  Administered 2017-12-08 – 2017-12-27 (×21): 1 via ORAL
  Filled 2017-12-08 (×21): qty 1

## 2017-12-08 NOTE — Progress Notes (Signed)
    [facility]   Reason for visit: Follow up on HIV, empyema/abscess  Interval History: started on Biktarvy, changed zosyn to amp sulbactam  Physical Exam: Constitutional:  Vitals:   12/08/17 0046 12/08/17 0425  BP: (!) 155/96 (!) 151/95  Pulse: (!) 109 (!) 105  Resp: (!) 42 (!) 48  Temp: 97.9 F (36.6 C) 98 F (36.7 C)  SpO2: 97% 98%   patient appears in NAD HENT: poor dentition Respiratory: Normal respiratory effort; diminished breath sounds Cardiovascular: RRR GI: soft, nt, nd  Review of Systems: Constitutional: negative for anorexia Gastrointestinal: negative for diarrhea  Lab Results  Component Value Date   WBC 32.0 (H) 12/08/2017   HGB 9.9 (L) 12/08/2017   HCT 28.3 (L) 12/08/2017   MCV 92.8 12/08/2017   PLT 509 (H) 12/08/2017    Lab Results  Component Value Date   CREATININE 1.13 12/07/2017   BUN 35 (H) 12/07/2017   NA 128 (L) 12/07/2017   K 4.0 12/07/2017   CL 100 12/07/2017   CO2 18 (L) 12/07/2017    Lab Results  Component Value Date   ALT 25 12/07/2017   AST 55 (H) 12/07/2017   ALKPHOS 42 12/07/2017     Microbiology: Recent Results (from the past 240 hour(s))  Urine culture     Status: Abnormal   Collection Time: 12/07/17  3:00 AM  Result Value Ref Range Status   Specimen Description URINE, RANDOM  Final   Special Requests   Final    NONE Performed at [facility], [address], [place], [place] [address]    Culture MULTIPLE SPECIES PRESENT, SUGGEST RECOLLECTION (A)  Final   Report Status 12/08/2017 FINAL  Final  Culture, respiratory (NON-Expectorated)     Status: None (Preliminary result)   Collection Time: 12/07/17  6:00 AM  Result Value Ref Range Status   Specimen Description SPUTUM  Final   Special Requests NONE  Final   Gram Stain   Final    FEW WBC PRESENT, PREDOMINANTLY PMN ABUNDANT GRAM POSITIVE COCCI IN PAIRS MODERATE GRAM NEGATIVE RODS    Culture   Final    CULTURE REINCUBATED FOR BETTER  GROWTH Performed at [facility], [address] [place], [place] [address]    Report Status PENDING  Incomplete    Impression/Plan:  1. HIV - started therapy and will have close follow up in our clinic 2.  Lung abscess and empyema (large right pleural effusion) - will need IR/CVTS evaluation for drainage.

## 2017-12-08 NOTE — Progress Notes (Signed)
Initial Nutrition Assessment  DOCUMENTATION CODES:   Severe malnutrition in context of chronic illness, Underweight  INTERVENTION:    Ensure Enlive po TID, each supplement provides 350 kcal and 20 grams of protein  Multivitamin daily  NUTRITION DIAGNOSIS:   Severe Malnutrition related to chronic illness(HIV) as evidenced by severe fat depletion, severe muscle depletion.  GOAL:   Patient will meet greater than or equal to 90% of their needs  MONITOR:   PO intake, Supplement acceptance  REASON FOR ASSESSMENT:   Malnutrition Screening Tool    ASSESSMENT:   67 yo male with PMH of substance abuse, tobacco dependence, HTN, HIV (not on antivirals for several months), COPD, and chronic hepatitis C who was admitted on 7/17 with PNA, small left pleural effusion, large loculated right pleural effusion.  Patient reports poor appetite and poor intake "for a while." He just doesn't feel like eating. Usually eats 1-2 meals per day, small portions. He likes Ensure supplements, agrees to drink 3 per day. Weight has been stable, but is underweight with BMI=17.1.  Labs reviewed. Sodium 128 (L), improved from yesterday (122) Medications reviewed.   NUTRITION - FOCUSED PHYSICAL EXAM:    Most Recent Value  Orbital Region  Severe depletion  Upper Arm Region  Severe depletion  Thoracic and Lumbar Region  Severe depletion  Buccal Region  Severe depletion  Temple Region  Severe depletion  Clavicle Bone Region  Severe depletion  Clavicle and Acromion Bone Region  Moderate depletion  Scapular Bone Region  Moderate depletion  Dorsal Hand  Moderate depletion  Patellar Region  Moderate depletion  Anterior Thigh Region  Moderate depletion  Posterior Calf Region  Moderate depletion  Edema (RD Assessment)  None  Hair  Reviewed  Eyes  Reviewed  Mouth  Reviewed  Skin  Reviewed  Nails  Reviewed       Diet Order:   Diet Order           Diet regular Room service appropriate? Yes; Fluid  consistency: Thin  Diet effective now          EDUCATION NEEDS:   No education needs have been identified at this time  Skin:  Skin Assessment: Reviewed RN Assessment  Last BM:  7/17  Height:   Ht Readings from Last 1 Encounters:  12/07/17 5\' 11"  (1.803 m)    Weight:   Wt Readings from Last 1 Encounters:  12/08/17 122 lb 12.7 oz (55.7 kg)    Ideal Body Weight:  78.2 kg  BMI:  Body mass index is 17.13 kg/m.  Estimated Nutritional Needs:   Kcal:  1800-2000  Protein:  80-90 gm  Fluid:  1.8 L    12/10/17, RD, LDN, CNSC Pager [phone] After Hours Pager [phone]

## 2017-12-08 NOTE — Plan of Care (Signed)

## 2017-12-08 NOTE — Progress Notes (Signed)
Patient Demographics:    Kevin Jimenez, is a 67 y.o. male, DOB - 06-21-1950, WSF:[id number]  Admit date - 12/07/2017   Admitting Physician Briscoe Deutscher, MD  Outpatient Primary MD for the patient is Fleet Contras, MD  LOS - 1   Chief Complaint  Patient presents with  . 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 Recent non-compliance, started on Biktarvy   2) sepsis secondary to rt sided PNA with  Lung abscess and empyema (large right pleural effusion) - will need IR/CVTS evaluation for drainage, ID switch patient from IV Zosyn to IV Unasyn, continue vancomycin.  Please note the patient is immunocompromised given his HIV status with poor compliance with HIV regimen recently, on admission patient had leukocytosis, hemodynamic instability including hypotension, lactic acid was elevated initially, white count is up to 32  3)FTT--- per ID physician patient's current weight is consistent with his previous weight in the ID clinic, give Remeron 15 mg qhs  for appetite stimulation  4)HYponatremia--multifactorial, on admission sodium was 122, improved to 128, continue to hydrate and monitor closely  5)AKI----acute kidney injury  -   resolved with hydration, creatinine on admission= 1.71  ,   baseline creatinine =  0.76   , creatinine is now= 1.1      ,  Avoid nephrotoxic agents/dehydration/hypotension  6)Hep C--apparently is noncompliant with Harvoni  Code Status : DNR  Disposition Plan  : home   Consults  :  ID/IR   DVT Prophylaxis  :    Heparin - SCDs (hold off on heparin to allow for drainage of empyema)  Lab Results  Component Value Date   PLT 509 (H) 12/08/2017    Inpatient Medications  Scheduled Meds: . bictegravir-emtricitabine-tenofovir AF  1 tablet Oral Daily  . feeding supplement (ENSURE ENLIVE)  237 mL Oral TID BM  . guaiFENesin  600 mg Oral BID  . multivitamin with minerals  1 tablet Oral Daily  . nicotine  14 mg Transdermal Daily   Continuous Infusions: . sodium chloride 125 mL/hr at 12/08/17 1531  . ampicillin-sulbactam (UNASYN) IV 3 g (12/08/17 1418)  . vancomycin 500 mg (12/08/17 1535)   PRN Meds:.benzonatate, ibuprofen, labetalol, ondansetron **OR** ondansetron (ZOFRAN) IV    Anti-infectives (From admission, onward)   Start     Dose/Rate Route Frequency Ordered Stop   12/08/17 1430  Ampicillin-Sulbactam (UNASYN) 3 g in sodium chloride 0.9 % 100 mL IVPB     3 g 200 mL/hr over 30 Minutes Intravenous Every 8 hours 12/08/17 0915     12/08/17 0630  azithromycin (ZITHROMAX) 500 mg in sodium chloride 0.9 % 250 mL IVPB  Status:  Discontinued     500 mg 250 mL/hr over 60 Minutes Intravenous Every 24 hours 12/07/17 1014 12/07/17 1442   12/08/17  0600  cefTRIAXone (ROCEPHIN) 2 g in sodium chloride 0.9 % 100 mL IVPB  Status:  Discontinued     2 g 200 mL/hr over 30 Minutes Intravenous Every 24 hours 12/07/17 1014 12/07/17 1442   12/08/17 0300  vancomycin (VANCOCIN) 500 mg in sodium chloride 0.9 % 100 mL IVPB     500 mg 100 mL/hr over 60 Minutes Intravenous Every 12 hours 12/07/17 1455     12/07/17 1800  bictegravir-emtricitabine-tenofovir AF (BIKTARVY) 50-200-25 MG per tablet 1 tablet     1 tablet Oral Daily 12/07/17 1651     12/07/17 1500  vancomycin (VANCOCIN) IVPB 1000 mg/200 mL premix     1,000 mg 200 mL/hr over 60 Minutes Intravenous  Once 12/07/17 1455 12/07/17 1638   12/07/17 1500  piperacillin-tazobactam (ZOSYN) IVPB 3.375 g  Status:  Discontinued     3.375 g 12.5 mL/hr over 240 Minutes  Intravenous Every 8 hours 12/07/17 1455 12/08/17 0915   12/07/17 0245  cefTRIAXone (ROCEPHIN) 2 g in sodium chloride 0.9 % 100 mL IVPB  Status:  Discontinued     2 g 200 mL/hr over 30 Minutes Intravenous Every 24 hours 12/07/17 0236 12/07/17 1007   12/07/17 0245  azithromycin (ZITHROMAX) 500 mg in sodium chloride 0.9 % 250 mL IVPB  Status:  Discontinued     500 mg 250 mL/hr over 60 Minutes Intravenous Every 24 hours 12/07/17 0236 12/07/17 1007        Objective:   Vitals:   12/07/17 1921 12/08/17 0046 12/08/17 0425 12/08/17 1445  BP: 129/81 (!) 155/96 (!) 151/95 (!) 157/98  Pulse: (!) 103 (!) 109 (!) 105 (!) 115  Resp: (!) 35 (!) 42 (!) 48 (!) 38  Temp: 98 F (36.7 C) 97.9 F (36.6 C) 98 F (36.7 C)   TempSrc: Oral Oral Oral   SpO2: 99% 97% 98% 97%  Weight:   55.7 kg (122 lb 12.7 oz)   Height:        Wt Readings from Last 3 Encounters:  12/08/17 55.7 kg (122 lb 12.7 oz)  04/04/17 52.2 kg (115 lb)  03/30/17 53.1 kg (117 lb)     Intake/Output Summary (Last 24 hours) at 12/08/2017 1732 Last data filed at 12/08/2017 1103 Gross per 24 hour  Intake 2126.94 ml  Output 1550 ml  Net 576.94 ml     Physical Exam  Gen:- Awake Alert, cachectic HEENT:- [place].AT, No sclera icterus Neck-Supple Neck,No JVD,.  Lungs-diminished on the right with scattered rales CV- S1, S2 normal Abd-  +ve B.Sounds, Abd Soft, No tenderness,    Extremity/Skin:- No  edema,   Good pulses  Psych-affect is appropriate, oriented x3 Neuro-no new focal deficits, no tremors   Data Review:   Micro Results Recent Results (from the past 240 hour(s))  Urine culture     Status: Abnormal   Collection Time: 12/07/17  3:00 AM  Result Value Ref Range Status   Specimen Description URINE, RANDOM  Final   Special Requests   Final    NONE Performed at [facility], [address], [place], [place] [address]    Culture MULTIPLE SPECIES PRESENT, SUGGEST RECOLLECTION (A)  Final   Report Status 12/08/2017 FINAL   Final  Culture, respiratory (NON-Expectorated)     Status: None (Preliminary result)   Collection Time: 12/07/17  6:00 AM  Result Value Ref Range Status   Specimen Description SPUTUM  Final   Special Requests NONE  Final   Gram Stain   Final  FEW WBC PRESENT, PREDOMINANTLY PMN ABUNDANT GRAM POSITIVE COCCI IN PAIRS MODERATE GRAM NEGATIVE RODS Performed at [facility], [address] [place], [place] [address]    Culture ABUNDANT HAEMOPHILUS INFLUENZAE  Final   Report Status PENDING  Incomplete    Radiology Reports Dg Chest 2 View  Result Date: 12/07/2017 CLINICAL DATA:  67 year old male with shortness of breath and cough. EXAM: CHEST - 2 VIEW COMPARISON:  Chest radiograph dated 04/04/2017 FINDINGS: Large area of consolidative change at the right lung base abutting the lateral pleural surface. Although this may represent pneumonia a mass is not excluded. Clinical correlation and follow-up to resolution recommended. There is a small right pleural effusion. The left lung is clear. No pneumothorax. The cardiac silhouette is within normal limits. No acute osseous pathology. IMPRESSION: Right lower lobe consolidation may represent pneumonia. Clinical correlation and follow-up to resolution recommended to exclude an underlying mass. Trace right pleural effusion. Electronically Signed   By: Elgie Collard M.D.   On: 12/07/2017 02:25   Ct Head Wo Contrast  Result Date: 12/07/2017 CLINICAL DATA:  67 year old male with shortness of breath and altered mental status. EXAM: CT HEAD WITHOUT CONTRAST TECHNIQUE: Contiguous axial images were obtained from the base of the skull through the vertex without intravenous contrast. COMPARISON:  None. FINDINGS: Brain: The ventricles and sulci appropriate size for patient's age. Mild periventricular and deep white matter chronic microvascular ischemic changes noted. 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 There is  no acute intracranial hemorrhage. No mass effect or midline shift. No extra-axial fluid collection. Vascular: No hyperdense vessel or unexpected calcification. Skull: Normal. Negative for fracture or focal lesion. Sinuses/Orbits: Mild diffuse mucoperiosteal thickening of paranasal sinuses. No air-fluid levels. The mastoid air cells are clear. Other: None IMPRESSION: 1. No acute intracranial pathology. 2. Mild chronic microvascular ischemic changes. Electronically Signed   By: Elgie Collard M.D.   On: 12/07/2017 02:23   Ct Chest W Contrast  Result Date: 12/07/2017 CLINICAL DATA:  Cough and shortness of breath, HIV. Unintentional weight loss. EXAM: CT CHEST WITH CONTRAST TECHNIQUE: Multidetector CT imaging of the chest was performed during intravenous contrast administration. CONTRAST:  OMNIPAQUE IOHEXOL 300 MG/ML  SOLN COMPARISON:  Chest radiographs 12/07/2017 and CT chest 03/30/2017. FINDINGS: Cardiovascular: Atherosclerotic calcification of the arterial vasculature, including coronary arteries. Heart is at the upper limits normal in size to mildly enlarged. No pericardial effusion. Mediastinum/Nodes: Mediastinal lymph nodes measure up to 9 mm in the low right paratracheal station. Hilar lymph nodes measure up to 1.4 cm on the right. No axillary adenopathy. Esophagus is grossly unremarkable. Lungs/Pleura: Right lower lobe consolidation with areas of decreased attenuation, air and air-fluid levels. Highly loculated large right pleural effusion. Centrilobular and paraseptal emphysema. Subpleural ground-glass in the posterior left upper lobe, likely infectious or inflammatory in etiology. Small left pleural effusion, simple in appearance, with minimal compressive atelectasis in the left lower lobe. Upper Abdomen: Visualized portion of the liver is mildly heterogeneous, which may be due to arterial phase imaging. There are 2 areas of hyper attenuation within the right hepatic lobe, measuring up to 1.6 cm  visualized portions of the adrenal glands, kidneys, spleen, pancreas, stomach and bowel are grossly unremarkable. Upper abdominal lymph nodes are not enlarged by CT size criteria. Musculoskeletal: No worrisome lytic or sclerotic lesions. IMPRESSION: 1. Right lower lobe consolidation is likely due to pneumonia. Internal fluid, air and air-fluid levels indicate associated necrosis/abscess formation. An underlying  mass cannot be definitively excluded. Follow-up to clearing is recommended. 2. Large loculated right pleural effusion is indicative of an empyema. 3. Right hilar adenopathy, likely reactive. This can also be re-evaluated on follow-up imaging. 4. Small left pleural effusion. 5. Hyperattenuating lesions in the right hepatic lobe are difficult to further characterize but may represent perfusion anomalies or flash fill hemangiomas. If further evaluation is desired, MR abdomen without and with contrast is recommended. 6. Aortic atherosclerosis (ICD10-170.0). Coronary artery calcification. 7.  Emphysema (ICD10-J43.9). Electronically Signed   By: Leanna Battles M.D.   On: 12/07/2017 12:57     CBC Recent Labs  Lab 12/07/17 0125 12/08/17 0345  WBC 27.4* 32.0*  HGB 10.4* 9.9*  HCT 30.3* 28.3*  PLT 541* 509*  MCV 95.3 92.8  MCH 32.7 32.5  MCHC 34.3 35.0  RDW 11.3* 11.4*  LYMPHSABS 1.1  --   MONOABS 0.0*  --   EOSABS 0.0  --   BASOSABS 0.0  --     Chemistries  Recent Labs  Lab 12/07/17 0125 12/07/17 0955  NA 122* 128*  K 4.0 4.0  CL 90* 100  CO2 19* 18*  GLUCOSE 122* 131*  BUN 43* 35*  CREATININE 1.71* 1.13  CALCIUM 8.2* 7.6*  AST 55*  --   ALT 25  --   ALKPHOS 42  --   BILITOT 1.1  --    ------------------------------------------------------------------------------------------------------------------ No results for input(s): CHOL, HDL, LDLCALC, TRIG, CHOLHDL, LDLDIRECT in the last 72 hours.  No results found for:  HGBA1C ------------------------------------------------------------------------------------------------------------------ No results for input(s): TSH, T4TOTAL, T3FREE, THYROIDAB in the last 72 hours.  Invalid input(s): FREET3 ------------------------------------------------------------------------------------------------------------------ No results for input(s): VITAMINB12, FOLATE, FERRITIN, TIBC, IRON, RETICCTPCT in the last 72 hours.  Coagulation profile Recent Labs  Lab 12/07/17 1128  INR 1.26    No results for input(s): DDIMER in the last 72 hours.  Cardiac Enzymes No results for input(s): CKMB, TROPONINI, MYOGLOBIN in the last 168 hours.  Invalid input(s): CK ------------------------------------------------------------------------------------------------------------------ No results found for: BNP   Shon Hale M.D on 12/08/2017 at 5:32 PM   Go to [email or web address] - password [email or web address] for contact info  [facility] - Office  [phone]

## 2017-12-09 ENCOUNTER — Inpatient Hospital Stay (HOSPITAL_COMMUNITY): Payer: Medicare Other

## 2017-12-09 DIAGNOSIS — R0603 Acute respiratory distress: Secondary | ICD-10-CM

## 2017-12-09 DIAGNOSIS — E43 Unspecified severe protein-calorie malnutrition: Secondary | ICD-10-CM

## 2017-12-09 DIAGNOSIS — J869 Pyothorax without fistula: Secondary | ICD-10-CM

## 2017-12-09 DIAGNOSIS — J14 Pneumonia due to Hemophilus influenzae: Secondary | ICD-10-CM

## 2017-12-09 DIAGNOSIS — J90 Pleural effusion, not elsewhere classified: Secondary | ICD-10-CM

## 2017-12-09 DIAGNOSIS — Z978 Presence of other specified devices: Secondary | ICD-10-CM

## 2017-12-09 DIAGNOSIS — B20 Human immunodeficiency virus [HIV] disease: Secondary | ICD-10-CM

## 2017-12-09 LAB — BASIC METABOLIC PANEL
ANION GAP: 13 (ref 5–15)
BUN: 26 mg/dL — AB (ref 8–23)
CALCIUM: 8.2 mg/dL — AB (ref 8.9–10.3)
CO2: 21 mmol/L — AB (ref 22–32)
Chloride: 98 mmol/L (ref 98–111)
Creatinine, Ser: 0.85 mg/dL (ref 0.61–1.24)
GFR calc Af Amer: >60 mL/min (ref >60)
GFR calc non Af Amer: >60 mL/min (ref >60)
GLUCOSE: 101 mg/dL — AB (ref 70–99)
POTASSIUM: 3.8 mmol/L (ref 3.5–5.1)
Sodium: 132 mmol/L — ABNORMAL LOW (ref 135–145)

## 2017-12-09 LAB — CULTURE, RESPIRATORY W GRAM STAIN

## 2017-12-09 LAB — CULTURE, RESPIRATORY

## 2017-12-09 MED ORDER — MIDAZOLAM HCL 2 MG/2ML IJ SOLN
INTRAMUSCULAR | Status: AC | PRN
  Administered 2017-12-09 (×2): 1 mg via INTRAVENOUS

## 2017-12-09 MED ORDER — DEXTROSE-NACL 5-0.9 % IV SOLN
INTRAVENOUS | Status: DC
  Administered 2017-12-10 – 2017-12-21 (×16): via INTRAVENOUS

## 2017-12-09 MED ORDER — NALOXONE HCL 0.4 MG/ML IJ SOLN
INTRAMUSCULAR | Status: AC | PRN
  Administered 2017-12-09: 0.4 mg via INTRAVENOUS

## 2017-12-09 MED ORDER — MIDAZOLAM HCL 2 MG/2ML IJ SOLN
INTRAMUSCULAR | Status: AC
  Filled 2017-12-09: qty 2

## 2017-12-09 MED ORDER — MORPHINE SULFATE (PF) 2 MG/ML IV SOLN
2.0000 mg | Freq: Once | INTRAVENOUS | Status: AC
  Administered 2017-12-09: 2 mg via INTRAVENOUS
  Filled 2017-12-09: qty 1

## 2017-12-09 MED ORDER — SODIUM CHLORIDE 0.9% FLUSH
5.0000 mL | Freq: Three times a day (TID) | INTRAVENOUS | Status: DC
  Administered 2017-12-10 – 2017-12-25 (×15): 5 mL

## 2017-12-09 MED ORDER — FLUMAZENIL 0.5 MG/5ML IV SOLN
INTRAVENOUS | Status: AC
  Filled 2017-12-09: qty 5

## 2017-12-09 MED ORDER — DEXTROSE-NACL 5-0.45 % IV SOLN: INTRAVENOUS | Status: DC

## 2017-12-09 MED ORDER — FLUMAZENIL 0.5 MG/5ML IV SOLN
INTRAVENOUS | Status: AC | PRN
  Administered 2017-12-09: 0.2 mg via INTRAVENOUS

## 2017-12-09 MED ORDER — FUROSEMIDE 10 MG/ML IJ SOLN
40.0000 mg | Freq: Once | INTRAMUSCULAR | Status: AC
  Administered 2017-12-09: 40 mg via INTRAVENOUS
  Filled 2017-12-09: qty 4

## 2017-12-09 MED ORDER — FENTANYL CITRATE (PF) 100 MCG/2ML IJ SOLN
INTRAMUSCULAR | Status: AC
  Filled 2017-12-09: qty 2

## 2017-12-09 MED ORDER — NALOXONE HCL 0.4 MG/ML IJ SOLN
INTRAMUSCULAR | Status: AC
  Filled 2017-12-09: qty 1

## 2017-12-09 MED ORDER — THIAMINE HCL 100 MG/ML IJ SOLN
100.0000 mg | Freq: Every day | INTRAMUSCULAR | Status: DC
  Administered 2017-12-10: 100 mg via INTRAVENOUS
  Filled 2017-12-09: qty 2

## 2017-12-09 MED ORDER — FENTANYL CITRATE (PF) 100 MCG/2ML IJ SOLN
INTRAMUSCULAR | Status: AC | PRN
  Administered 2017-12-09 (×2): 50 ug via INTRAVENOUS

## 2017-12-09 NOTE — Progress Notes (Signed)
Patient Demographics:    Kevin Jimenez, is a 67 y.o. male, DOB - [birth year], JOI:[id number]  Admit date - 12/07/2017   Admitting Physician Briscoe Deutscher, MD  Outpatient Primary MD for the patient is Fleet Contras, MD  LOS - 2   Chief Complaint  Patient presents with  . 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 Recent non-compliance,  CD4 count is 90 but this may be falsely low due to acute illness C/n  Biktarvy   2) sepsis secondary to rt sided PNA with  Lung abscess and empyema (large right pleural effusion) -patient is more tachypneic, slightly increased work of breathing noted, no hypoxia , CT surgery consult appreciated for possible CVTS evaluation for drainage, CT surgery recommends that IR place right-sided pigtail chest tube catheter for right-sided empyema/abscess, c/n IV Unasyn and iv  Vancomycin as advised by ID,   Please note the patient is immunocompromised given his HIV status with poor compliance with HIV regimen recently.  Leukocytosis noted,  3)FTT--- per ID physician patient's current weight is consistent with his previous weight in the ID clinic, give Remeron 15 mg qhs  for appetite stimulation  4)HYponatremia--multifactorial, on admission sodium was 122, improved to 128, IV fluids on hold due to increased pulmonary congestion  5)AKI----acute kidney injury  -   resolved with hydration, creatinine on admission= 1.71  ,   baseline creatinine =  0.76   ,  creatinine is now= 1.1      ,  Avoid nephrotoxic agents/dehydration/hypotension  6)Hep C--apparently is noncompliant with Harvoni  Code Status : DNR  Disposition Plan  : home   Consults  :  ID/IR/CT surgery  Procedures- right-sided pigtail chest tube catheter for right-sided empyema/abscess by IR  DVT Prophylaxis  :   Heparin - SCDs (hold off on heparin to allow for drainage of empyema)  Lab Results  Component Value Date   PLT 509 (H) 12/08/2017    Inpatient Medications  Scheduled Meds: . bictegravir-emtricitabine-tenofovir AF  1 tablet Oral Daily  . feeding supplement (ENSURE ENLIVE)  237 mL Oral TID BM  . guaiFENesin  600 mg Oral BID  . multivitamin with minerals  1 tablet Oral Daily  . nicotine  14 mg Transdermal Daily   Continuous Infusions: . ampicillin-sulbactam (UNASYN) IV 3 g (12/09/17 1431)   PRN Meds:.benzonatate, ibuprofen, labetalol, ondansetron **OR** ondansetron (ZOFRAN) IV    Anti-infectives (From admission, onward)   Start     Dose/Rate Route Frequency Ordered Stop   12/08/17 1430  Ampicillin-Sulbactam (UNASYN) 3 g in sodium chloride 0.9 % 100 mL IVPB     3 g 200 mL/hr over 30 Minutes Intravenous Every 8 hours 12/08/17 0915     12/08/17 0630  azithromycin (ZITHROMAX) 500 mg in  sodium chloride 0.9 % 250 mL IVPB  Status:  Discontinued     500 mg 250 mL/hr over 60 Minutes Intravenous Every 24 hours 12/07/17 1014 12/07/17 1442   12/08/17 0600  cefTRIAXone (ROCEPHIN) 2 g in sodium chloride 0.9 % 100 mL IVPB  Status:  Discontinued     2 g 200 mL/hr over 30 Minutes Intravenous Every 24 hours 12/07/17 1014 12/07/17 1442   12/08/17 0300  vancomycin (VANCOCIN) 500 mg in sodium chloride 0.9 % 100 mL IVPB  Status:  Discontinued     500 mg 100 mL/hr over 60 Minutes Intravenous Every 12 hours 12/07/17 1455 12/09/17 1423   12/07/17 1800  bictegravir-emtricitabine-tenofovir AF (BIKTARVY) 50-200-25 MG per tablet 1 tablet     1 tablet Oral Daily 12/07/17 1651      12/07/17 1500  vancomycin (VANCOCIN) IVPB 1000 mg/200 mL premix     1,000 mg 200 mL/hr over 60 Minutes Intravenous  Once 12/07/17 1455 12/07/17 1638   12/07/17 1500  piperacillin-tazobactam (ZOSYN) IVPB 3.375 g  Status:  Discontinued     3.375 g 12.5 mL/hr over 240 Minutes Intravenous Every 8 hours 12/07/17 1455 12/08/17 0915   12/07/17 0245  cefTRIAXone (ROCEPHIN) 2 g in sodium chloride 0.9 % 100 mL IVPB  Status:  Discontinued     2 g 200 mL/hr over 30 Minutes Intravenous Every 24 hours 12/07/17 0236 12/07/17 1007   12/07/17 0245  azithromycin (ZITHROMAX) 500 mg in sodium chloride 0.9 % 250 mL IVPB  Status:  Discontinued     500 mg 250 mL/hr over 60 Minutes Intravenous Every 24 hours 12/07/17 0236 12/07/17 1007        Objective:   Vitals:   12/08/17 0425 12/08/17 1445 12/08/17 2055 12/09/17 0500  BP: (!) 151/95 (!) 157/98 (!) 152/118 (!) 132/103  Pulse: (!) 105 (!) 115 (!) 118 (!) 106  Resp: (!) 48 (!) 38 (!) 27 (!) 35  Temp: 98 F (36.7 C)  98.3 F (36.8 C) 98 F (36.7 C)  TempSrc: Oral  Oral Oral  SpO2: 98% 97% 100% 100%  Weight: 55.7 kg (122 lb 12.7 oz)     Height:        Wt Readings from Last 3 Encounters:  12/08/17 55.7 kg (122 lb 12.7 oz)  04/04/17 52.2 kg (115 lb)  03/30/17 53.1 kg (117 lb)     Intake/Output Summary (Last 24 hours) at 12/09/2017 1441 Last data filed at 12/09/2017 0800 Gross per 24 hour  Intake 2445.07 ml  Output 1650 ml  Net 795.07 ml     Physical Exam  Gen:- Awake Alert, cachectic HEENT:- [place].AT, No sclera icterus Neck-Supple Neck,No JVD,.  Lungs-diminished on the right with scattered rales, tachypnea noted CV- S1, S2 normal, regular Abd-  +ve B.Sounds, Abd Soft, No tenderness,    Extremity/Skin:- No  edema,   Good pulses  Psych-affect is appropriate, oriented x3 Neuro-no new focal deficits, no tremors   Data Review:   Micro Results Recent Results (from the past 240 hour(s))  Urine culture     Status: Abnormal   Collection Time:  12/07/17  3:00 AM  Result Value Ref Range Status   Specimen Description URINE, RANDOM  Final   Special Requests   Final    NONE Performed at [facility], [address] [place], [place] [address]    Culture MULTIPLE SPECIES PRESENT, SUGGEST RECOLLECTION (A)  Final   Report Status 12/08/2017 FINAL  Final  Blood Culture (routine x 2)  Status: None (Preliminary result)   Collection Time: 12/07/17  3:32 AM  Result Value Ref Range Status   Specimen Description BLOOD RIGHT ANTECUBITAL  Final   Special Requests   Final    BOTTLES DRAWN AEROBIC AND ANAEROBIC Blood Culture adequate volume   Culture   Final    NO GROWTH 2 DAYS Performed at [facility], [address], [place], [place] [address]    Report Status PENDING  Incomplete  Blood Culture (routine x 2)     Status: None (Preliminary result)   Collection Time: 12/07/17  3:47 AM  Result Value Ref Range Status   Specimen Description BLOOD RIGHT WRIST  Final   Special Requests   Final    BOTTLES DRAWN AEROBIC AND ANAEROBIC Blood Culture adequate volume   Culture   Final    NO GROWTH 2 DAYS Performed at [facility], [address], [place], [place] [address]    Report Status PENDING  Incomplete  Culture, respiratory (NON-Expectorated)     Status: None   Collection Time: 12/07/17  6:00 AM  Result Value Ref Range Status   Specimen Description SPUTUM  Final   Special Requests NONE  Final   Gram Stain   Final    FEW WBC PRESENT, PREDOMINANTLY PMN ABUNDANT GRAM POSITIVE COCCI IN PAIRS MODERATE GRAM NEGATIVE RODS    Culture   Final    ABUNDANT HAEMOPHILUS INFLUENZAE BETA LACTAMASE NEGATIVE Performed at [facility], [address], [place], [place] [address]    Report Status 12/09/2017 FINAL  Final    Radiology Reports Dg Chest 2 View  Result Date: 12/07/2017 CLINICAL DATA:  67 year old male with shortness of breath and cough. EXAM: CHEST - 2 VIEW COMPARISON:  Chest radiograph dated 04/04/2017  FINDINGS: Large area of consolidative change at the right lung base abutting the lateral pleural surface. Although this may represent pneumonia a mass is not excluded. Clinical correlation and follow-up to resolution recommended. There is a small right pleural effusion. The left lung is clear. No pneumothorax. The cardiac silhouette is within normal limits. No acute osseous pathology. IMPRESSION: Right lower lobe consolidation may represent pneumonia. Clinical correlation and follow-up to resolution recommended to exclude an underlying mass. Trace right pleural effusion. Electronically Signed   By: Elgie Collard M.D.   On: 12/07/2017 02:25   Ct Head Wo Contrast  Result Date: 12/07/2017 CLINICAL DATA:  67 year old male with shortness of breath and altered mental status. EXAM: CT HEAD WITHOUT CONTRAST TECHNIQUE: Contiguous axial images were obtained from the base of the skull through the vertex without intravenous contrast. COMPARISON:  None. FINDINGS: Brain: The ventricles and sulci appropriate size for patient's age. Mild periventricular and deep white matter chronic microvascular ischemic changes noted. 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 There is no acute intracranial hemorrhage. No mass effect or midline shift. No extra-axial fluid collection. Vascular: No hyperdense vessel or unexpected calcification. Skull: Normal. Negative for fracture or focal lesion. Sinuses/Orbits: Mild diffuse mucoperiosteal thickening of paranasal sinuses. No air-fluid levels. The mastoid air cells are clear. Other: None IMPRESSION: 1. No acute intracranial pathology. 2. Mild chronic microvascular ischemic changes. Electronically Signed   By: Elgie Collard M.D.   On: 12/07/2017 02:23   Ct Chest W Contrast  Result Date: 12/07/2017 CLINICAL DATA:  Cough and shortness of breath, HIV. Unintentional weight loss. EXAM: CT CHEST WITH CONTRAST TECHNIQUE: Multidetector CT imaging of the  chest was  performed during intravenous contrast administration. CONTRAST:  OMNIPAQUE IOHEXOL 300 MG/ML  SOLN COMPARISON:  Chest radiographs 12/07/2017 and CT chest 03/30/2017. FINDINGS: Cardiovascular: Atherosclerotic calcification of the arterial vasculature, including coronary arteries. Heart is at the upper limits normal in size to mildly enlarged. No pericardial effusion. Mediastinum/Nodes: Mediastinal lymph nodes measure up to 9 mm in the low right paratracheal station. Hilar lymph nodes measure up to 1.4 cm on the right. No axillary adenopathy. Esophagus is grossly unremarkable. Lungs/Pleura: Right lower lobe consolidation with areas of decreased attenuation, air and air-fluid levels. Highly loculated large right pleural effusion. Centrilobular and paraseptal emphysema. Subpleural ground-glass in the posterior left upper lobe, likely infectious or inflammatory in etiology. Small left pleural effusion, simple in appearance, with minimal compressive atelectasis in the left lower lobe. Upper Abdomen: Visualized portion of the liver is mildly heterogeneous, which may be due to arterial phase imaging. There are 2 areas of hyper attenuation within the right hepatic lobe, measuring up to 1.6 cm visualized portions of the adrenal glands, kidneys, spleen, pancreas, stomach and bowel are grossly unremarkable. Upper abdominal lymph nodes are not enlarged by CT size criteria. Musculoskeletal: No worrisome lytic or sclerotic lesions. IMPRESSION: 1. Right lower lobe consolidation is likely due to pneumonia. Internal fluid, air and air-fluid levels indicate associated necrosis/abscess formation. An underlying mass cannot be definitively excluded. Follow-up to clearing is recommended. 2. Large loculated right pleural effusion is indicative of an empyema. 3. Right hilar adenopathy, likely reactive. This can also be re-evaluated on follow-up imaging. 4. Small left pleural effusion. 5. Hyperattenuating lesions in the right  hepatic lobe are difficult to further characterize but may represent perfusion anomalies or flash fill hemangiomas. If further evaluation is desired, MR abdomen without and with contrast is recommended. 6. Aortic atherosclerosis (ICD10-170.0). Coronary artery calcification. 7.  Emphysema (ICD10-J43.9). Electronically Signed   By: Leanna Battles M.D.   On: 12/07/2017 12:57     CBC Recent Labs  Lab 12/07/17 0125 12/08/17 0345  WBC 27.4* 32.0*  HGB 10.4* 9.9*  HCT 30.3* 28.3*  PLT 541* 509*  MCV 95.3 92.8  MCH 32.7 32.5  MCHC 34.3 35.0  RDW 11.3* 11.4*  LYMPHSABS 1.1  --   MONOABS 0.0*  --   EOSABS 0.0  --   BASOSABS 0.0  --     Chemistries  Recent Labs  Lab 12/07/17 0125 12/07/17 0955 12/09/17 0506  NA 122* 128* 132*  K 4.0 4.0 3.8  CL 90* 100 98  CO2 19* 18* 21*  GLUCOSE 122* 131* 101*  BUN 43* 35* 26*  CREATININE 1.71* 1.13 0.85  CALCIUM 8.2* 7.6* 8.2*  AST 55*  --   --   ALT 25  --   --   ALKPHOS 42  --   --   BILITOT 1.1  --   --    ------------------------------------------------------------------------------------------------------------------ No results for input(s): CHOL, HDL, LDLCALC, TRIG, CHOLHDL, LDLDIRECT in the last 72 hours.  No results found for: HGBA1C ------------------------------------------------------------------------------------------------------------------ No results for input(s): TSH, T4TOTAL, T3FREE, THYROIDAB in the last 72 hours.  Invalid input(s): FREET3 ------------------------------------------------------------------------------------------------------------------ No results for input(s): VITAMINB12, FOLATE, FERRITIN, TIBC, IRON, RETICCTPCT in the last 72 hours.  Coagulation profile Recent Labs  Lab 12/07/17 1128  INR 1.26    No results for input(s): DDIMER in the last 72 hours.  Cardiac Enzymes No results for input(s): CKMB, TROPONINI, MYOGLOBIN in the last 168 hours.  Invalid input(s):  CK ------------------------------------------------------------------------------------------------------------------ No results found for: BNP   Kevin Jimenez  Kevin Jimenez M.D on 12/09/2017 at 2:41 PM   Go to [email or web address] - password [email or web address] for contact info  [facility] - Office  [phone]

## 2017-12-09 NOTE — Progress Notes (Signed)
To IR for chest tube insertion.

## 2017-12-09 NOTE — Procedures (Signed)
Rt parapneumonic effusion / empyema  S/p CT Rt chest tube insertion 14 fr  No comp Stable EBL 0 400cc fld removed Keep to LWS at 20 cm water Full report in pacs

## 2017-12-09 NOTE — Consult Note (Addendum)
[address]       [place] [address]             [phone]        [name] [facility] Medical Record #[id number] Date of Birth: 12/15/1950  Referring: Dr. Mariea Clonts Primary Care: Fleet Contras, MD Primary Cardiologist:No primary care provider on file.  Chief Complaint:    Chief Complaint  Patient presents with  . Fatigue    History of Present Illness:      Kevin Jimenez is a 67 yo AA male with known history of HIV( non compliant), and chronic Hepatitis C.  He has a history of heroin and alcohol abuse, negative toxicology on admission.  He presented to the ED via EMS with complaints of shortness of breath, cough, and altered mental status.  Workup in ED showed CXR with evidence of pneumonia but also the possibility of an underlying mass to be present.  His labs showed an elevated lactate level and leukocytosis, he was afebrile.  It was felt he had pneumonia and would require admission for further care. CT scan was obtained and which shows collection of loculated fluid concerning for empyema, however underlying mass could not be ruled out.  The patient has not been taking HIV medications and ID had been consulted who placed patient on a once daily tablet to help with better compliance.  He has been started on broad spectrum ABX for treatment of pneumonia.  The patient currently remains short of breath.  He states he has been sick for a while.  He does drink alcohol daily but denies elicit drug use.  Current Activity/ Functional Status: Patient was independent with mobility/ambulation, transfers, ADL's, IADL's.   Zubrod Score: At the time of surgery this patient's most appropriate activity status/level should be described as: []     0    Normal activity, no symptoms []     1    Restricted in physical strenuous activity but ambulatory, able to do out light work []     2    Ambulatory and capable of self care, unable to do work activities, up and about                  more than 50%  Of the time                            []     3    Only limited self care, in bed greater than 50% of waking hours []     4    Completely disabled, no self care, confined to bed or chair []     5    Moribund  Past Medical History:  Diagnosis Date  . Arthritis    "mainly right hand" (12/07/2017)  . Chronic hepatitis C (HCC)   . COPD (chronic obstructive pulmonary disease) (HCC)   . 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 HIV (human immunodeficiency virus infection) (HCC)    "dx'd in the late 1990s"  . Hypertension   . Pneumonia 12/07/2017    Past Surgical History:  Procedure Laterality Date  . INGUINAL HERNIA REPAIR Right     Social History   Tobacco Use  Smoking Status Former Smoker  . Packs/day: 0.10  . Years: 47.00  . Pack years: 4.70  . Types: Cigarettes  Smokeless Tobacco Never Used    Social History   Substance and  Sexual Activity  Alcohol Use Yes  . Alcohol/week: 3.6 oz  . Types: 6 Cans of beer per week     No Known Allergies  Current Facility-Administered Medications  Medication Dose Route Frequency Provider Last Rate Last Dose  . Ampicillin-Sulbactam (UNASYN) 3 g in sodium chloride 0.9 % 100 mL IVPB  3 g Intravenous Q8H Gardiner Barefoot, MD 200 mL/hr at 12/09/17 0600    . benzonatate (TESSALON) capsule 100 mg  100 mg Oral TID PRN Jonah Blue, MD   100 mg at 12/08/17 1420  . bictegravir-emtricitabine-tenofovir AF (BIKTARVY) 50-200-25 MG per tablet 1 tablet  1 tablet Oral Daily Blanchard Kelch, NP   1 tablet at 12/09/17 1015  . feeding supplement (ENSURE ENLIVE) (ENSURE ENLIVE) liquid 237 mL  237 mL Oral TID BM Emokpae, Courage, MD   237 mL at 12/09/17 1015  . guaiFENesin (MUCINEX) 12 hr tablet 600 mg  600 mg Oral BID Jonah Blue, MD   600 mg at 12/09/17 1015  . ibuprofen (ADVIL,MOTRIN) tablet 400 mg  400 mg Oral Q6H PRN Webb Silversmith, NP   400 mg at 12/08/17 0053  . labetalol  (NORMODYNE,TRANDATE) injection 5 mg  5 mg Intravenous Q2H PRN Jonah Blue, MD   5 mg at 12/09/17 0339  . morphine 2 MG/ML injection 2 mg  2 mg Intravenous Once Emokpae, Courage, MD      . multivitamin with minerals tablet 1 tablet  1 tablet Oral Daily Emokpae, Courage, MD   1 tablet at 12/09/17 1015  . nicotine (NICODERM CQ - dosed in mg/24 hours) patch 14 mg  14 mg Transdermal Daily Webb Silversmith, NP   14 mg at 12/09/17 1013  . ondansetron (ZOFRAN) tablet 4 mg  4 mg Oral Q6H PRN Webb Silversmith, NP       Or  . ondansetron [facility]) injection 4 mg  4 mg Intravenous Q6H PRN Webb Silversmith, NP      . vancomycin (VANCOCIN) 500 mg in sodium chloride 0.9 % 100 mL IVPB  500 mg Intravenous Q12H Quenton Fetter, RPH   Stopped at 12/09/17 3491    Medications Prior to Admission  Medication Sig Dispense Refill Last Dose  . lisinopril-hydrochlorothiazide (PRINZIDE,ZESTORETIC) 10-12.5 MG tablet Take 1 tablet daily by mouth.  0 12/06/2017 at Unknown time  . meloxicam (MOBIC) 15 MG tablet Take 1 tablet (15 mg total) by mouth daily. 30 tablet 0 12/06/2017 at Unknown time  . VENTOLIN HFA 108 (90 Base) MCG/ACT inhaler Take 2 puffs 4 (four) times daily as needed by mouth for shortness of breath.  0 12/06/2017 at Unknown time  . emtricitabine-tenofovir AF (DESCOVY) 200-25 MG tablet Take 1 tablet by mouth daily. (Patient not taking: Reported on 12/07/2017) 30 tablet 5 Not Taking at Unknown time  . gabapentin (NEURONTIN) 100 MG capsule Take 1 capsule (100 mg total) by mouth every 8 (eight) hours. (Patient not taking: Reported on 12/07/2017) 90 capsule 0 Not Taking at Unknown time  . Ledipasvir-Sofosbuvir (HARVONI) 90-400 MG TABS Take 1 tablet by mouth daily. (Patient not taking: Reported on 12/07/2017) 28 tablet 2 Not Taking at Unknown time    Family History  Problem Relation Age of Onset  . Hypertension Mother   . Rheum arthritis Mother   . Ovarian cancer Daughter        Deceased at age  39   Review of Systems:   ROS    Cardiac Review of Systems: Y or  [    ]=  no  Chest Pain [    ]  Resting SOB [ Y  ] Exertional SOB  [  ]  Orthopnea [  ]   Pedal Edema [ N  ]    Palpitations [  ] Syncope  [  ]   Presyncope [   ]  General Review of Systems: [Y] = yes [  ]=no Constitional: recent weight change [ Y ]; anorexia [ Y ]; fatigue [ Y ]; nausea [  ]; night sweats [Y  ]; fever [  ]; or chills [  ]                                                               Dental: Last Dentist visit:   Eye : blurred vision [  ]; diplopia [   ]; vision changes [  ];  Amaurosis fugax[  ]; Resp: cough [Y  ];  wheezing[  ];  hemoptysis[  ]; shortness of breath[Y  ]; paroxysmal nocturnal dyspnea[  ]; dyspnea on exertion[  ]; or orthopnea[  ];  GI:  gallstones[  ], vomiting[  ];  dysphagia[  ]; melena[  ];  hematochezia [  ]; heartburn[  ];   Hx of  Colonoscopy[  ]; GU: kidney stones [  ]; hematuria[  ];   dysuria [  ];  nocturia[  ];  history of     obstruction [  ]; urinary frequency [  ]             Skin: rash, swelling[  ];, hair loss[  ];  peripheral edema[  ];  or itching[  ]; Musculosketetal: myalgias[  ];  joint swelling[  ];  joint erythema[  ];  joint pain[  ];  back pain[  ];  Heme/Lymph: bruising[  ];  bleeding[  ];  anemia[  ];  Neuro: TIA[  ];  headaches[  ];  stroke[  ];  vertigo[  ];  seizures[  ];   paresthesias[  ];  difficulty walking[  ];  Psych:depression[  ]; anxiety[  ];  Endocrine: diabetes[  ];  thyroid dysfunction[  ];  Physical Exam: BP (!) 132/103 (BP Location: Left Arm)   Pulse (!) 106   Temp 98 F (36.7 C) (Oral)   Resp (!) 35   Ht 5\' 11"  (1.803 m)   Wt 122 lb 12.7 oz (55.7 kg)   SpO2 100%   BMI 17.13 kg/m    General appearance: cooperative, icteric and appears malnourished Head: Normocephalic, without obvious abnormality, atraumatic ENT: poor dentition Resp: rhonchi bilaterally and visibly tachypneic Cardio: regular rate and rhythm and tachy GI: soft,  non-tender; bowel sounds normal; no masses,  no organomegaly Extremities: extremities normal, atraumatic, no cyanosis or edema Neurologic: Grossly normal  Diagnostic Studies & Laboratory data:    1. Right lower lobe consolidation is likely due to pneumonia. Internal fluid, air and air-fluid levels indicate associated necrosis/abscess formation. An underlying mass cannot be definitively excluded. Follow-up to clearing is recommended. 2. Large loculated right pleural effusion is indicative of an empyema. 3. Right hilar adenopathy, likely reactive. This can also be re-evaluated on follow-up imaging. 4. Small left pleural effusion. 5. Hyperattenuating lesions in the right hepatic lobe are difficult to further characterize but may represent perfusion anomalies  or flash fill hemangiomas. If further evaluation is desired, MR abdomen without and with contrast is recommended. 6. Aortic atherosclerosis (ICD10-170.0). Coronary artery calcification. 7.  Emphysema (ICD10-J43.9).   I have independently reviewed the above radiologic studies and discussed with the patient   Recent Lab Findings: Lab Results  Component Value Date   WBC 32.0 (H) 12/08/2017   HGB 9.9 (L) 12/08/2017   HCT 28.3 (L) 12/08/2017   PLT 509 (H) 12/08/2017   GLUCOSE 101 (H) 12/09/2017   CHOL 129 03/27/2015   TRIG 81 03/27/2015   HDL 77 03/27/2015   LDLCALC 36 03/27/2015   ALT 25 12/07/2017   AST 55 (H) 12/07/2017   NA 132 (L) 12/09/2017   K 3.8 12/09/2017   CL 98 12/09/2017   CREATININE 0.85 12/09/2017   BUN 26 (H) 12/09/2017   CO2 21 (L) 12/09/2017   INR 1.26 12/07/2017   Assessment / Plan:      1. Empyema- +leukocytosis, on broad spectrum ABX 2. HIV- patient has been noncompliant, ID consulted and has placed patient on new medication regimen 3. Hepatitis C, chronic- patient has not been treated 4. Dispo- recommend placement of pigtail catheter by IR... Patient is DNR and does not wish to be placed on a  ventilator which would be required for surgery.  He also does not wish to undergo any major surgical procedure and wished to have a chest tube placed.  I have spoken with Dr. Donata Clay who is aware of patient and his wishes.  I  spent 55 minutes counseling the patient face to face.   Denny Peon Barrett PA-C  12/09/2017 2:11 PM  Patient examined and CT chest reviewed Patient has a severe RLL pneumonia and reactivated HIV and active hep C He is very weak , cachectic and not a candidate for VATS. Rec IR drain , antibiotics and palliative care to discuss the patients preference for goals of care  Kerin Perna MD

## 2017-12-09 NOTE — Consult Note (Signed)
Chief Complaint: Patient was seen in consultation today for right empyema chest tube drain placement Chief Complaint  Patient presents with  . Fatigue   at the request of Dr Jayme Cloud   Supervising Physician: Ruel Favors  Patient Status: [facility] - In-pt  History of Present Illness: Kevin Jimenez is a 67 y.o. male   Hx HIV Non compliance Rt PNA; sepsis; lung abscess SOB; cough Weak; thin;frail  CTSurgery consulted for intervention--Rec: IR drainage Dr Miles Costain has reviewed imaging and approves procedure  CT 7/17:  IMPRESSION: 1. Right lower lobe consolidation is likely due to pneumonia. Internal fluid, air and air-fluid levels indicate associated necrosis/abscess formation. An underlying mass cannot be definitively excluded. Follow-up to clearing is recommended. 2. Large loculated right pleural effusion is indicative of an empyema. 3. Right hilar adenopathy, likely reactive. This can also be re-evaluated on follow-up imaging. 4. Small left pleural effusion. 5. Hyperattenuating lesions in the right hepatic lobe are difficult to further characterize but may represent perfusion anomalies or flash fill hemangiomas. If further evaluation is desired, MR abdomen without and with contrast is recommended.    Past Medical History:  Diagnosis Date  . Arthritis    "mainly right hand" (12/07/2017)  . Chronic hepatitis C (HCC)   . COPD (chronic obstructive pulmonary disease) (HCC)   . 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 HIV (human immunodeficiency virus infection) (HCC)    "dx'd in the late 1990s"  . Hypertension   . Pneumonia 12/07/2017    Past Surgical History:  Procedure Laterality Date  . INGUINAL HERNIA REPAIR Right     Allergies: Patient has no known allergies.  Medications: Prior to Admission medications   Medication Sig Start Date End Date Taking? Authorizing Provider  lisinopril-hydrochlorothiazide (PRINZIDE,ZESTORETIC)  10-12.5 MG tablet Take 1 tablet daily by mouth. 02/04/17  Yes [provider]  meloxicam (MOBIC) 15 MG tablet Take 1 tablet (15 mg total) by mouth daily. 08/04/15  Yes Delano Metz, MD  VENTOLIN HFA 108 [phone] Base) MCG/ACT inhaler Take 2 puffs 4 (four) times daily as needed by mouth for shortness of breath. 02/03/17  Yes [provider]  emtricitabine-tenofovir AF (DESCOVY) 200-25 MG tablet Take 1 tablet by mouth daily. Patient not taking: Reported on 12/07/2017 07/16/15   Gardiner Barefoot, MD  gabapentin (NEURONTIN) 100 MG capsule Take 1 capsule (100 mg total) by mouth every 8 (eight) hours. Patient not taking: Reported on 12/07/2017 08/04/15   Delano Metz, MD  Ledipasvir-Sofosbuvir (HARVONI) 90-400 MG TABS Take 1 tablet by mouth daily. Patient not taking: Reported on 12/07/2017 01/21/16   Gardiner Barefoot, MD     Family History  Problem Relation Age of Onset  . Hypertension Mother   . Rheum arthritis Mother   . Ovarian cancer Daughter        Deceased at age 29    Social History   Socioeconomic History  . Marital status: Single    Spouse name: Not on file  . Number of children: Not on file  . Years of education: Not on file  . Highest education level: Not on file  Occupational History  . Not on file  Social Needs  . Financial resource strain: Not on file  . Food insecurity:    Worry: Not on file    Inability: Not on file  . Transportation needs:    Medical: Not on file    Non-medical: Not on file  Tobacco Use  .  Smoking status: Former Smoker    Packs/day: 0.10    Years: 47.00    Pack years: 4.70    Types: Cigarettes  . Smokeless tobacco: Never Used  Substance and Sexual Activity  . Alcohol use: Yes    Alcohol/week: 3.6 oz    Types: 6 Cans of beer per week  . Drug use: Not Currently    Types: Heroin, "Crack" cocaine, Marijuana    Comment: 12/07/2017 "haven't used any drugs in the 2000s"  . Sexual activity: Not Currently    Partners: Female     Birth control/protection: Condom  Lifestyle  . Physical activity:    Days per week: Not on file    Minutes per session: Not on file  . Stress: Not on file  Relationships  . Social connections:    Talks on phone: Not on file    Gets together: Not on file    Attends religious service: Not on file    Active member of club or organization: Not on file    Attends meetings of clubs or organizations: Not on file    Relationship status: Not on file  Other Topics Concern  . Not on file  Social History Narrative  . Not on file    Review of Systems: A 12 point ROS discussed and pertinent positives are indicated in the HPI above.  All other systems are negative.  Review of Systems  Constitutional: Positive for activity change, appetite change, fatigue, fever and unexpected weight change.  Respiratory: Positive for cough and shortness of breath.   Cardiovascular: Negative for chest pain.  Gastrointestinal: Negative for abdominal pain.  Neurological: Positive for weakness.  Psychiatric/Behavioral: Negative for behavioral problems and confusion.    Vital Signs: BP (!) 132/103 (BP Location: Left Arm)   Pulse (!) 106   Temp 98 F (36.7 C) (Oral)   Resp (!) 35   Ht 5\' 11"  (1.803 m)   Wt 122 lb 12.7 oz (55.7 kg)   SpO2 100%   BMI 17.13 kg/m   Physical Exam  Constitutional: He is oriented to person, place, and time.  Frail and ill appearing  Cardiovascular: Normal rate, regular rhythm and normal heart sounds.  Pulmonary/Chest: He is in respiratory distress. He has wheezes. He has rales.  Abdominal: Soft.  Musculoskeletal: Normal range of motion.  Neurological: He is alert and oriented to person, place, and time.  Skin: Skin is warm and dry.  Psychiatric: He has a normal mood and affect. His behavior is normal. Judgment and thought content normal.  Pt consents for self-- But so weak unable to sign consent Consented mother via phone  Vitals reviewed.   Imaging: Dg Chest 2  View  Result Date: 12/07/2017 CLINICAL DATA:  67 year old male with shortness of breath and cough. EXAM: CHEST - 2 VIEW COMPARISON:  Chest radiograph dated 04/04/2017 FINDINGS: Large area of consolidative change at the right lung base abutting the lateral pleural surface. Although this may represent pneumonia a mass is not excluded. Clinical correlation and follow-up to resolution recommended. There is a small right pleural effusion. The left lung is clear. No pneumothorax. The cardiac silhouette is within normal limits. No acute osseous pathology. IMPRESSION: Right lower lobe consolidation may represent pneumonia. Clinical correlation and follow-up to resolution recommended to exclude an underlying mass. Trace right pleural effusion. Electronically Signed   By: Elgie Collard M.D.   On: 12/07/2017 02:25   Ct Head Wo Contrast  Result Date: 12/07/2017 CLINICAL DATA:  67 year old male with  shortness of breath and altered mental status. EXAM: CT HEAD WITHOUT CONTRAST TECHNIQUE: Contiguous axial images were obtained from the base of the skull through the vertex without intravenous contrast. COMPARISON:  None. FINDINGS: Brain: The ventricles and sulci appropriate size for patient's age. Mild periventricular and deep white matter chronic microvascular ischemic changes noted. 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 There is no acute intracranial hemorrhage. No mass effect or midline shift. No extra-axial fluid collection. Vascular: No hyperdense vessel or unexpected calcification. Skull: Normal. Negative for fracture or focal lesion. Sinuses/Orbits: Mild diffuse mucoperiosteal thickening of paranasal sinuses. No air-fluid levels. The mastoid air cells are clear. Other: None IMPRESSION: 1. No acute intracranial pathology. 2. Mild chronic microvascular ischemic changes. Electronically Signed   By: Elgie Collard M.D.   On: 12/07/2017 02:23   Ct Chest W Contrast  Result  Date: 12/07/2017 CLINICAL DATA:  Cough and shortness of breath, HIV. Unintentional weight loss. EXAM: CT CHEST WITH CONTRAST TECHNIQUE: Multidetector CT imaging of the chest was performed during intravenous contrast administration. CONTRAST:  OMNIPAQUE IOHEXOL 300 MG/ML  SOLN COMPARISON:  Chest radiographs 12/07/2017 and CT chest 03/30/2017. FINDINGS: Cardiovascular: Atherosclerotic calcification of the arterial vasculature, including coronary arteries. Heart is at the upper limits normal in size to mildly enlarged. No pericardial effusion. Mediastinum/Nodes: Mediastinal lymph nodes measure up to 9 mm in the low right paratracheal station. Hilar lymph nodes measure up to 1.4 cm on the right. No axillary adenopathy. Esophagus is grossly unremarkable. Lungs/Pleura: Right lower lobe consolidation with areas of decreased attenuation, air and air-fluid levels. Highly loculated large right pleural effusion. Centrilobular and paraseptal emphysema. Subpleural ground-glass in the posterior left upper lobe, likely infectious or inflammatory in etiology. Small left pleural effusion, simple in appearance, with minimal compressive atelectasis in the left lower lobe. Upper Abdomen: Visualized portion of the liver is mildly heterogeneous, which may be due to arterial phase imaging. There are 2 areas of hyper attenuation within the right hepatic lobe, measuring up to 1.6 cm visualized portions of the adrenal glands, kidneys, spleen, pancreas, stomach and bowel are grossly unremarkable. Upper abdominal lymph nodes are not enlarged by CT size criteria. Musculoskeletal: No worrisome lytic or sclerotic lesions. IMPRESSION: 1. Right lower lobe consolidation is likely due to pneumonia. Internal fluid, air and air-fluid levels indicate associated necrosis/abscess formation. An underlying mass cannot be definitively excluded. Follow-up to clearing is recommended. 2. Large loculated right pleural effusion is indicative of an empyema.  3. Right hilar adenopathy, likely reactive. This can also be re-evaluated on follow-up imaging. 4. Small left pleural effusion. 5. Hyperattenuating lesions in the right hepatic lobe are difficult to further characterize but may represent perfusion anomalies or flash fill hemangiomas. If further evaluation is desired, MR abdomen without and with contrast is recommended. 6. Aortic atherosclerosis (ICD10-170.0). Coronary artery calcification. 7.  Emphysema (ICD10-J43.9). Electronically Signed   By: Leanna Battles M.D.   On: 12/07/2017 12:57    Labs:  CBC: Recent Labs    12/07/17 0125 12/08/17 0345  WBC 27.4* 32.0*  HGB 10.4* 9.9*  HCT 30.3* 28.3*  PLT 541* 509*    COAGS: Recent Labs    12/07/17 1128  INR 1.26  APTT 39*    BMP: Recent Labs    12/07/17 0125 12/07/17 0955 12/09/17 0506  NA 122* 128* 132*  K 4.0 4.0 3.8  CL 90* 100 98  CO2 19* 18* 21*  GLUCOSE 122* 131* 101*  BUN 43*  35* 26*  CALCIUM 8.2* 7.6* 8.2*  CREATININE 1.71* 1.13 0.85  GFRNONAA 40* >60 >60  GFRAA 46* >60 >60    LIVER FUNCTION TESTS: Recent Labs    12/07/17 0125  BILITOT 1.1  AST 55*  ALT 25  ALKPHOS 42  PROT 6.4*  ALBUMIN 2.0*    TUMOR MARKERS: No results for input(s): AFPTM, CEA, CA199, CHROMGRNA in the last 8760 hours.  Assessment and Plan:  Sepsis Rt PNA; lung abscess Empyema Scheduled for right empyema chest tube drain placement Risks and benefits discussed with the patient including bleeding, infection, damage to adjacent structures, and sepsis.  All of the patient's questions were answered, patient is agreeable to proceed. Consent signed and in chart.   Thank you for this interesting consult.  I greatly enjoyed meeting Kevin Jimenez and look forward to participating in their care.  A copy of this report was sent to the requesting provider on this date.  Electronically Signed: Robet Leu, PA-C 12/09/2017, 3:11 PM   I spent a total of 40 Minutes    in face to face  in clinical consultation, greater than 50% of which was counseling/coordinating care for rt empyema drain placement

## 2017-12-09 NOTE — Sedation Documentation (Signed)
Pt awake and talking, asking questions appropriately. VS stable

## 2017-12-09 NOTE — Progress Notes (Signed)
Visited with Kevin Jimenez.  He shares with me that he has a procedure later today to remove fluid from his lungs.  He states that he doesn't feel well and that he is tired.  When I asked him what he his tiredness is related to, he tells me that he hasn't really slept in 24 hours.  We talked about his family, his mother who is coming to visit later today.  He is looking forward to feeling better.  We had prayer together and I left him to rest.  Thankful for the nurses and medical professionals that are caring for him.    12/09/17 1209  Clinical Encounter Type  Visited With Patient  Visit Type Initial;Spiritual support;Pre-op  Spiritual Encounters  Spiritual Needs Prayer

## 2017-12-09 NOTE — Progress Notes (Signed)
[facility]  Date of Admission:  12/07/2017   Total days of antibiotics: 4        Day 2: Unasyn        Day 3: Biktarvy ASSESSMENT: Kevin Jimenez is a 67 y.o. male with a PMH of HIV (elite controller) and chronic hepatitis C who is currently admitted for pneumonia with lung abscess and complicated empyema.  Kevin Jimenez's breathing quality has worsened since we initially saw him two days ago. It would be important to drain the empyema for alleviation of symptoms, additionally to improving the efficacy of the antibiotics. Respiratory cultures grew H. Influenza that is beta lactamase negative. This could be normal oropharyngeal flora, or the culprit of his pneumonia. Current antibiotics do cover this, but we will have a better idea of infectious burden with pleural fluid culture. For now, we will continue with current medication regimen.   RNA quant came back as 240. This supports that patient is still elite controller. Low CD4 count likely due to current pneumonia and not reflective of his HIV status. Continue treatment with Biktarvy. No need for PCP prophylaxis at this time.   PLAN: 1. Continue Unasyn and Biktarvy 2. Follow up once drain has been placed.   Principal Problem:   Sepsis due to pneumonia [facility]) Active Problems:   History of substance abuse   HIV (+) Human immunodeficiency virus disease (HCC)   Tobacco abuse   Hyponatremia   Acute kidney injury (HCC)   Protein-calorie malnutrition, severe   Scheduled Meds: . bictegravir-emtricitabine-tenofovir AF  1 tablet Oral Daily  . feeding supplement (ENSURE ENLIVE)  237 mL Oral TID BM  . guaiFENesin  600 mg Oral BID  . multivitamin with minerals  1 tablet Oral Daily  . nicotine  14 mg Transdermal Daily   Continuous Infusions: . ampicillin-sulbactam (UNASYN) IV 3 g (12/09/17 1431)   PRN Meds:.benzonatate, ibuprofen, labetalol, ondansetron **OR** ondansetron (ZOFRAN) IV   SUBJECTIVE: [name] states he is doing okay. He continues to have SOB that has worsened.   Review of Systems: Review of Systems  Constitutional: Negative for chills and fever.  Respiratory: Positive for cough and shortness of breath.     No Known Allergies  OBJECTIVE: Vitals:   12/08/17 0425 12/08/17 1445 12/08/17 2055 12/09/17 0500  BP: (!) 151/95 (!) 157/98 (!) 152/118 (!) 132/103  Pulse: (!) 105 (!) 115 (!) 118 (!) 106  Resp: (!) 48 (!) 38 (!) 27 (!) 35  Temp: 98 F (36.7 C)  98.3 F (36.8 C) 98 F (36.7 C)  TempSrc: Oral  Oral Oral  SpO2: 98% 97% 100% 100%  Weight: 55.7 kg (122 lb 12.7 oz)     Height:       Body mass index is 17.13 kg/m.  Physical Exam  Constitutional: He appears cachectic. He appears ill. Nasal cannula in place.  Cardiovascular: Normal rate.  No murmur heard. Pulmonary/Chest: Accessory muscle usage present. Tachypnea noted. He is in respiratory distress. He has wheezes (throughout).  Abdominal: Normal appearance.  Neurological: He is alert.  Skin: Skin is warm and dry.    Lab Results Lab Results  Component Value Date   WBC 32.0 (H) 12/08/2017   HGB 9.9 (L) 12/08/2017   HCT 28.3 (L) 12/08/2017   MCV 92.8 12/08/2017   PLT 509 (H) 12/08/2017    Lab Results  Component Value Date   CREATININE 0.85 12/09/2017   BUN 26 (H) 12/09/2017  NA 132 (L) 12/09/2017   K 3.8 12/09/2017   CL 98 12/09/2017   CO2 21 (L) 12/09/2017    Lab Results  Component Value Date   ALT 25 12/07/2017   AST 55 (H) 12/07/2017   ALKPHOS 42 12/07/2017   BILITOT 1.1 12/07/2017     Microbiology: Recent Results (from the past 240 hour(s))  Urine culture     Status: Abnormal   Collection Time: 12/07/17  3:00 AM  Result Value Ref Range Status   Specimen Description URINE, RANDOM  Final   Special Requests   Final    NONE Performed at [facility], [address], [place], [place] [address]    Culture MULTIPLE SPECIES PRESENT, SUGGEST RECOLLECTION (A)  Final   Report  Status 12/08/2017 FINAL  Final  Blood Culture (routine x 2)     Status: None (Preliminary result)   Collection Time: 12/07/17  3:32 AM  Result Value Ref Range Status   Specimen Description BLOOD RIGHT ANTECUBITAL  Final   Special Requests   Final    BOTTLES DRAWN AEROBIC AND ANAEROBIC Blood Culture adequate volume   Culture   Final    NO GROWTH 2 DAYS Performed at [facility], [address] [place], [place] [address]    Report Status PENDING  Incomplete  Blood Culture (routine x 2)     Status: None (Preliminary result)   Collection Time: 12/07/17  3:47 AM  Result Value Ref Range Status   Specimen Description BLOOD RIGHT WRIST  Final   Special Requests   Final    BOTTLES DRAWN AEROBIC AND ANAEROBIC Blood Culture adequate volume   Culture   Final    NO GROWTH 2 DAYS Performed at [facility], [address] [place], [place] [address]    Report Status PENDING  Incomplete  Culture, respiratory (NON-Expectorated)     Status: None   Collection Time: 12/07/17  6:00 AM  Result Value Ref Range Status   Specimen Description SPUTUM  Final   Special Requests NONE  Final   Gram Stain   Final    FEW WBC PRESENT, PREDOMINANTLY PMN ABUNDANT GRAM POSITIVE COCCI IN PAIRS MODERATE GRAM NEGATIVE RODS    Culture   Final    ABUNDANT HAEMOPHILUS INFLUENZAE BETA LACTAMASE NEGATIVE Performed at [facility], [address] [place], [place] [address]    Report Status 12/09/2017 FINAL  Final    Verdene Lennert, MS4 Dr. Staci Righter, MD  [facility] [phone] pager 12/09/2017, 3:05 PM

## 2017-12-09 NOTE — Progress Notes (Signed)
  Spoke with Dr. Mariea Clonts regarding thoracentesis - will not perform thoracentesis on highly loculated empyema at this time, will await CT surgery to see patient. IR available for reconsult for possible pigtail catheter placement if needed.

## 2017-12-09 NOTE — Sedation Documentation (Signed)
Pt will be reversed with Narcan and Romazicon

## 2017-12-10 ENCOUNTER — Inpatient Hospital Stay (HOSPITAL_COMMUNITY): Payer: Medicare Other

## 2017-12-10 DIAGNOSIS — I361 Nonrheumatic tricuspid (valve) insufficiency: Secondary | ICD-10-CM

## 2017-12-10 LAB — ECHOCARDIOGRAM COMPLETE
Height: 71 in
Weight: 2112.89 oz

## 2017-12-10 LAB — COMPREHENSIVE METABOLIC PANEL
ALT: 538 U/L — AB (ref 0–44)
AST: 2053 U/L — ABNORMAL HIGH (ref 15–41)
Albumin: 1.7 g/dL — ABNORMAL LOW (ref 3.5–5.0)
Alkaline Phosphatase: 60 U/L (ref 38–126)
Anion gap: 15 (ref 5–15)
BILIRUBIN TOTAL: 1 mg/dL (ref 0.3–1.2)
BUN: 52 mg/dL — ABNORMAL HIGH (ref 8–23)
CHLORIDE: 100 mmol/L (ref 98–111)
CO2: 21 mmol/L — ABNORMAL LOW (ref 22–32)
CREATININE: 1.37 mg/dL — AB (ref 0.61–1.24)
Calcium: 8.3 mg/dL — ABNORMAL LOW (ref 8.9–10.3)
GFR, EST NON AFRICAN AMERICAN: 52 mL/min — AB (ref >60)
Glucose, Bld: 74 mg/dL (ref 70–99)
Potassium: 4.5 mmol/L (ref 3.5–5.1)
Sodium: 136 mmol/L (ref 135–145)
TOTAL PROTEIN: 6.3 g/dL — AB (ref 6.5–8.1)

## 2017-12-10 LAB — CBC
HCT: 33.2 % — ABNORMAL LOW (ref 39.0–52.0)
HEMOGLOBIN: 11.2 g/dL — AB (ref 13.0–17.0)
MCH: 32.2 pg (ref 26.0–34.0)
MCHC: 33.7 g/dL (ref 30.0–36.0)
MCV: 95.4 fL (ref 78.0–100.0)
PLATELETS: 592 10*3/uL — AB (ref 150–400)
RBC: 3.48 MIL/uL — AB (ref 4.22–5.81)
RDW: 12.1 % (ref 11.5–15.5)
WBC: 21.9 10*3/uL — ABNORMAL HIGH (ref 4.0–10.5)

## 2017-12-10 LAB — BLOOD GAS, ARTERIAL
Acid-base deficit: 4.9 mmol/L — ABNORMAL HIGH (ref 0.0–2.0)
Bicarbonate: 19 mmol/L — ABNORMAL LOW (ref 20.0–28.0)
Drawn by: 252031
O2 Content: 3 L/min
O2 Saturation: 96.2 %
Patient temperature: 98.6
pCO2 arterial: 31 mmHg — ABNORMAL LOW (ref 32.0–48.0)
pH, Arterial: 7.405 (ref 7.350–7.450)
pO2, Arterial: 93.1 mmHg (ref 83.0–108.0)

## 2017-12-10 LAB — ABO/RH: ABO/RH(D): O POS

## 2017-12-10 LAB — TYPE AND SCREEN
ABO/RH(D): O POS
Antibody Screen: NEGATIVE

## 2017-12-10 MED ORDER — LABETALOL HCL 5 MG/ML IV SOLN
10.0000 mg | INTRAVENOUS | Status: DC | PRN
  Administered 2017-12-10: 10 mg via INTRAVENOUS
  Filled 2017-12-10: qty 4

## 2017-12-10 MED ORDER — PERFLUTREN LIPID MICROSPHERE
1.0000 mL | INTRAVENOUS | Status: AC | PRN
  Administered 2017-12-10: 2 mL via INTRAVENOUS
  Filled 2017-12-10: qty 10

## 2017-12-10 MED ORDER — METOPROLOL TARTRATE 12.5 MG HALF TABLET
12.5000 mg | ORAL_TABLET | Freq: Two times a day (BID) | ORAL | Status: DC
  Administered 2017-12-10 – 2017-12-18 (×17): 12.5 mg via ORAL
  Filled 2017-12-10 (×17): qty 1

## 2017-12-10 NOTE — Progress Notes (Signed)
IV team nurse at bedside attempting IV start at this time.

## 2017-12-10 NOTE — Progress Notes (Addendum)
Received call from central telemetry regarding an eposode of tachycardia (140's) and a-fib. Monitor reviewed, heart rate had returned to 105-115 bpm. A-fib intermittent. Reviewed previous ekg, medical history, and morning lab results. No history of a-fib noted in history or previous ekg potassium 3.8 Notified charge Charity fundraiser.

## 2017-12-10 NOTE — Progress Notes (Signed)
  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- no valve vegetations Pleural fluid with WBCs- no bacteria on smear Objective: Vital signs in last 24 hours: Temp:  [97.7 F (36.5 C)-97.8 F (36.6 C)] 97.7 F (36.5 C) (07/20 0527) Pulse Rate:  [70-129] 129 (07/20 0527) Cardiac Rhythm: Normal sinus rhythm;Other (Comment) (07/20 0900) Resp:  [33-47] 41 (07/20 0527) BP: (116-145)/(93-110) 128/94 (07/20 0527) SpO2:  [96 %-100 %] 100 % (07/20 0527) Weight:  [132 lb 0.9 oz (59.9 kg)] 132 lb 0.9 oz (59.9 kg) (07/20 0527)  Hemodynamic parameters for last 24 hours:    Intake/Output from previous day: 07/19 0701 - 07/20 0700 In: 441.9 [I.V.:246; IV Piggyback:195.9] Out: 1025 [Urine:250; Chest Tube:650] Intake/Output this shift: Total I/O In: 100 [IV Piggyback:100] Out: -   Debilitated male No air leak from pigtail  Lab Results: Recent Labs    12/08/17 0345 12/10/17 0350  WBC 32.0* 21.9*  HGB 9.9* 11.2*  HCT 28.3* 33.2*  PLT 509* 592*   BMET:  Recent Labs    12/09/17 0506 12/10/17 0350  NA 132* 136  K 3.8 4.5  CL 98 100  CO2 21* 21*  GLUCOSE 101* 74  BUN 26* 52*  CREATININE 0.85 1.37*  CALCIUM 8.2* 8.3*    PT/INR: No results for input(s): LABPROT, INR in the last 72 hours. ABG    Component Value Date/Time   PHART 7.405 12/10/2017 0610   HCO3 19.0 (L) 12/10/2017 0610   ACIDBASEDEF 4.9 (H) 12/10/2017 0610   O2SAT 96.2 12/10/2017 0610   CBG (last 3)  No results for input(s): GLUCAP in the last 72 hours.  Assessment/Plan: S/P  CXR improved- leave drain to suction and repeat CXR in am   LOS: 3 days    Kathlee Nations Trigt III 12/10/2017

## 2017-12-10 NOTE — Progress Notes (Signed)
Patient Demographics:    Kevin Jimenez, is a 67 y.o. male, DOB - 08-Apr-1951, ZYS:[id number]  Admit date - 12/07/2017   Admitting Physician Briscoe Deutscher, MD  Outpatient Primary MD for the patient is Fleet Contras, MD  LOS - 3   Chief Complaint  Patient presents with  . Fatigue        Subjective:    Kevin Jimenez today has no fevers, no emesis, episode of A. 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 male with a PMH of HIV (elite controller) and chronic hepatitis C , noncompliant with Harvoni, hypertension, polysubstance abuse including heroin, tobacco abuse and history of spontaneous pneumothorax admitted on 12/07/2017 with respiratory symptoms and found to have right-sided pneumonia with parapneumonic effusion and empyema who is currently admitted    Plan:- 1) HIV - ID consult appreciated, ?? Recent non-compliance,  CD4 count is 90 but this may be falsely low due to acute illness C/n  Biktarvy   2) sepsis secondary to rt sided PNA with  Lung abscess and empyema (large right pleural effusion) - less tachypneic after s/p 14 F Right basilar pleural drainage catheter (Pigtail chest tube)- Keep to LWS at 20 cm water, 400 mL removed on 12/09/2017, about a liter out as of 12/10/17, no hypoxia , CT surgery consult appreciated , they do not recommend  VATS for now, culture with haemophilus influenza,, c/n IV Unasyn, infectious disease has discontinued iv Vancomycin.  Please note the patient is immunocompromised given his HIV status with poor compliance with HIV regimen recently.  Leukocytosis noted,  3)FTT--- per  ID physician patient's current weight is consistent with his previous weight in the ID clinic, c/n Remeron 15 mg qhs  for appetite stimulation  4)HYponatremia--multifactorial, on admission sodium was 122, improved to 136,   5)AKI----acute kidney injury  -   resolved with hydration, creatinine on admission= 1.71  ,   baseline creatinine =  0.76   , creatinine is now= 1.3      ,  Avoid nephrotoxic agents/dehydration/hypotension  6)Hep C--apparently is noncompliant with Harvoni  7)Paroxysmal Afib--- not a candidate for anticoagulation as patient may need further surgical intervention for his right chest empyema, he is  back in sinus rhythm now, give metoprolol 12.5 mg twice daily  Code Status : DNR  Disposition Plan  : home   Consults  :  ID/IR/CT surgery  Procedures- right-sided 14 F pigtail chest tube catheter (s/p Right basilar pleural drainage catheter) for right-sided empyema/abscess by IR  DVT Prophylaxis  :   Heparin - SCDs (hold off on heparin to allow for drainage of empyema)  Lab Results  Component Value Date   PLT 592 (H) 12/10/2017    Inpatient Medications  Scheduled Meds: . bictegravir-emtricitabine-tenofovir AF  1 tablet Oral Daily  . feeding supplement (ENSURE ENLIVE)  237 mL Oral TID BM  . guaiFENesin  600 mg Oral BID  . multivitamin  with minerals  1 tablet Oral Daily  . nicotine  14 mg Transdermal Daily  . sodium chloride flush  5 mL Intracatheter Q8H  . thiamine  100 mg Intravenous Daily   Continuous Infusions: . ampicillin-sulbactam (UNASYN) IV 3 g (12/10/17 0651)  . dextrose 5 % and 0.9% NaCl 75 mL/hr at 12/10/17 0206   PRN Meds:.benzonatate, ibuprofen, labetalol, ondansetron **OR** ondansetron (ZOFRAN) IV    Anti-infectives (From admission, onward)   Start     Dose/Rate Route Frequency Ordered Stop   12/08/17 1430  Ampicillin-Sulbactam (UNASYN) 3 g in sodium chloride 0.9 % 100 mL IVPB     3 g 200 mL/hr over 30 Minutes Intravenous Every 8 hours  12/08/17 0915     12/08/17 0630  azithromycin (ZITHROMAX) 500 mg in sodium chloride 0.9 % 250 mL IVPB  Status:  Discontinued     500 mg 250 mL/hr over 60 Minutes Intravenous Every 24 hours 12/07/17 1014 12/07/17 1442   12/08/17 0600  cefTRIAXone (ROCEPHIN) 2 g in sodium chloride 0.9 % 100 mL IVPB  Status:  Discontinued     2 g 200 mL/hr over 30 Minutes Intravenous Every 24 hours 12/07/17 1014 12/07/17 1442   12/08/17 0300  vancomycin (VANCOCIN) 500 mg in sodium chloride 0.9 % 100 mL IVPB  Status:  Discontinued     500 mg 100 mL/hr over 60 Minutes Intravenous Every 12 hours 12/07/17 1455 12/09/17 1423   12/07/17 1800  bictegravir-emtricitabine-tenofovir AF (BIKTARVY) 50-200-25 MG per tablet 1 tablet     1 tablet Oral Daily 12/07/17 1651     12/07/17 1500  vancomycin (VANCOCIN) IVPB 1000 mg/200 mL premix     1,000 mg 200 mL/hr over 60 Minutes Intravenous  Once 12/07/17 1455 12/07/17 1638   12/07/17 1500  piperacillin-tazobactam (ZOSYN) IVPB 3.375 g  Status:  Discontinued     3.375 g 12.5 mL/hr over 240 Minutes Intravenous Every 8 hours 12/07/17 1455 12/08/17 0915   12/07/17 0245  cefTRIAXone (ROCEPHIN) 2 g in sodium chloride 0.9 % 100 mL IVPB  Status:  Discontinued     2 g 200 mL/hr over 30 Minutes Intravenous Every 24 hours 12/07/17 0236 12/07/17 1007   12/07/17 0245  azithromycin (ZITHROMAX) 500 mg in sodium chloride 0.9 % 250 mL IVPB  Status:  Discontinued     500 mg 250 mL/hr over 60 Minutes Intravenous Every 24 hours 12/07/17 0236 12/07/17 1007        Objective:   Vitals:   12/09/17 1825 12/09/17 1830 12/09/17 2135 12/10/17 0527  BP:  (!) 130/97 (!) 143/105 (!) 128/94  Pulse: (!) 111 (!) 111 (!) 114 (!) 129  Resp: (!) 33 (!) 47  (!) 41  Temp:   97.8 F (36.6 C) 97.7 F (36.5 C)  TempSrc:   Oral Oral  SpO2: 97% 96% 100% 100%  Weight:    59.9 kg (132 lb 0.9 oz)  Height:        Wt Readings from Last 3 Encounters:  12/10/17 59.9 kg (132 lb 0.9 oz)  04/04/17 52.2 kg (115  lb)  03/30/17 53.1 kg (117 lb)     Intake/Output Summary (Last 24 hours) at 12/10/2017 1335 Last data filed at 12/10/2017 0900 Gross per 24 hour  Intake 295.87 ml  Output 1025 ml  Net -729.13 ml     Physical Exam  Gen:- Awake Alert, cachectic HEENT:- [place].AT, No sclera icterus Neck-Supple Neck,No JVD,.  Lungs-right-sided pigtail chest tube, appears to be draining well CV- S1, S2  normal, regular Abd-  +ve B.Sounds, Abd Soft, No tenderness,    Extremity/Skin:- No  edema,   Good pulses  Psych-affect is appropriate, oriented x3 Neuro-no new focal deficits, no tremors   Data Review:   Micro Results Recent Results (from the past 240 hour(s))  Urine culture     Status: Abnormal   Collection Time: 12/07/17  3:00 AM  Result Value Ref Range Status   Specimen Description URINE, RANDOM  Final   Special Requests   Final    NONE Performed at [facility], [address], [place], [place] [address]    Culture MULTIPLE SPECIES PRESENT, SUGGEST RECOLLECTION (A)  Final   Report Status 12/08/2017 FINAL  Final  Blood Culture (routine x 2)     Status: None (Preliminary result)   Collection Time: 12/07/17  3:32 AM  Result Value Ref Range Status   Specimen Description BLOOD RIGHT ANTECUBITAL  Final   Special Requests   Final    BOTTLES DRAWN AEROBIC AND ANAEROBIC Blood Culture adequate volume   Culture   Final    NO GROWTH 2 DAYS Performed at [facility], [address] [place], [place] [address]    Report Status PENDING  Incomplete  Blood Culture (routine x 2)     Status: None (Preliminary result)   Collection Time: 12/07/17  3:47 AM  Result Value Ref Range Status   Specimen Description BLOOD RIGHT WRIST  Final   Special Requests   Final    BOTTLES DRAWN AEROBIC AND ANAEROBIC Blood Culture adequate volume   Culture   Final    NO GROWTH 2 DAYS Performed at [facility], [address], [place], [place] [address]    Report Status PENDING  Incomplete  Culture,  respiratory (NON-Expectorated)     Status: None   Collection Time: 12/07/17  6:00 AM  Result Value Ref Range Status   Specimen Description SPUTUM  Final   Special Requests NONE  Final   Gram Stain   Final    FEW WBC PRESENT, PREDOMINANTLY PMN ABUNDANT GRAM POSITIVE COCCI IN PAIRS MODERATE GRAM NEGATIVE RODS    Culture   Final    ABUNDANT HAEMOPHILUS INFLUENZAE BETA LACTAMASE NEGATIVE Performed at [facility], [address] [place], [place] [address]    Report Status 12/09/2017 FINAL  Final  Body fluid culture     Status: None (Preliminary result)   Collection Time: 12/09/17  6:28 PM  Result Value Ref Range Status   Specimen Description FLUID RIGHT PLEURAL  Final   Special Requests NONE  Final   Gram Stain   Final    MODERATE WBC PRESENT, PREDOMINANTLY PMN NO ORGANISMS SEEN    Culture   Final    NO GROWTH < 12 HOURS Performed at [facility], [address], [place], [place] [address]    Report Status PENDING  Incomplete    Radiology Reports Dg Chest 2 View  Result Date: 12/07/2017 CLINICAL DATA:  67 year old male with shortness of breath and cough. EXAM: CHEST - 2 VIEW COMPARISON:  Chest radiograph dated 04/04/2017 FINDINGS: Large area of consolidative change at the right lung base abutting the lateral pleural surface. Although this may represent pneumonia a mass is not excluded. Clinical correlation and follow-up to resolution recommended. There is a small right pleural effusion. The left lung is clear. No pneumothorax. The cardiac silhouette is within normal limits. No acute osseous pathology. IMPRESSION: Right lower lobe consolidation may  represent pneumonia. Clinical correlation and follow-up to resolution recommended to exclude an underlying mass. Trace right pleural effusion. Electronically Signed   By: Elgie Collard M.D.   On: 12/07/2017 02:25   Ct Head Wo Contrast  Result Date: 12/07/2017 CLINICAL DATA:  67 year old male with shortness of breath and  altered mental status. EXAM: CT HEAD WITHOUT CONTRAST TECHNIQUE: Contiguous axial images were obtained from the base of the skull through the vertex without intravenous contrast. COMPARISON:  None. FINDINGS: Brain: The ventricles and sulci appropriate size for patient's age. Mild periventricular and deep white matter chronic microvascular ischemic changes noted. 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 There is no acute intracranial hemorrhage. No mass effect or midline shift. No extra-axial fluid collection. Vascular: No hyperdense vessel or unexpected calcification. Skull: Normal. Negative for fracture or focal lesion. Sinuses/Orbits: Mild diffuse mucoperiosteal thickening of paranasal sinuses. No air-fluid levels. The mastoid air cells are clear. Other: None IMPRESSION: 1. No acute intracranial pathology. 2. Mild chronic microvascular ischemic changes. Electronically Signed   By: Elgie Collard M.D.   On: 12/07/2017 02:23   Ct Chest W Contrast  Result Date: 12/07/2017 CLINICAL DATA:  Cough and shortness of breath, HIV. Unintentional weight loss. EXAM: CT CHEST WITH CONTRAST TECHNIQUE: Multidetector CT imaging of the chest was performed during intravenous contrast administration. CONTRAST:  OMNIPAQUE IOHEXOL 300 MG/ML  SOLN COMPARISON:  Chest radiographs 12/07/2017 and CT chest 03/30/2017. FINDINGS: Cardiovascular: Atherosclerotic calcification of the arterial vasculature, including coronary arteries. Heart is at the upper limits normal in size to mildly enlarged. No pericardial effusion. Mediastinum/Nodes: Mediastinal lymph nodes measure up to 9 mm in the low right paratracheal station. Hilar lymph nodes measure up to 1.4 cm on the right. No axillary adenopathy. Esophagus is grossly unremarkable. Lungs/Pleura: Right lower lobe consolidation with areas of decreased attenuation, air and air-fluid levels. Highly loculated large right pleural effusion.  Centrilobular and paraseptal emphysema. Subpleural ground-glass in the posterior left upper lobe, likely infectious or inflammatory in etiology. Small left pleural effusion, simple in appearance, with minimal compressive atelectasis in the left lower lobe. Upper Abdomen: Visualized portion of the liver is mildly heterogeneous, which may be due to arterial phase imaging. There are 2 areas of hyper attenuation within the right hepatic lobe, measuring up to 1.6 cm visualized portions of the adrenal glands, kidneys, spleen, pancreas, stomach and bowel are grossly unremarkable. Upper abdominal lymph nodes are not enlarged by CT size criteria. Musculoskeletal: No worrisome lytic or sclerotic lesions. IMPRESSION: 1. Right lower lobe consolidation is likely due to pneumonia. Internal fluid, air and air-fluid levels indicate associated necrosis/abscess formation. An underlying mass cannot be definitively excluded. Follow-up to clearing is recommended. 2. Large loculated right pleural effusion is indicative of an empyema. 3. Right hilar adenopathy, likely reactive. This can also be re-evaluated on follow-up imaging. 4. Small left pleural effusion. 5. Hyperattenuating lesions in the right hepatic lobe are difficult to further characterize but may represent perfusion anomalies or flash fill hemangiomas. If further evaluation is desired, MR abdomen without and with contrast is recommended. 6. Aortic atherosclerosis (ICD10-170.0). Coronary artery calcification. 7.  Emphysema (ICD10-J43.9). Electronically Signed   By: Leanna Battles M.D.   On: 12/07/2017 12:57   Dg Chest Port 1 View  Result Date: 12/10/2017 CLINICAL DATA:  Empyema.  Status post drainage catheter placement. EXAM: PORTABLE CHEST 1 VIEW COMPARISON:  CT scan 12/07/2017 and chest x-ray, same date. FINDINGS: The cardiac silhouette, mediastinal and hilar  contours are within normal limits and stable. The left lung remains clear. There is a pigtail catheter noted at  the right lung base which was placed into the empyema cavity. The loculated right basilar fluid collection is smaller. There is surrounding pneumonia and atelectasis which also appears slightly improved. IMPRESSION: Right basilar pleural drainage catheter in good position with decreased loculated right pleural fluid collection/empyema. Persistent right lower lobe pneumonia and atelectasis but it may be slightly improved. The left lung remains clear. Electronically Signed   By: Rudie Meyer M.D.   On: 12/10/2017 08:40   Ct Image Guided Drainage By Percutaneous Catheter  Result Date: 12/10/2017 INDICATION: Sepsis, pneumonia, HIV, right parapneumonic effusion versus empyema EXAM: CT-GUIDED 14 FRENCH RIGHT CHEST TUBE INSERTION MEDICATIONS: The patient is currently admitted to the hospital and receiving intravenous antibiotics. The antibiotics were administered within an appropriate time frame prior to the initiation of the procedure. ANESTHESIA/SEDATION: 2.0 mg IV Versed 100 mcg IV Fentanyl Moderate Sedation Time:  20 MINUTES The patient was continuously monitored during the procedure by the interventional radiology nurse under my direct supervision. COMPLICATIONS: None immediate. TECHNIQUE: Informed written consent was obtained from the patient after a thorough discussion of the procedural risks, benefits and alternatives. All questions were addressed. Maximal Sterile Barrier Technique was utilized including caps, mask, sterile gowns, sterile gloves, sterile drape, hand hygiene and skin antiseptic. A timeout was performed prior to the initiation of the procedure. PROCEDURE: The right lateral chest was prepped with ChloraPrep in a sterile fashion, and a sterile drape was applied covering the operative field. A sterile gown and sterile gloves were used for the procedure. Local anesthesia was provided with 1% Lidocaine. Previous imaging reviewed. Patient positioned supine. Noncontrast localization CT performed. The  large loculated right pleural effusion was localized. A right lateral approach mid axillary line was marked. Under sterile conditions and local anesthesia, an 18 gauge 10 cm access needle was advanced percutaneously right lateral axillary approach into the pleural effusion. There was return of exudative fluid. Amplatz guidewire inserted followed by tract dilatation to insert a 14 French drain. Drain catheter position confirmed with CT. Chest tube connected to pleura vac. 400 cc removed. Sample sent for culture. Catheter secured with a Prolene suture and a sterile dressing. No immediate complication. Patient tolerated the procedure well. FINDINGS: Imaging confirms needle placed in the right loculated effusion for drain insertion IMPRESSION: Successful CT-guided 14 French right chest tube insertion Electronically Signed   By: Judie Petit.  Shick M.D.   On: 12/10/2017 08:17     CBC Recent Labs  Lab 12/07/17 0125 12/08/17 0345 12/10/17 0350  WBC 27.4* 32.0* 21.9*  HGB 10.4* 9.9* 11.2*  HCT 30.3* 28.3* 33.2*  PLT 541* 509* 592*  MCV 95.3 92.8 95.4  MCH 32.7 32.5 32.2  MCHC 34.3 35.0 33.7  RDW 11.3* 11.4* 12.1  LYMPHSABS 1.1  --   --   MONOABS 0.0*  --   --   EOSABS 0.0  --   --   BASOSABS 0.0  --   --     Chemistries  Recent Labs  Lab 12/07/17 0125 12/07/17 0955 12/09/17 0506 12/10/17 0350  NA 122* 128* 132* 136  K 4.0 4.0 3.8 4.5  CL 90* 100 98 100  CO2 19* 18* 21* 21*  GLUCOSE 122* 131* 101* 74  BUN 43* 35* 26* 52*  CREATININE 1.71* 1.13 0.85 1.37*  CALCIUM 8.2* 7.6* 8.2* 8.3*  AST 55*  --   --  2,053*  ALT  25  --   --  538*  ALKPHOS 42  --   --  60  BILITOT 1.1  --   --  1.0   ------------------------------------------------------------------------------------------------------------------ No results for input(s): CHOL, HDL, LDLCALC, TRIG, CHOLHDL, LDLDIRECT in the last 72 hours.  No results found for:  HGBA1C ------------------------------------------------------------------------------------------------------------------ No results for input(s): TSH, T4TOTAL, T3FREE, THYROIDAB in the last 72 hours.  Invalid input(s): FREET3 ------------------------------------------------------------------------------------------------------------------ No results for input(s): VITAMINB12, FOLATE, FERRITIN, TIBC, IRON, RETICCTPCT in the last 72 hours.  Coagulation profile Recent Labs  Lab 12/07/17 1128  INR 1.26    No results for input(s): DDIMER in the last 72 hours.  Cardiac Enzymes No results for input(s): CKMB, TROPONINI, MYOGLOBIN in the last 168 hours.  Invalid input(s): CK ------------------------------------------------------------------------------------------------------------------ No results found for: BNP   Shon Hale M.D on 12/10/2017 at 1:35 PM   Go to [email or web address] - password [email or web address] for contact info  [facility] - Office  [phone]

## 2017-12-10 NOTE — Progress Notes (Signed)
Patient has prn IV labetalol order for heart rate greater than 110. Medication pulled to administer, when assessing patient noted that IV was dislodged. Attempt to restart unsuccessful.  Notified charge Charity fundraiser.

## 2017-12-10 NOTE — Progress Notes (Signed)
  Echocardiogram 2D Echocardiogram has been performed.  Kevin Jimenez F 12/10/2017, 11:30 AM

## 2017-12-10 NOTE — Progress Notes (Signed)
Second nurse unsuccessful in starting IV, consult for IV team placed at this time, charge nurse aware.

## 2017-12-10 NOTE — Progress Notes (Signed)
Referring Physician(s): Dr Mariea Clonts  Supervising Physician: Ruel Favors  Patient Status:  [facility] - In-pt  Chief Complaint:  Rt empyema  Subjective:  Rt chest tube drain placed 7/19 in IR Pt feels better today Breathing little easier CT intact; 900 cc in pleurvac CXR today:  Right basilar pleural drainage catheter in good position with decreased loculated right pleural fluid collection/empyema. Persistent right lower lobe pneumonia and atelectasis but it may be slightly improved. The left lung remains clear.  Allergies: Patient has no known allergies.  Medications: Prior to Admission medications   Medication Sig Start Date End Date Taking? Authorizing Provider  lisinopril-hydrochlorothiazide (PRINZIDE,ZESTORETIC) 10-12.5 MG tablet Take 1 tablet daily by mouth. 02/04/17  Yes [provider]  meloxicam (MOBIC) 15 MG tablet Take 1 tablet (15 mg total) by mouth daily. 08/04/15  Yes Delano Metz, MD  VENTOLIN HFA 108 [phone] Base) MCG/ACT inhaler Take 2 puffs 4 (four) times daily as needed by mouth for shortness of breath. 02/03/17  Yes [provider]  emtricitabine-tenofovir AF (DESCOVY) 200-25 MG tablet Take 1 tablet by mouth daily. Patient not taking: Reported on 12/07/2017 07/16/15   Gardiner Barefoot, MD  gabapentin (NEURONTIN) 100 MG capsule Take 1 capsule (100 mg total) by mouth every 8 (eight) hours. Patient not taking: Reported on 12/07/2017 08/04/15   Delano Metz, MD  Ledipasvir-Sofosbuvir (HARVONI) 90-400 MG TABS Take 1 tablet by mouth daily. Patient not taking: Reported on 12/07/2017 01/21/16   Gardiner Barefoot, MD     Vital Signs: BP (!) 128/94 (BP Location: Right Arm)   Pulse (!) 129   Temp 97.7 F (36.5 C) (Oral)   Resp (!) 41   Ht 5\' 11"  (1.803 m)   Wt 132 lb 0.9 oz (59.9 kg)   SpO2 100%   BMI 18.42 kg/m   Physical Exam  Pulmonary/Chest: He has wheezes. He has rales.  Neurological: He is alert.  Skin: Skin is warm and dry.  Site  of Rt CT is clean and dry NT no bleeding No air leak in Pleurvac 900 cc serous OP   Vitals reviewed.   Imaging: Dg Chest 2 View  Result Date: 12/07/2017 CLINICAL DATA:  67 year old male with shortness of breath and cough. EXAM: CHEST - 2 VIEW COMPARISON:  Chest radiograph dated 04/04/2017 FINDINGS: Large area of consolidative change at the right lung base abutting the lateral pleural surface. Although this may represent pneumonia a mass is not excluded. Clinical correlation and follow-up to resolution recommended. There is a small right pleural effusion. The left lung is clear. No pneumothorax. The cardiac silhouette is within normal limits. No acute osseous pathology. IMPRESSION: Right lower lobe consolidation may represent pneumonia. Clinical correlation and follow-up to resolution recommended to exclude an underlying mass. Trace right pleural effusion. Electronically Signed   By: 13/04/2017 M.D.   On: 12/07/2017 02:25   Ct Head Wo Contrast  Result Date: 12/07/2017 CLINICAL DATA:  67 year old male with shortness of breath and altered mental status. EXAM: CT HEAD WITHOUT CONTRAST TECHNIQUE: Contiguous axial images were obtained from the base of the skull through the vertex without intravenous contrast. COMPARISON:  None. FINDINGS: Brain: The ventricles and sulci appropriate size for patient's age. Mild periventricular and deep white matter chronic microvascular ischemic changes noted. 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 There is no acute intracranial hemorrhage. No mass effect or midline shift. No extra-axial fluid collection. Vascular: No hyperdense vessel or unexpected calcification.  Skull: Normal. Negative for fracture or focal lesion. Sinuses/Orbits: Mild diffuse mucoperiosteal thickening of paranasal sinuses. No air-fluid levels. The mastoid air cells are clear. Other: None IMPRESSION: 1. No acute intracranial pathology. 2. Mild chronic  microvascular ischemic changes. Electronically Signed   By: Elgie Collard M.D.   On: 12/07/2017 02:23   Ct Chest W Contrast  Result Date: 12/07/2017 CLINICAL DATA:  Cough and shortness of breath, HIV. Unintentional weight loss. EXAM: CT CHEST WITH CONTRAST TECHNIQUE: Multidetector CT imaging of the chest was performed during intravenous contrast administration. CONTRAST:  OMNIPAQUE IOHEXOL 300 MG/ML  SOLN COMPARISON:  Chest radiographs 12/07/2017 and CT chest 03/30/2017. FINDINGS: Cardiovascular: Atherosclerotic calcification of the arterial vasculature, including coronary arteries. Heart is at the upper limits normal in size to mildly enlarged. No pericardial effusion. Mediastinum/Nodes: Mediastinal lymph nodes measure up to 9 mm in the low right paratracheal station. Hilar lymph nodes measure up to 1.4 cm on the right. No axillary adenopathy. Esophagus is grossly unremarkable. Lungs/Pleura: Right lower lobe consolidation with areas of decreased attenuation, air and air-fluid levels. Highly loculated large right pleural effusion. Centrilobular and paraseptal emphysema. Subpleural ground-glass in the posterior left upper lobe, likely infectious or inflammatory in etiology. Small left pleural effusion, simple in appearance, with minimal compressive atelectasis in the left lower lobe. Upper Abdomen: Visualized portion of the liver is mildly heterogeneous, which may be due to arterial phase imaging. There are 2 areas of hyper attenuation within the right hepatic lobe, measuring up to 1.6 cm visualized portions of the adrenal glands, kidneys, spleen, pancreas, stomach and bowel are grossly unremarkable. Upper abdominal lymph nodes are not enlarged by CT size criteria. Musculoskeletal: No worrisome lytic or sclerotic lesions. IMPRESSION: 1. Right lower lobe consolidation is likely due to pneumonia. Internal fluid, air and air-fluid levels indicate associated necrosis/abscess formation. An underlying mass  cannot be definitively excluded. Follow-up to clearing is recommended. 2. Large loculated right pleural effusion is indicative of an empyema. 3. Right hilar adenopathy, likely reactive. This can also be re-evaluated on follow-up imaging. 4. Small left pleural effusion. 5. Hyperattenuating lesions in the right hepatic lobe are difficult to further characterize but may represent perfusion anomalies or flash fill hemangiomas. If further evaluation is desired, MR abdomen without and with contrast is recommended. 6. Aortic atherosclerosis (ICD10-170.0). Coronary artery calcification. 7.  Emphysema (ICD10-J43.9). Electronically Signed   By: Leanna Battles M.D.   On: 12/07/2017 12:57   Dg Chest Port 1 View  Result Date: 12/10/2017 CLINICAL DATA:  Empyema.  Status post drainage catheter placement. EXAM: PORTABLE CHEST 1 VIEW COMPARISON:  CT scan 12/07/2017 and chest x-ray, same date. FINDINGS: The cardiac silhouette, mediastinal and hilar contours are within normal limits and stable. The left lung remains clear. There is a pigtail catheter noted at the right lung base which was placed into the empyema cavity. The loculated right basilar fluid collection is smaller. There is surrounding pneumonia and atelectasis which also appears slightly improved. IMPRESSION: Right basilar pleural drainage catheter in good position with decreased loculated right pleural fluid collection/empyema. Persistent right lower lobe pneumonia and atelectasis but it may be slightly improved. The left lung remains clear. Electronically Signed   By: Rudie Meyer M.D.   On: 12/10/2017 08:40   Ct Image Guided Drainage By Percutaneous Catheter  Result Date: 12/10/2017 INDICATION: Sepsis, pneumonia, HIV, right parapneumonic effusion versus empyema EXAM: CT-GUIDED 14 FRENCH RIGHT CHEST TUBE INSERTION MEDICATIONS: The patient is currently admitted to the hospital and receiving intravenous antibiotics.  The antibiotics were administered within an  appropriate time frame prior to the initiation of the procedure. ANESTHESIA/SEDATION: 2.0 mg IV Versed 100 mcg IV Fentanyl Moderate Sedation Time:  20 MINUTES The patient was continuously monitored during the procedure by the interventional radiology nurse under my direct supervision. COMPLICATIONS: None immediate. TECHNIQUE: Informed written consent was obtained from the patient after a thorough discussion of the procedural risks, benefits and alternatives. All questions were addressed. Maximal Sterile Barrier Technique was utilized including caps, mask, sterile gowns, sterile gloves, sterile drape, hand hygiene and skin antiseptic. A timeout was performed prior to the initiation of the procedure. PROCEDURE: The right lateral chest was prepped with ChloraPrep in a sterile fashion, and a sterile drape was applied covering the operative field. A sterile gown and sterile gloves were used for the procedure. Local anesthesia was provided with 1% Lidocaine. Previous imaging reviewed. Patient positioned supine. Noncontrast localization CT performed. The large loculated right pleural effusion was localized. A right lateral approach mid axillary line was marked. Under sterile conditions and local anesthesia, an 18 gauge 10 cm access needle was advanced percutaneously right lateral axillary approach into the pleural effusion. There was return of exudative fluid. Amplatz guidewire inserted followed by tract dilatation to insert a 14 French drain. Drain catheter position confirmed with CT. Chest tube connected to pleura vac. 400 cc removed. Sample sent for culture. Catheter secured with a Prolene suture and a sterile dressing. No immediate complication. Patient tolerated the procedure well. FINDINGS: Imaging confirms needle placed in the right loculated effusion for drain insertion IMPRESSION: Successful CT-guided 14 French right chest tube insertion Electronically Signed   By: Judie Petit.  Shick M.D.   On: 12/10/2017 08:17     Labs:  CBC: Recent Labs    12/07/17 0125 12/08/17 0345 12/10/17 0350  WBC 27.4* 32.0* 21.9*  HGB 10.4* 9.9* 11.2*  HCT 30.3* 28.3* 33.2*  PLT 541* 509* 592*    COAGS: Recent Labs    12/07/17 1128  INR 1.26  APTT 39*    BMP: Recent Labs    12/07/17 0125 12/07/17 0955 12/09/17 0506 12/10/17 0350  NA 122* 128* 132* 136  K 4.0 4.0 3.8 4.5  CL 90* 100 98 100  CO2 19* 18* 21* 21*  GLUCOSE 122* 131* 101* 74  BUN 43* 35* 26* 52*  CALCIUM 8.2* 7.6* 8.2* 8.3*  CREATININE 1.71* 1.13 0.85 1.37*  GFRNONAA 40* >60 >60 52*  GFRAA 46* >60 >60 >60    LIVER FUNCTION TESTS: Recent Labs    12/07/17 0125 12/10/17 0350  BILITOT 1.1 1.0  AST 55* 2,053*  ALT 25 538*  ALKPHOS 42 60  PROT 6.4* 6.3*  ALBUMIN 2.0* 1.7*    Assessment and Plan:  Better today CT intact 900 cc in Pleurvac- serous CXR better; wbc down Will follow  Electronically Signed: Vinson Tietze A, PA-C 12/10/2017, 10:55 AM   I spent a total of 15 Minutes at the the patient's bedside AND on the patient's hospital floor or unit, greater than 50% of which was counseling/coordinating care for R chest tube drain

## 2017-12-11 ENCOUNTER — Inpatient Hospital Stay (HOSPITAL_COMMUNITY): Payer: Medicare Other

## 2017-12-11 MED ORDER — VITAMIN B-1 100 MG PO TABS
100.0000 mg | ORAL_TABLET | Freq: Every day | ORAL | Status: DC
  Administered 2017-12-11 – 2017-12-27 (×17): 100 mg via ORAL
  Filled 2017-12-11 (×17): qty 1

## 2017-12-11 MED ORDER — FOLIC ACID 1 MG PO TABS
1.0000 mg | ORAL_TABLET | Freq: Every day | ORAL | Status: DC
  Administered 2017-12-11 – 2017-12-27 (×17): 1 mg via ORAL
  Filled 2017-12-11 (×17): qty 1

## 2017-12-11 NOTE — Progress Notes (Signed)
Patient Demographics:    Kevin Jimenez, is a 67 y.o. male, DOB - [birth year], WUJ:[id number]  Admit date - 12/07/2017   Admitting Physician Briscoe Deutscher, MD  Outpatient Primary MD for the patient is Fleet Contras, MD  LOS - 4   Chief Complaint  Patient presents with  . 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 male with a PMH of HIV (elite controller) and chronic hepatitis C , noncompliant with Harvoni, hypertension, polysubstance abuse including heroin, tobacco abuse and history of spontaneous pneumothorax admitted on 12/07/2017 with respiratory symptoms and found to have right-sided pneumonia with parapneumonic effusion and empyema   Plan:- 1) HIV - ID consult appreciated, ?? Recent non-compliance,  CD4 count is 90 but this may be falsely low due to acute illness,  C/n  Biktarvy   2)Sepsis secondary to rt sided PNA with  Lung abscess and empyema (large right pleural effusion) - clinically slowly improving after s/p 14 F Right basilar pleural drainage catheter (Pigtail chest tube)- Keep to LWS at 20 cm water, 400 mL removed on 12/09/2017, about 1.25 Liters out as of 12/11/17, no hypoxia , CT surgery consult appreciated , they do not recommend  VATS for now, culture, urine culture and pleural fluid culture are all without growth so far ,  c/n IV Unasyn, infectious disease has discontinued iv Vancomycin.  Please note the patient is immunocompromised given his HIV status with poor compliance with HIV regimen recently.  Leukocytosis noted,  3)FTT--- per ID  physician patient's current weight is consistent with his previous weight in the ID clinic, c/n Remeron 15 mg qhs  for appetite stimulation  4)HYponatremia--multifactorial, on admission sodium was 122, now resolved  5)AKI----acute kidney injury  -   resolved with hydration, creatinine on admission= 1.71  ,   baseline creatinine =  0.76   , creatinine is now= 1.3      ,  Avoid nephrotoxic agents/dehydration/hypotension  6)Hep C--apparently is noncompliant with Harvoni  7)Paroxysmal Afib--- not a candidate for anticoagulation as patient may need further surgical intervention for his right chest empyema, he is  back in sinus rhythm now, c/n metoprolol 12.5 mg twice daily  Code Status : DNR  Disposition Plan  : home   Consults  :  ID/IR/CT surgery  Procedures- right-sided 14 F pigtail chest tube catheter (s/p Right basilar pleural drainage catheter) for right-sided empyema/abscess by IR  DVT Prophylaxis  :   Heparin - SCDs (hold off on heparin to allow for drainage of empyema)  Lab Results  Component Value Date   PLT 592 (H) 12/10/2017    Inpatient Medications  Scheduled Meds: . bictegravir-emtricitabine-tenofovir AF  1 tablet Oral Daily  . feeding supplement (ENSURE ENLIVE)  237 mL Oral TID BM  . folic acid  1 mg Oral Daily  . guaiFENesin  600 mg Oral BID  . metoprolol tartrate  12.5 mg Oral BID  . multivitamin with minerals  1 tablet Oral Daily  . nicotine  14 mg Transdermal Daily  . sodium chloride flush  5 mL Intracatheter Q8H  . thiamine  100 mg Oral Daily   Continuous Infusions: . ampicillin-sulbactam (UNASYN) IV 3 g (12/11/17 1418)  . dextrose 5 % and 0.9% NaCl 75 mL/hr at 12/11/17 0603   PRN Meds:.benzonatate, ibuprofen, labetalol, ondansetron **OR** ondansetron (ZOFRAN) IV    Anti-infectives (From admission, onward)   Start     Dose/Rate Route Frequency Ordered Stop   12/08/17 1430  Ampicillin-Sulbactam (UNASYN) 3 g in sodium chloride 0.9 % 100 mL IVPB     3  g 200 mL/hr over 30 Minutes Intravenous Every 8 hours 12/08/17 0915     12/08/17 0630  azithromycin (ZITHROMAX) 500 mg in sodium chloride 0.9 % 250 mL IVPB  Status:  Discontinued     500 mg 250 mL/hr over 60 Minutes Intravenous Every 24 hours 12/07/17 1014 12/07/17 1442   12/08/17 0600  cefTRIAXone (ROCEPHIN) 2 g in sodium chloride 0.9 % 100 mL IVPB  Status:  Discontinued     2 g 200 mL/hr over 30 Minutes Intravenous Every 24 hours 12/07/17 1014 12/07/17 1442   12/08/17 0300  vancomycin (VANCOCIN) 500 mg in sodium chloride 0.9 % 100 mL IVPB  Status:  Discontinued     500 mg 100 mL/hr over 60 Minutes Intravenous Every 12 hours 12/07/17 1455 12/09/17 1423   12/07/17 1800  bictegravir-emtricitabine-tenofovir AF (BIKTARVY) 50-200-25 MG per tablet 1 tablet     1 tablet Oral Daily 12/07/17 1651     12/07/17 1500  vancomycin (VANCOCIN) IVPB 1000 mg/200 mL premix     1,000 mg 200 mL/hr over 60 Minutes Intravenous  Once 12/07/17 1455 12/07/17 1638   12/07/17 1500  piperacillin-tazobactam (ZOSYN) IVPB 3.375 g  Status:  Discontinued     3.375 g 12.5 mL/hr over 240 Minutes Intravenous Every 8 hours 12/07/17 1455 12/08/17 0915   12/07/17 0245  cefTRIAXone (ROCEPHIN) 2 g in sodium chloride 0.9 % 100 mL IVPB  Status:  Discontinued     2 g 200 mL/hr over 30 Minutes Intravenous Every 24 hours 12/07/17 0236 12/07/17 1007   12/07/17 0245  azithromycin (ZITHROMAX) 500 mg in sodium chloride 0.9 % 250 mL IVPB  Status:  Discontinued     500 mg 250 mL/hr over 60 Minutes Intravenous Every 24 hours 12/07/17 0236 12/07/17 1007        Objective:   Vitals:   12/10/17 2127 12/11/17 0548 12/11/17 0843 12/11/17 1430  BP:  (!) 127/95 (!) 136/100 (!) 145/102  Pulse: (!) 112 100  81  Resp:  (!) 37    Temp:  (!) 97.4 F (36.3 C)  97.7 F (36.5 C)  TempSrc:  Oral  Axillary  SpO2:  100%  100%  Weight:      Height:        Wt Readings from Last 3 Encounters:  12/10/17 59.9 kg (132 lb 0.9 oz)  04/04/17 52.2 kg  (115 lb)  03/30/17 53.1 kg (117 lb)     Intake/Output Summary (Last 24 hours) at 12/11/2017 1523 Last data filed at 12/11/2017 1433 Gross per 24 hour  Intake 1403.65 ml  Output 1600 ml  Net -196.35 ml     Physical Exam  Gen:- Awake Alert, cachectic HEENT:- [place].AT, No sclera icterus Neck-Supple Neck,No JVD,.  Lungs-right-sided pigtail chest tube, appears  to be draining well CV- S1, S2 normal, regular Abd-  +ve B.Sounds, Abd Soft, No tenderness,    Extremity/Skin:- No  edema,   Good pulses  Psych-affect is appropriate, oriented x3 Neuro-no new focal deficits, no tremors   Data Review:   Micro Results Recent Results (from the past 240 hour(s))  Urine culture     Status: Abnormal   Collection Time: 12/07/17  3:00 AM  Result Value Ref Range Status   Specimen Description URINE, RANDOM  Final   Special Requests   Final    NONE Performed at [facility], [address], [place], [place] [address]    Culture MULTIPLE SPECIES PRESENT, SUGGEST RECOLLECTION (A)  Final   Report Status 12/08/2017 FINAL  Final  Blood Culture (routine x 2)     Status: None (Preliminary result)   Collection Time: 12/07/17  3:32 AM  Result Value Ref Range Status   Specimen Description BLOOD RIGHT ANTECUBITAL  Final   Special Requests   Final    BOTTLES DRAWN AEROBIC AND ANAEROBIC Blood Culture adequate volume   Culture   Final    NO GROWTH 4 DAYS Performed at [facility], [address] [place], [place] [address]    Report Status PENDING  Incomplete  Blood Culture (routine x 2)     Status: None (Preliminary result)   Collection Time: 12/07/17  3:47 AM  Result Value Ref Range Status   Specimen Description BLOOD RIGHT WRIST  Final   Special Requests   Final    BOTTLES DRAWN AEROBIC AND ANAEROBIC Blood Culture adequate volume   Culture   Final    NO GROWTH 4 DAYS Performed at [facility], [address] [place], [place] [address]    Report Status PENDING  Incomplete    Culture, respiratory (NON-Expectorated)     Status: None   Collection Time: 12/07/17  6:00 AM  Result Value Ref Range Status   Specimen Description SPUTUM  Final   Special Requests NONE  Final   Gram Stain   Final    FEW WBC PRESENT, PREDOMINANTLY PMN ABUNDANT GRAM POSITIVE COCCI IN PAIRS MODERATE GRAM NEGATIVE RODS    Culture   Final    ABUNDANT HAEMOPHILUS INFLUENZAE BETA LACTAMASE NEGATIVE Performed at [facility], [address], [place], [place] [address]    Report Status 12/09/2017 FINAL  Final  Body fluid culture     Status: None (Preliminary result)   Collection Time: 12/09/17  6:28 PM  Result Value Ref Range Status   Specimen Description FLUID RIGHT PLEURAL  Final   Special Requests NONE  Final   Gram Stain   Final    MODERATE WBC PRESENT, PREDOMINANTLY PMN NO ORGANISMS SEEN    Culture   Final    NO GROWTH 2 DAYS Performed at [facility], [address] [place], [place] [address]    Report Status PENDING  Incomplete    Radiology Reports Dg Chest 2 View  Result Date: 12/07/2017 CLINICAL DATA:  67 year old male with shortness of breath and cough. EXAM: CHEST - 2 VIEW COMPARISON:  Chest radiograph dated 04/04/2017 FINDINGS: Large area of consolidative change at the right lung base abutting the lateral pleural surface. Although this may represent pneumonia a mass is not excluded. Clinical correlation and follow-up to resolution recommended. There is a small right pleural effusion. The left lung is clear. No pneumothorax. The cardiac silhouette is within normal limits. No acute osseous  pathology. IMPRESSION: Right lower lobe consolidation may represent pneumonia. Clinical correlation and follow-up to resolution recommended to exclude an underlying mass. Trace right pleural effusion. Electronically Signed   By: Elgie Collard M.D.   On: 12/07/2017 02:25   Ct Head Wo Contrast  Result Date: 12/07/2017 CLINICAL DATA:  67 year old male with shortness of breath  and altered mental status. EXAM: CT HEAD WITHOUT CONTRAST TECHNIQUE: Contiguous axial images were obtained from the base of the skull through the vertex without intravenous contrast. COMPARISON:  None. FINDINGS: Brain: The ventricles and sulci appropriate size for patient's age. Mild periventricular and deep white matter chronic microvascular ischemic changes noted. 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 There is no acute intracranial hemorrhage. No mass effect or midline shift. No extra-axial fluid collection. Vascular: No hyperdense vessel or unexpected calcification. Skull: Normal. Negative for fracture or focal lesion. Sinuses/Orbits: Mild diffuse mucoperiosteal thickening of paranasal sinuses. No air-fluid levels. The mastoid air cells are clear. Other: None IMPRESSION: 1. No acute intracranial pathology. 2. Mild chronic microvascular ischemic changes. Electronically Signed   By: Elgie Collard M.D.   On: 12/07/2017 02:23   Ct Chest W Contrast  Result Date: 12/07/2017 CLINICAL DATA:  Cough and shortness of breath, HIV. Unintentional weight loss. EXAM: CT CHEST WITH CONTRAST TECHNIQUE: Multidetector CT imaging of the chest was performed during intravenous contrast administration. CONTRAST:  OMNIPAQUE IOHEXOL 300 MG/ML  SOLN COMPARISON:  Chest radiographs 12/07/2017 and CT chest 03/30/2017. FINDINGS: Cardiovascular: Atherosclerotic calcification of the arterial vasculature, including coronary arteries. Heart is at the upper limits normal in size to mildly enlarged. No pericardial effusion. Mediastinum/Nodes: Mediastinal lymph nodes measure up to 9 mm in the low right paratracheal station. Hilar lymph nodes measure up to 1.4 cm on the right. No axillary adenopathy. Esophagus is grossly unremarkable. Lungs/Pleura: Right lower lobe consolidation with areas of decreased attenuation, air and air-fluid levels. Highly loculated large right pleural effusion.  Centrilobular and paraseptal emphysema. Subpleural ground-glass in the posterior left upper lobe, likely infectious or inflammatory in etiology. Small left pleural effusion, simple in appearance, with minimal compressive atelectasis in the left lower lobe. Upper Abdomen: Visualized portion of the liver is mildly heterogeneous, which may be due to arterial phase imaging. There are 2 areas of hyper attenuation within the right hepatic lobe, measuring up to 1.6 cm visualized portions of the adrenal glands, kidneys, spleen, pancreas, stomach and bowel are grossly unremarkable. Upper abdominal lymph nodes are not enlarged by CT size criteria. Musculoskeletal: No worrisome lytic or sclerotic lesions. IMPRESSION: 1. Right lower lobe consolidation is likely due to pneumonia. Internal fluid, air and air-fluid levels indicate associated necrosis/abscess formation. An underlying mass cannot be definitively excluded. Follow-up to clearing is recommended. 2. Large loculated right pleural effusion is indicative of an empyema. 3. Right hilar adenopathy, likely reactive. This can also be re-evaluated on follow-up imaging. 4. Small left pleural effusion. 5. Hyperattenuating lesions in the right hepatic lobe are difficult to further characterize but may represent perfusion anomalies or flash fill hemangiomas. If further evaluation is desired, MR abdomen without and with contrast is recommended. 6. Aortic atherosclerosis (ICD10-170.0). Coronary artery calcification. 7.  Emphysema (ICD10-J43.9). Electronically Signed   By: Leanna Battles M.D.   On: 12/07/2017 12:57   Dg Chest Port 1 View  Result Date: 12/11/2017 CLINICAL DATA:  Shortness of breath. EXAM: PORTABLE CHEST 1 VIEW COMPARISON:  12/10/2017 FINDINGS: Pigtail drainage catheter over right lung base unchanged. No evidence  pneumothorax. Lungs are adequately inflated with persistent opacification over the right mid to lower lung unchanged likely right-sided effusion with  atelectasis. Possible very small amount of left pleural fluid unchanged. Cardiomediastinal silhouette and remainder the exam is unchanged. IMPRESSION: Stable opacification over the right mid to lower lung likely effusion with atelectasis. Pigtail drainage catheter over the right base unchanged. No pneumothorax. Tiny stable amount left pleural fluid. Electronically Signed   By: Elberta Fortis M.D.   On: 12/11/2017 10:51   Dg Chest Port 1 View  Result Date: 12/10/2017 CLINICAL DATA:  Empyema.  Status post drainage catheter placement. EXAM: PORTABLE CHEST 1 VIEW COMPARISON:  CT scan 12/07/2017 and chest x-ray, same date. FINDINGS: The cardiac silhouette, mediastinal and hilar contours are within normal limits and stable. The left lung remains clear. There is a pigtail catheter noted at the right lung base which was placed into the empyema cavity. The loculated right basilar fluid collection is smaller. There is surrounding pneumonia and atelectasis which also appears slightly improved. IMPRESSION: Right basilar pleural drainage catheter in good position with decreased loculated right pleural fluid collection/empyema. Persistent right lower lobe pneumonia and atelectasis but it may be slightly improved. The left lung remains clear. Electronically Signed   By: Rudie Meyer M.D.   On: 12/10/2017 08:40   Ct Image Guided Drainage By Percutaneous Catheter  Result Date: 12/10/2017 INDICATION: Sepsis, pneumonia, HIV, right parapneumonic effusion versus empyema EXAM: CT-GUIDED 14 FRENCH RIGHT CHEST TUBE INSERTION MEDICATIONS: The patient is currently admitted to the hospital and receiving intravenous antibiotics. The antibiotics were administered within an appropriate time frame prior to the initiation of the procedure. ANESTHESIA/SEDATION: 2.0 mg IV Versed 100 mcg IV Fentanyl Moderate Sedation Time:  20 MINUTES The patient was continuously monitored during the procedure by the interventional radiology nurse under my  direct supervision. COMPLICATIONS: None immediate. TECHNIQUE: Informed written consent was obtained from the patient after a thorough discussion of the procedural risks, benefits and alternatives. All questions were addressed. Maximal Sterile Barrier Technique was utilized including caps, mask, sterile gowns, sterile gloves, sterile drape, hand hygiene and skin antiseptic. A timeout was performed prior to the initiation of the procedure. PROCEDURE: The right lateral chest was prepped with ChloraPrep in a sterile fashion, and a sterile drape was applied covering the operative field. A sterile gown and sterile gloves were used for the procedure. Local anesthesia was provided with 1% Lidocaine. Previous imaging reviewed. Patient positioned supine. Noncontrast localization CT performed. The large loculated right pleural effusion was localized. A right lateral approach mid axillary line was marked. Under sterile conditions and local anesthesia, an 18 gauge 10 cm access needle was advanced percutaneously right lateral axillary approach into the pleural effusion. There was return of exudative fluid. Amplatz guidewire inserted followed by tract dilatation to insert a 14 French drain. Drain catheter position confirmed with CT. Chest tube connected to pleura vac. 400 cc removed. Sample sent for culture. Catheter secured with a Prolene suture and a sterile dressing. No immediate complication. Patient tolerated the procedure well. FINDINGS: Imaging confirms needle placed in the right loculated effusion for drain insertion IMPRESSION: Successful CT-guided 14 French right chest tube insertion Electronically Signed   By: Judie Petit.  Shick M.D.   On: 12/10/2017 08:17     CBC Recent Labs  Lab 12/07/17 0125 12/08/17 0345 12/10/17 0350  WBC 27.4* 32.0* 21.9*  HGB 10.4* 9.9* 11.2*  HCT 30.3* 28.3* 33.2*  PLT 541* 509* 592*  MCV 95.3 92.8 95.4  MCH 32.7  32.5 32.2  MCHC 34.3 35.0 33.7  RDW 11.3* 11.4* 12.1  LYMPHSABS 1.1  --    --   MONOABS 0.0*  --   --   EOSABS 0.0  --   --   BASOSABS 0.0  --   --     Chemistries  Recent Labs  Lab 12/07/17 0125 12/07/17 0955 12/09/17 0506 12/10/17 0350  NA 122* 128* 132* 136  K 4.0 4.0 3.8 4.5  CL 90* 100 98 100  CO2 19* 18* 21* 21*  GLUCOSE 122* 131* 101* 74  BUN 43* 35* 26* 52*  CREATININE 1.71* 1.13 0.85 1.37*  CALCIUM 8.2* 7.6* 8.2* 8.3*  AST 55*  --   --  2,053*  ALT 25  --   --  538*  ALKPHOS 42  --   --  60  BILITOT 1.1  --   --  1.0   ------------------------------------------------------------------------------------------------------------------ No results for input(s): CHOL, HDL, LDLCALC, TRIG, CHOLHDL, LDLDIRECT in the last 72 hours.  No results found for: HGBA1C ------------------------------------------------------------------------------------------------------------------ No results for input(s): TSH, T4TOTAL, T3FREE, THYROIDAB in the last 72 hours.  Invalid input(s): FREET3 ------------------------------------------------------------------------------------------------------------------ No results for input(s): VITAMINB12, FOLATE, FERRITIN, TIBC, IRON, RETICCTPCT in the last 72 hours.  Coagulation profile Recent Labs  Lab 12/07/17 1128  INR 1.26    No results for input(s): DDIMER in the last 72 hours.  Cardiac Enzymes No results for input(s): CKMB, TROPONINI, MYOGLOBIN in the last 168 hours.  Invalid input(s): CK ------------------------------------------------------------------------------------------------------------------ No results found for: BNP   Shon Hale M.D on 12/11/2017 at 3:23 PM   Go to [email or web address] - password [email or web address] for contact info  [facility] - Office  [phone]

## 2017-12-11 NOTE — Progress Notes (Addendum)
  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    Lab Results: Recent Labs    12/10/17 0350  WBC 21.9*  HGB 11.2*  HCT 33.2*  PLT 592*   BMET:  Recent Labs    12/09/17 0506 12/10/17 0350  NA 132* 136  K 3.8 4.5  CL 98 100  CO2 21* 21*  GLUCOSE 101* 74  BUN 26* 52*  CREATININE 0.85 1.37*  CALCIUM 8.2* 8.3*    PT/INR: No results for input(s): LABPROT, INR in the last 72 hours. ABG    Component Value Date/Time   PHART 7.405 12/10/2017 0610   HCO3 19.0 (L) 12/10/2017 0610   ACIDBASEDEF 4.9 (H) 12/10/2017 0610   O2SAT 96.2 12/10/2017 0610   CBG (last 3)  No results for input(s): GLUCAP in the last 72 hours.  Assessment/Plan: S/P   Continue current care Appropriate for palliative care evaluation and discussion with patient  LOS: 4 days    Kathlee Nations Trigt III 12/11/2017

## 2017-12-12 ENCOUNTER — Inpatient Hospital Stay (HOSPITAL_COMMUNITY): Payer: Medicare Other

## 2017-12-12 DIAGNOSIS — Z95828 Presence of other vascular implants and grafts: Secondary | ICD-10-CM

## 2017-12-12 DIAGNOSIS — B1920 Unspecified viral hepatitis C without hepatic coma: Secondary | ICD-10-CM

## 2017-12-12 DIAGNOSIS — I10 Essential (primary) hypertension: Secondary | ICD-10-CM

## 2017-12-12 DIAGNOSIS — J449 Chronic obstructive pulmonary disease, unspecified: Secondary | ICD-10-CM

## 2017-12-12 DIAGNOSIS — B963 Hemophilus influenzae [H. influenzae] as the cause of diseases classified elsewhere: Secondary | ICD-10-CM

## 2017-12-12 DIAGNOSIS — F1910 Other psychoactive substance abuse, uncomplicated: Secondary | ICD-10-CM

## 2017-12-12 DIAGNOSIS — J852 Abscess of lung without pneumonia: Secondary | ICD-10-CM

## 2017-12-12 LAB — BASIC METABOLIC PANEL
ANION GAP: 10 (ref 5–15)
BUN: 41 mg/dL — ABNORMAL HIGH (ref 8–23)
CALCIUM: 7.8 mg/dL — AB (ref 8.9–10.3)
CO2: 23 mmol/L (ref 22–32)
CREATININE: 0.95 mg/dL (ref 0.61–1.24)
Chloride: 110 mmol/L (ref 98–111)
Glucose, Bld: 107 mg/dL — ABNORMAL HIGH (ref 70–99)
Potassium: 3.7 mmol/L (ref 3.5–5.1)
SODIUM: 143 mmol/L (ref 135–145)

## 2017-12-12 LAB — CBC
HCT: 30.9 % — ABNORMAL LOW (ref 39.0–52.0)
Hemoglobin: 10.1 g/dL — ABNORMAL LOW (ref 13.0–17.0)
MCH: 32.6 pg (ref 26.0–34.0)
MCHC: 32.7 g/dL (ref 30.0–36.0)
MCV: 99.7 fL (ref 78.0–100.0)
PLATELETS: 445 10*3/uL — AB (ref 150–400)
RBC: 3.1 MIL/uL — ABNORMAL LOW (ref 4.22–5.81)
RDW: 12.5 % (ref 11.5–15.5)
WBC: 12.6 10*3/uL — AB (ref 4.0–10.5)

## 2017-12-12 LAB — CULTURE, BLOOD (ROUTINE X 2)
Culture: NO GROWTH
Culture: NO GROWTH
Special Requests: ADEQUATE
Special Requests: ADEQUATE

## 2017-12-12 MED ORDER — BOOST / RESOURCE BREEZE PO LIQD CUSTOM
1.0000 | Freq: Two times a day (BID) | ORAL | Status: DC
  Administered 2017-12-12 – 2017-12-26 (×9): 1 via ORAL
  Filled 2017-12-12: qty 1

## 2017-12-12 MED ORDER — ENSURE ENLIVE PO LIQD
237.0000 mL | Freq: Two times a day (BID) | ORAL | Status: DC
  Administered 2017-12-14 – 2017-12-27 (×15): 237 mL via ORAL

## 2017-12-12 NOTE — Progress Notes (Signed)
Nutrition Follow-up / Consult  DOCUMENTATION CODES:   Severe malnutrition in context of chronic illness, Underweight  INTERVENTION:    Try Boost Breeze BID, each supplement provides 250 kcal and 9 grams of protein  Magic cup TID with meals, each supplement provides 290 kcal and 9 grams of protein  Continue Ensure Enlive po BID, each supplement provides 350 kcal and 20 grams of protein  NUTRITION DIAGNOSIS:   Severe Malnutrition related to chronic illness(HIV) as evidenced by severe fat depletion, severe muscle depletion.  Ongoing  GOAL:   Patient will meet greater than or equal to 90% of their needs  Unmet  MONITOR:   PO intake, Supplement acceptance  REASON FOR ASSESSMENT:   Consult Poor PO  ASSESSMENT:   68 yo male with PMH of substance abuse, tobacco dependence, HTN, HIV (not on antivirals for several months), COPD, and chronic hepatitis C who was admitted on 7/17 with PNA, small left pleural effusion, large loculated right pleural effusion.  RN reports patient is eating very poorly. He only takes bites of meals and has not been drinking the Ensure Enlive supplements.   Patient reports that he is feeling much better after his PT session today. He requests fruits and agreed to try Boost Breeze supplement. Will also send magic cup with meals to maximize intake of protein and calories.  Labs and medications reviewed.    Diet Order:   Diet Order           Diet regular Room service appropriate? Yes; Fluid consistency: Thin  Diet effective now          EDUCATION NEEDS:   No education needs have been identified at this time  Skin:  Skin Assessment: Reviewed RN Assessment  Last BM:  7/17  Height:   Ht Readings from Last 1 Encounters:  12/07/17 5\' 11"  (1.803 m)    Weight:   Wt Readings from Last 1 Encounters:  12/10/17 132 lb 0.9 oz (59.9 kg)    Ideal Body Weight:  78.2 kg  BMI:  Body mass index is 18.42 kg/m.  Estimated Nutritional Needs:    Kcal:  1800-2000  Protein:  80-90 gm  Fluid:  1.8 L    12/12/17, RD, LDN, CNSC Pager [phone] After Hours Pager [phone]

## 2017-12-12 NOTE — Progress Notes (Signed)
[facility]  Date of Admission:  12/07/2017     Total days of antibiotics 6         ASSESSMENT/PLAN  Kevin Jimenez is a 67 y/o male with previous medical history of HIV, polysubstance abuse, hypertension, COPD, and Hepatitis C admitted to the hospital on 12/07/17 with the chief complaint of generalized weakness and cough and found to have a lung abscess and empyema.   AIDS - Viral load of 240 and CD4 count of 90 on 7/17 and started on Biktarvy. Tolerating medication with no adverse side effects. Continue current dosage of Biktarvy. Will need follow up with ID office in 1 month to recheck viral load and CD4 count.   Lung abscess/empyema - Currently on Day 6 of antimicrobial therapy and Day 5 of Unasyn. Cultures remain with no growth and he has remained afebrile with down trending WBC count. CVTS with no VATS currently and continue right pigtail cathter. Continue current dosage of Unasyn and monitor cultures.    Principal Problem:   Sepsis due to pneumonia [facility]) Active Problems:   History of substance abuse   HIV (+) Human immunodeficiency virus disease (HCC)   Tobacco abuse   Hyponatremia   Acute kidney injury (HCC)   Protein-calorie malnutrition, severe   . bictegravir-emtricitabine-tenofovir AF  1 tablet Oral Daily  . feeding supplement (ENSURE ENLIVE)  237 mL Oral TID BM  . folic acid  1 mg Oral Daily  . guaiFENesin  600 mg Oral BID  . metoprolol tartrate  12.5 mg Oral BID  . multivitamin with minerals  1 tablet Oral Daily  . nicotine  14 mg Transdermal Daily  . sodium chloride flush  5 mL Intracatheter Q8H  . thiamine  100 mg Oral Daily    SUBJECTIVE:  Afebrile overnight with down trending WBC count. Cultures remain with no growth to date. No surgical intervention at this time per CVTS.   States he is feeling better. Continues to experience a cough with symptoms that are worse at night. Denies fevers, chills or sweats.   No Known  Allergies   Review of Systems: Review of Systems  Constitutional: Negative for chills, diaphoresis and fever.  Respiratory: Positive for cough and shortness of breath. Negative for sputum production and wheezing.   Cardiovascular: Negative for chest pain.  Gastrointestinal: Negative for abdominal pain, constipation, diarrhea, nausea and vomiting.  Skin: Negative for rash.  Neurological: Negative for headaches.      OBJECTIVE: Vitals:   12/11/17 0548 12/11/17 0843 12/11/17 1430 12/11/17 2039  BP: (!) 127/95 (!) 136/100 (!) 145/102 (!) 134/92  Pulse: 100  81 92  Resp: (!) 37   (!) 37  Temp: (!) 97.4 F (36.3 C)  97.7 F (36.5 C) 97.9 F (36.6 C)  TempSrc: Oral  Axillary Oral  SpO2: 100%  100% 100%  Weight:      Height:       Body mass index is 18.42 kg/m.  Physical Exam  Constitutional: He is oriented to person, place, and time. He appears well-developed. He appears cachectic. He is cooperative. He appears ill. No distress.  Cardiovascular: Normal rate, regular rhythm, normal heart sounds and intact distal pulses.  Pulmonary/Chest: Breath sounds normal. No stridor. Tachypnea noted. No respiratory distress. He has no wheezes. He has no rhonchi. He has no rales.  Pig tail drain present and patent. There is serous drainage. Dressing is clean, dry and intact.   Neurological: He is alert and oriented to  person, place, and time.  Skin: Skin is warm and dry.  Psychiatric: He has a normal mood and affect. His behavior is normal. Judgment and thought content normal.    Lab Results Lab Results  Component Value Date   WBC 12.6 (H) 12/12/2017   HGB 10.1 (L) 12/12/2017   HCT 30.9 (L) 12/12/2017   MCV 99.7 12/12/2017   PLT 445 (H) 12/12/2017    Lab Results  Component Value Date   CREATININE 0.95 12/12/2017   BUN 41 (H) 12/12/2017   NA 143 12/12/2017   K 3.7 12/12/2017   CL 110 12/12/2017   CO2 23 12/12/2017    Lab Results  Component Value Date   ALT 538 (H) 12/10/2017    AST 2,053 (H) 12/10/2017   ALKPHOS 60 12/10/2017   BILITOT 1.0 12/10/2017     Microbiology: Recent Results (from the past 240 hour(s))  Urine culture     Status: Abnormal   Collection Time: 12/07/17  3:00 AM  Result Value Ref Range Status   Specimen Description URINE, RANDOM  Final   Special Requests   Final    NONE Performed at [facility], [address] [place], [place] [address]    Culture MULTIPLE SPECIES PRESENT, SUGGEST RECOLLECTION (A)  Final   Report Status 12/08/2017 FINAL  Final  Blood Culture (routine x 2)     Status: None (Preliminary result)   Collection Time: 12/07/17  3:32 AM  Result Value Ref Range Status   Specimen Description BLOOD RIGHT ANTECUBITAL  Final   Special Requests   Final    BOTTLES DRAWN AEROBIC AND ANAEROBIC Blood Culture adequate volume   Culture   Final    NO GROWTH 4 DAYS Performed at [facility], [address], [place], [place] [address]    Report Status PENDING  Incomplete  Blood Culture (routine x 2)     Status: None (Preliminary result)   Collection Time: 12/07/17  3:47 AM  Result Value Ref Range Status   Specimen Description BLOOD RIGHT WRIST  Final   Special Requests   Final    BOTTLES DRAWN AEROBIC AND ANAEROBIC Blood Culture adequate volume   Culture   Final    NO GROWTH 4 DAYS Performed at [facility], [address] [place], [place] [address]    Report Status PENDING  Incomplete  Culture, respiratory (NON-Expectorated)     Status: None   Collection Time: 12/07/17  6:00 AM  Result Value Ref Range Status   Specimen Description SPUTUM  Final   Special Requests NONE  Final   Gram Stain   Final    FEW WBC PRESENT, PREDOMINANTLY PMN ABUNDANT GRAM POSITIVE COCCI IN PAIRS MODERATE GRAM NEGATIVE RODS    Culture   Final    ABUNDANT HAEMOPHILUS INFLUENZAE BETA LACTAMASE NEGATIVE Performed at [facility], [address], [place], [place] [address]    Report Status 12/09/2017 FINAL  Final  Body  fluid culture     Status: None (Preliminary result)   Collection Time: 12/09/17  6:28 PM  Result Value Ref Range Status   Specimen Description FLUID RIGHT PLEURAL  Final   Special Requests NONE  Final   Gram Stain   Final    MODERATE WBC PRESENT, PREDOMINANTLY PMN NO ORGANISMS SEEN    Culture   Final    NO GROWTH 2 DAYS Performed at [facility], [address] [place], [place] [address]  Report Status PENDING  Incomplete     Kevin Eke, NP [facility] [phone] Pager  12/12/2017  9:15 AM

## 2017-12-12 NOTE — Progress Notes (Signed)
Patient Demographics:    Kevin Jimenez, is a 67 y.o. male, DOB - 1950-09-11, ION:[id number]  Admit date - 12/07/2017   Admitting Physician Briscoe Deutscher, MD  Outpatient Primary MD for the patient is Fleet Contras, MD  LOS - 5   Chief Complaint  Patient presents with  . 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 male with a PMH of HIV (elite controller) and chronic hepatitis C , noncompliant with Harvoni, hypertension, polysubstance abuse including heroin, tobacco abuse and history of spontaneous pneumothorax admitted on 12/07/2017 with respiratory symptoms and found to have right-sided pneumonia with parapneumonic effusion and empyema   Plan:- 1)HIV - ID consult appreciated, ?? Recent non-compliance, viral load is 240, CD4 count is 90 but this may be falsely low due to acute illness,  C/n  Biktarvy   2)Sepsis secondary to Rt sided PNA with Lung abscess and empyema (large right pleural effusion) - Respiratory  Panel from 12/07/2017 with haemophilus influenza, clinically slowly improving after s/p 14 F Right basilar pleural drainage catheter (Pigtail chest tube)- Keep to LWS at 20 cm water, 400 mL removed on 12/09/2017, about 1.4 Liters out as of 12/12/17 (only 140 ml in last 24 hrs), no hypoxia , CT surgery consult appreciated , they do not recommend  VATS for now,  Blood cx, urine culture and pleural fluid culture are all without growth so far ,  c/n IV Unasyn (started 12/07/17), infectious disease has discontinued iv Vancomycin.  Please note  the patient is immunocompromised given his HIV status with poor compliance with HIV regimen recently.  White count is down to 12k from a peak of 32K  3)FTT--- per ID physician patient's current weight is consistent with his previous weight in the ID clinic, c/n Remeron 15 mg qhs for appetite stimulation  4)HYponatremia--multifactorial, on admission sodium was 122, now resolved  5)AKI----acute kidney injury  -   resolved with hydration, creatinine on admission= 1.71  ,   baseline creatinine =  0.76   , creatinine is now= 0.95      ,  Avoid nephrotoxic agents/dehydration/hypotension  6)Hep C--apparently is noncompliant with Harvoni, elevated LFTs noted, query secondary to  Biktarvy    7)Paroxysmal Afib--- not a candidate for anticoagulation as patient may need further surgical intervention for his right chest empyema, he is  back in sinus rhythm now, c/n metoprolol 12.5 mg twice daily  Code Status : DNR  Disposition Plan  : home   Consults  :  ID/IR/CT surgery  Procedures- right-sided 14 F pigtail chest tube catheter (s/p Right basilar pleural drainage catheter) for right-sided empyema/abscess by IR  DVT Prophylaxis  :   Heparin - SCDs (hold off on heparin to allow for drainage of empyema)  Lab Results  Component Value Date   PLT 445 (H) 12/12/2017  Inpatient Medications  Scheduled Meds: . bictegravir-emtricitabine-tenofovir AF  1 tablet Oral Daily  . feeding supplement (ENSURE ENLIVE)  237 mL Oral TID BM  . folic acid  1 mg Oral Daily  . guaiFENesin  600 mg Oral BID  . metoprolol tartrate  12.5 mg Oral BID  . multivitamin with minerals  1 tablet Oral Daily  . nicotine  14 mg Transdermal Daily  . sodium chloride flush  5 mL Intracatheter Q8H  . thiamine  100 mg Oral Daily   Continuous Infusions: . ampicillin-sulbactam (UNASYN) IV 3 g (12/12/17 0600)  . dextrose 5 % and 0.9% NaCl 75 mL/hr at 12/12/17 1219   PRN Meds:.benzonatate, ibuprofen, labetalol, ondansetron **OR**  ondansetron (ZOFRAN) IV   Anti-infectives (From admission, onward)   Start     Dose/Rate Route Frequency Ordered Stop   12/08/17 1430  Ampicillin-Sulbactam (UNASYN) 3 g in sodium chloride 0.9 % 100 mL IVPB     3 g 200 mL/hr over 30 Minutes Intravenous Every 8 hours 12/08/17 0915     12/08/17 0630  azithromycin (ZITHROMAX) 500 mg in sodium chloride 0.9 % 250 mL IVPB  Status:  Discontinued     500 mg 250 mL/hr over 60 Minutes Intravenous Every 24 hours 12/07/17 1014 12/07/17 1442   12/08/17 0600  cefTRIAXone (ROCEPHIN) 2 g in sodium chloride 0.9 % 100 mL IVPB  Status:  Discontinued     2 g 200 mL/hr over 30 Minutes Intravenous Every 24 hours 12/07/17 1014 12/07/17 1442   12/08/17 0300  vancomycin (VANCOCIN) 500 mg in sodium chloride 0.9 % 100 mL IVPB  Status:  Discontinued     500 mg 100 mL/hr over 60 Minutes Intravenous Every 12 hours 12/07/17 1455 12/09/17 1423   12/07/17 1800  bictegravir-emtricitabine-tenofovir AF (BIKTARVY) 50-200-25 MG per tablet 1 tablet     1 tablet Oral Daily 12/07/17 1651     12/07/17 1500  vancomycin (VANCOCIN) IVPB 1000 mg/200 mL premix     1,000 mg 200 mL/hr over 60 Minutes Intravenous  Once 12/07/17 1455 12/07/17 1638   12/07/17 1500  piperacillin-tazobactam (ZOSYN) IVPB 3.375 g  Status:  Discontinued     3.375 g 12.5 mL/hr over 240 Minutes Intravenous Every 8 hours 12/07/17 1455 12/08/17 0915   12/07/17 0245  cefTRIAXone (ROCEPHIN) 2 g in sodium chloride 0.9 % 100 mL IVPB  Status:  Discontinued     2 g 200 mL/hr over 30 Minutes Intravenous Every 24 hours 12/07/17 0236 12/07/17 1007   12/07/17 0245  azithromycin (ZITHROMAX) 500 mg in sodium chloride 0.9 % 250 mL IVPB  Status:  Discontinued     500 mg 250 mL/hr over 60 Minutes Intravenous Every 24 hours 12/07/17 0236 12/07/17 1007        Objective:   Vitals:   12/11/17 0548 12/11/17 0843 12/11/17 1430 12/11/17 2039  BP: (!) 127/95 (!) 136/100 (!) 145/102 (!) 134/92  Pulse: 100  81 92  Resp: (!) 37    (!) 37  Temp: (!) 97.4 F (36.3 C)  97.7 F (36.5 C) 97.9 F (36.6 C)  TempSrc: Oral  Axillary Oral  SpO2: 100%  100% 100%  Weight:      Height:        Wt Readings from Last 3 Encounters:  12/10/17 59.9 kg (132 lb 0.9 oz)  04/04/17 52.2 kg (115 lb)  03/30/17 53.1 kg (117 lb)     Intake/Output Summary (Last 24 hours) at 12/12/2017 1341 Last data filed at 12/12/2017 0930  Gross per 24 hour  Intake 1816.41 ml  Output 1720 ml  Net 96.41 ml    Physical Exam  Gen:- Awake Alert, cachectic, able to speak in sentences HEENT:- [place].AT, No sclera icterus Neck-Supple Neck,No JVD,.  Lungs-right-sided pigtail chest tube, appears to be draining well CV- S1, S2 normal, regular Abd-  +ve B.Sounds, Abd Soft, No tenderness,    Extremity/Skin:- No  edema,   Good pulses  Psych-affect is appropriate, oriented x3 Neuro-no new focal deficits, no tremors   Data Review:   Micro Results Recent Results (from the past 240 hour(s))  Urine culture     Status: Abnormal   Collection Time: 12/07/17  3:00 AM  Result Value Ref Range Status   Specimen Description URINE, RANDOM  Final   Special Requests   Final    NONE Performed at [facility], [address], [place], [place] [address]    Culture MULTIPLE SPECIES PRESENT, SUGGEST RECOLLECTION (A)  Final   Report Status 12/08/2017 FINAL  Final  Blood Culture (routine x 2)     Status: None (Preliminary result)   Collection Time: 12/07/17  3:32 AM  Result Value Ref Range Status   Specimen Description BLOOD RIGHT ANTECUBITAL  Final   Special Requests   Final    BOTTLES DRAWN AEROBIC AND ANAEROBIC Blood Culture adequate volume   Culture   Final    NO GROWTH 4 DAYS Performed at [facility], [address], [place], [place] [address]    Report Status PENDING  Incomplete  Blood Culture (routine x 2)     Status: None (Preliminary result)   Collection Time: 12/07/17  3:47 AM  Result Value Ref Range Status   Specimen Description BLOOD  RIGHT WRIST  Final   Special Requests   Final    BOTTLES DRAWN AEROBIC AND ANAEROBIC Blood Culture adequate volume   Culture   Final    NO GROWTH 4 DAYS Performed at [facility], [address], [place], [place] [address]    Report Status PENDING  Incomplete  Culture, respiratory (NON-Expectorated)     Status: None   Collection Time: 12/07/17  6:00 AM  Result Value Ref Range Status   Specimen Description SPUTUM  Final   Special Requests NONE  Final   Gram Stain   Final    FEW WBC PRESENT, PREDOMINANTLY PMN ABUNDANT GRAM POSITIVE COCCI IN PAIRS MODERATE GRAM NEGATIVE RODS    Culture   Final    ABUNDANT HAEMOPHILUS INFLUENZAE BETA LACTAMASE NEGATIVE Performed at [facility], [address] [place], [place] [address]    Report Status 12/09/2017 FINAL  Final  Body fluid culture     Status: None (Preliminary result)   Collection Time: 12/09/17  6:28 PM  Result Value Ref Range Status   Specimen Description FLUID RIGHT PLEURAL  Final   Special Requests NONE  Final   Gram Stain   Final    MODERATE WBC PRESENT, PREDOMINANTLY PMN NO ORGANISMS SEEN    Culture   Final    NO GROWTH 3 DAYS Performed at [facility], [address] [place], [place] [address]    Report Status PENDING  Incomplete    Radiology Reports Dg Chest 2 View  Result Date: 12/07/2017 CLINICAL DATA:  67 year old male with shortness of breath and cough. EXAM: CHEST - 2 VIEW COMPARISON:  Chest radiograph dated 04/04/2017 FINDINGS: Large area of consolidative change at the right lung base abutting the  lateral pleural surface. Although this may represent pneumonia a mass is not excluded. Clinical correlation and follow-up to resolution recommended. There is a small right pleural effusion. The left lung is clear. No pneumothorax. The cardiac silhouette is within normal limits. No acute osseous pathology. IMPRESSION: Right lower lobe consolidation may represent pneumonia. Clinical correlation and  follow-up to resolution recommended to exclude an underlying mass. Trace right pleural effusion. Electronically Signed   By: Elgie Collard M.D.   On: 12/07/2017 02:25   Ct Head Wo Contrast  Result Date: 12/07/2017 CLINICAL DATA:  67 year old male with shortness of breath and altered mental status. EXAM: CT HEAD WITHOUT CONTRAST TECHNIQUE: Contiguous axial images were obtained from the base of the skull through the vertex without intravenous contrast. COMPARISON:  None. FINDINGS: Brain: The ventricles and sulci appropriate size for patient's age. Mild periventricular and deep white matter chronic microvascular ischemic changes noted. 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 There is no acute intracranial hemorrhage. No mass effect or midline shift. No extra-axial fluid collection. Vascular: No hyperdense vessel or unexpected calcification. Skull: Normal. Negative for fracture or focal lesion. Sinuses/Orbits: Mild diffuse mucoperiosteal thickening of paranasal sinuses. No air-fluid levels. The mastoid air cells are clear. Other: None IMPRESSION: 1. No acute intracranial pathology. 2. Mild chronic microvascular ischemic changes. Electronically Signed   By: Elgie Collard M.D.   On: 12/07/2017 02:23   Ct Chest W Contrast  Result Date: 12/07/2017 CLINICAL DATA:  Cough and shortness of breath, HIV. Unintentional weight loss. EXAM: CT CHEST WITH CONTRAST TECHNIQUE: Multidetector CT imaging of the chest was performed during intravenous contrast administration. CONTRAST:  OMNIPAQUE IOHEXOL 300 MG/ML  SOLN COMPARISON:  Chest radiographs 12/07/2017 and CT chest 03/30/2017. FINDINGS: Cardiovascular: Atherosclerotic calcification of the arterial vasculature, including coronary arteries. Heart is at the upper limits normal in size to mildly enlarged. No pericardial effusion. Mediastinum/Nodes: Mediastinal lymph nodes measure up to 9 mm in the low right paratracheal  station. Hilar lymph nodes measure up to 1.4 cm on the right. No axillary adenopathy. Esophagus is grossly unremarkable. Lungs/Pleura: Right lower lobe consolidation with areas of decreased attenuation, air and air-fluid levels. Highly loculated large right pleural effusion. Centrilobular and paraseptal emphysema. Subpleural ground-glass in the posterior left upper lobe, likely infectious or inflammatory in etiology. Small left pleural effusion, simple in appearance, with minimal compressive atelectasis in the left lower lobe. Upper Abdomen: Visualized portion of the liver is mildly heterogeneous, which may be due to arterial phase imaging. There are 2 areas of hyper attenuation within the right hepatic lobe, measuring up to 1.6 cm visualized portions of the adrenal glands, kidneys, spleen, pancreas, stomach and bowel are grossly unremarkable. Upper abdominal lymph nodes are not enlarged by CT size criteria. Musculoskeletal: No worrisome lytic or sclerotic lesions. IMPRESSION: 1. Right lower lobe consolidation is likely due to pneumonia. Internal fluid, air and air-fluid levels indicate associated necrosis/abscess formation. An underlying mass cannot be definitively excluded. Follow-up to clearing is recommended. 2. Large loculated right pleural effusion is indicative of an empyema. 3. Right hilar adenopathy, likely reactive. This can also be re-evaluated on follow-up imaging. 4. Small left pleural effusion. 5. Hyperattenuating lesions in the right hepatic lobe are difficult to further characterize but may represent perfusion anomalies or flash fill hemangiomas. If further evaluation is desired, MR abdomen without and with contrast is recommended. 6. Aortic atherosclerosis (ICD10-170.0). Coronary artery calcification. 7.  Emphysema (ICD10-J43.9). Electronically Signed   By: Juliette Alcide  Blietz M.D.   On: 12/07/2017 12:57   Dg Chest Port 1 View  Result Date: 12/12/2017 CLINICAL DATA:  Dyspnea, pleural effusions,  right-sided chest tube. EXAM: PORTABLE CHEST 1 VIEW COMPARISON:  Portable chest x-ray of December 11, 2017 FINDINGS: Confluent airspace opacity persists in the right mid and lower lung. There is a moderate amount of pleural fluid on the right which is stable. The right pigtail pleural drainage catheter is in stable position inferiorly. On the left the lung is better inflated. There is left basilar atelectasis. There is a small left pleural effusion. The heart is top-normal in size. The pulmonary vascularity is normal. There is calcification in the wall of the aortic arch. The bony thorax is unremarkable. IMPRESSION: Persistent right basilar atelectasis and/or pneumonia with moderate-sized right pleural effusion. Stable positioning of the pigtail catheter on the right. No pneumothorax. Stable small left pleural effusion and basilar atelectasis. Thoracic aortic atherosclerosis. Electronically Signed   By: David  [place] M.D.   On: 12/12/2017 07:49   Dg Chest Port 1 View  Result Date: 12/11/2017 CLINICAL DATA:  Shortness of breath. EXAM: PORTABLE CHEST 1 VIEW COMPARISON:  12/10/2017 FINDINGS: Pigtail drainage catheter over right lung base unchanged. No evidence pneumothorax. Lungs are adequately inflated with persistent opacification over the right mid to lower lung unchanged likely right-sided effusion with atelectasis. Possible very small amount of left pleural fluid unchanged. Cardiomediastinal silhouette and remainder the exam is unchanged. IMPRESSION: Stable opacification over the right mid to lower lung likely effusion with atelectasis. Pigtail drainage catheter over the right base unchanged. No pneumothorax. Tiny stable amount left pleural fluid. Electronically Signed   By: Elberta Fortis M.D.   On: 12/11/2017 10:51   Dg Chest Port 1 View  Result Date: 12/10/2017 CLINICAL DATA:  Empyema.  Status post drainage catheter placement. EXAM: PORTABLE CHEST 1 VIEW COMPARISON:  CT scan 12/07/2017 and chest x-ray, same  date. FINDINGS: The cardiac silhouette, mediastinal and hilar contours are within normal limits and stable. The left lung remains clear. There is a pigtail catheter noted at the right lung base which was placed into the empyema cavity. The loculated right basilar fluid collection is smaller. There is surrounding pneumonia and atelectasis which also appears slightly improved. IMPRESSION: Right basilar pleural drainage catheter in good position with decreased loculated right pleural fluid collection/empyema. Persistent right lower lobe pneumonia and atelectasis but it may be slightly improved. The left lung remains clear. Electronically Signed   By: Rudie Meyer M.D.   On: 12/10/2017 08:40   Ct Image Guided Drainage By Percutaneous Catheter  Result Date: 12/10/2017 INDICATION: Sepsis, pneumonia, HIV, right parapneumonic effusion versus empyema EXAM: CT-GUIDED 14 FRENCH RIGHT CHEST TUBE INSERTION MEDICATIONS: The patient is currently admitted to the hospital and receiving intravenous antibiotics. The antibiotics were administered within an appropriate time frame prior to the initiation of the procedure. ANESTHESIA/SEDATION: 2.0 mg IV Versed 100 mcg IV Fentanyl Moderate Sedation Time:  20 MINUTES The patient was continuously monitored during the procedure by the interventional radiology nurse under my direct supervision. COMPLICATIONS: None immediate. TECHNIQUE: Informed written consent was obtained from the patient after a thorough discussion of the procedural risks, benefits and alternatives. All questions were addressed. Maximal Sterile Barrier Technique was utilized including caps, mask, sterile gowns, sterile gloves, sterile drape, hand hygiene and skin antiseptic. A timeout was performed prior to the initiation of the procedure. PROCEDURE: The right lateral chest was prepped with ChloraPrep in a sterile fashion, and a sterile drape was applied  covering the operative field. A sterile gown and sterile gloves  were used for the procedure. Local anesthesia was provided with 1% Lidocaine. Previous imaging reviewed. Patient positioned supine. Noncontrast localization CT performed. The large loculated right pleural effusion was localized. A right lateral approach mid axillary line was marked. Under sterile conditions and local anesthesia, an 18 gauge 10 cm access needle was advanced percutaneously right lateral axillary approach into the pleural effusion. There was return of exudative fluid. Amplatz guidewire inserted followed by tract dilatation to insert a 14 French drain. Drain catheter position confirmed with CT. Chest tube connected to pleura vac. 400 cc removed. Sample sent for culture. Catheter secured with a Prolene suture and a sterile dressing. No immediate complication. Patient tolerated the procedure well. FINDINGS: Imaging confirms needle placed in the right loculated effusion for drain insertion IMPRESSION: Successful CT-guided 14 French right chest tube insertion Electronically Signed   By: Judie Petit.  Shick M.D.   On: 12/10/2017 08:17     CBC Recent Labs  Lab 12/07/17 0125 12/08/17 0345 12/10/17 0350 12/12/17 0701  WBC 27.4* 32.0* 21.9* 12.6*  HGB 10.4* 9.9* 11.2* 10.1*  HCT 30.3* 28.3* 33.2* 30.9*  PLT 541* 509* 592* 445*  MCV 95.3 92.8 95.4 99.7  MCH 32.7 32.5 32.2 32.6  MCHC 34.3 35.0 33.7 32.7  RDW 11.3* 11.4* 12.1 12.5  LYMPHSABS 1.1  --   --   --   MONOABS 0.0*  --   --   --   EOSABS 0.0  --   --   --   BASOSABS 0.0  --   --   --     Chemistries  Recent Labs  Lab 12/07/17 0125 12/07/17 0955 12/09/17 0506 12/10/17 0350 12/12/17 0701  NA 122* 128* 132* 136 143  K 4.0 4.0 3.8 4.5 3.7  CL 90* 100 98 100 110  CO2 19* 18* 21* 21* 23  GLUCOSE 122* 131* 101* 74 107*  BUN 43* 35* 26* 52* 41*  CREATININE 1.71* 1.13 0.85 1.37* 0.95  CALCIUM 8.2* 7.6* 8.2* 8.3* 7.8*  AST 55*  --   --  2,053*  --   ALT 25  --   --  538*  --   ALKPHOS 42  --   --  60  --   BILITOT 1.1  --   --  1.0   --    ------------------------------------------------------------------------------------------------------------------ No results for input(s): CHOL, HDL, LDLCALC, TRIG, CHOLHDL, LDLDIRECT in the last 72 hours.  No results found for: HGBA1C ------------------------------------------------------------------------------------------------------------------ No results for input(s): TSH, T4TOTAL, T3FREE, THYROIDAB in the last 72 hours.  Invalid input(s): FREET3 ------------------------------------------------------------------------------------------------------------------ No results for input(s): VITAMINB12, FOLATE, FERRITIN, TIBC, IRON, RETICCTPCT in the last 72 hours.  Coagulation profile Recent Labs  Lab 12/07/17 1128  INR 1.26    No results for input(s): DDIMER in the last 72 hours.  Cardiac Enzymes No results for input(s): CKMB, TROPONINI, MYOGLOBIN in the last 168 hours.  Invalid input(s): CK ------------------------------------------------------------------------------------------------------------------ No results found for: BNP   Shon Hale M.D on 12/12/2017 at 1:41 PM   Go to [email or web address] - password [email or web address] for contact info  [facility] - Office  [phone]

## 2017-12-12 NOTE — Evaluation (Signed)
Physical Therapy Evaluation Patient Details Name: Kevin Jimenez MRN: [id number] DOB: 1950-06-16 Today's Date: 12/12/2017   History of Present Illness  67 y.o.malewith a PMH of HIV (elite controller) and chronic hepatitis C , noncompliant with Harvoni, hypertension, polysubstance abuse including heroin, tobacco abuse and history of spontaneous pneumothorax admitted on 12/07/2017 with respiratory symptoms and found to have right-sided pneumonia with parapneumonic effusion and empyema .  Chest tube in place.  Clinical Impression  Pt admitted with above diagnosis. Pt currently with functional limitations due to the deficits listed below (see PT Problem List). Pt was unable to tolerate more than sitting EOB.  Pt mod assist and cues with heavy posterior lean in standing.  Significantly debilitated.  Will need SNF or 24 hour care at home.   Pt will benefit from skilled PT to increase their independence and safety with mobility to allow discharge to the venue listed below.      Follow Up Recommendations SNF;Supervision/Assistance - 24 hour    Equipment Recommendations  Rolling walker with 5" wheels;3in1 (PT)    Recommendations for Other Services       Precautions / Restrictions Precautions Precautions: Fall Precaution Comments: chest tube Restrictions Weight Bearing Restrictions: No      Mobility  Bed Mobility Overal bed mobility: Needs Assistance Bed Mobility: Supine to Sit;Sit to Supine     Supine to sit: Min assist Sit to supine: Min guard   General bed mobility comments: Needed some assist to come to EOB for elevation of trunk.   Transfers Overall transfer level: Needs assistance Equipment used: 2 person hand held assist Transfers: Sit to/from Stand Sit to Stand: Mod assist;From elevated surface         General transfer comment: Pt needed mod assist to stand to power up and mod assist once up due to heavy posterior lean.  Took 2 side steps up to [facility] with incr assist with  stabilitiy.   Ambulation/Gait             General Gait Details: unable today  Stairs            Wheelchair Mobility    Modified Rankin (Stroke Patients Only)       Balance Overall balance assessment: Needs assistance         Standing balance support: Bilateral upper extremity supported;During functional activity Standing balance-Leahy Scale: Poor Standing balance comment: mod assist for standing with legs against bed and heavy posterior lean even with UE support bilaterally                              Pertinent Vitals/Pain Pain Assessment: 0-10 Pain Score: 5  Pain Location: (back) Pain Descriptors / Indicators: Aching;Grimacing;Guarding Pain Intervention(s): Limited activity within patient's tolerance;Monitored during session;Repositioned   VSS Home Living Family/patient expects to be discharged to:: Private residence Living Arrangements: Parent Available Help at Discharge: Family;Personal care attendant;Available PRN/intermittently(aide 2 hrs/day, M-Sun- housekeeping, cooking; mom intermittn) Type of Home: House Home Access: Stairs to enter Entrance Stairs-Rails: Right Entrance Stairs-Number of Steps: 9 Home Layout: Two level;Bed/bath upstairs Home Equipment: Shower seat;Cane - single point      Prior Function Level of Independence: Independent with assistive device(s)         Comments: used cane and bathed and dresed himself     Hand Dominance        Extremity/Trunk Assessment   Upper Extremity Assessment Upper Extremity Assessment: Defer to OT evaluation  Lower Extremity Assessment Lower Extremity Assessment: RLE deficits/detail;LLE deficits/detail RLE Deficits / Details: grossly 3-/5 LLE Deficits / Details: grossly 3-/5    Cervical / Trunk Assessment Cervical / Trunk Assessment: Kyphotic  Communication   Communication: No difficulties  Cognition Arousal/Alertness: Awake/alert Behavior During Therapy: WFL for tasks  assessed/performed Overall Cognitive Status: Within Functional Limits for tasks assessed                                        General Comments      Exercises     Assessment/Plan    PT Assessment Patient needs continued PT services  PT Problem List Decreased mobility;Decreased strength;Decreased activity tolerance;Decreased balance;Decreased knowledge of use of DME;Decreased safety awareness       PT Treatment Interventions DME instruction;Gait training;Functional mobility training;Therapeutic activities;Therapeutic exercise;Balance training;Patient/family education;Stair training    PT Goals (Current goals can be found in the Care Plan section)  Acute Rehab PT Goals Patient Stated Goal: to get stronger PT Goal Formulation: With patient Time For Goal Achievement: 12/26/17 Potential to Achieve Goals: Good    Frequency Min 3X/week   Barriers to discharge Decreased caregiver support has mom and aide but not 24 hour care.  Has 18 steps to bedrooms    Co-evaluation               AM-PAC PT "6 Clicks" Daily Activity  Outcome Measure Difficulty turning over in bed (including adjusting bedclothes, sheets and blankets)?: Unable Difficulty moving from lying on back to sitting on the side of the bed? : Unable Difficulty sitting down on and standing up from a chair with arms (e.g., wheelchair, bedside commode, etc,.)?: Unable Help needed moving to and from a bed to chair (including a wheelchair)?: Total Help needed walking in hospital room?: Total Help needed climbing 3-5 steps with a railing? : Total 6 Click Score: 6    End of Session Equipment Utilized During Treatment: Gait belt;Oxygen Activity Tolerance: Patient limited by fatigue Patient left: in bed;with call bell/phone within reach;with bed alarm set Nurse Communication: Mobility status PT Visit Diagnosis: Unsteadiness on feet (R26.81)    Time: 3716-9678 PT Time Calculation (min) (ACUTE ONLY): 20  min   Charges:   PT Evaluation $PT Eval Moderate Complexity: 1 Mod     PT G Codes:        Chellsea Beckers,PT Acute Rehabilitation [phone] (pager)   Berline Lopes 12/12/2017, 5:16 PM

## 2017-12-13 DIAGNOSIS — Z8619 Personal history of other infectious and parasitic diseases: Secondary | ICD-10-CM

## 2017-12-13 DIAGNOSIS — F1911 Other psychoactive substance abuse, in remission: Secondary | ICD-10-CM

## 2017-12-13 LAB — BODY FLUID CULTURE: CULTURE: NO GROWTH

## 2017-12-13 LAB — COMPREHENSIVE METABOLIC PANEL
ALBUMIN: 1.6 g/dL — AB (ref 3.5–5.0)
ALT: 726 U/L — AB (ref 0–44)
AST: 868 U/L — AB (ref 15–41)
Alkaline Phosphatase: 92 U/L (ref 38–126)
Anion gap: 6 (ref 5–15)
BUN: 17 mg/dL (ref 8–23)
CALCIUM: 7.6 mg/dL — AB (ref 8.9–10.3)
CO2: 24 mmol/L (ref 22–32)
CREATININE: 0.79 mg/dL (ref 0.61–1.24)
Chloride: 111 mmol/L (ref 98–111)
GFR calc Af Amer: >60 mL/min (ref >60)
GFR calc non Af Amer: >60 mL/min (ref >60)
GLUCOSE: 91 mg/dL (ref 70–99)
Potassium: 3.8 mmol/L (ref 3.5–5.1)
SODIUM: 141 mmol/L (ref 135–145)
Total Bilirubin: 0.7 mg/dL (ref 0.3–1.2)
Total Protein: 5.6 g/dL — ABNORMAL LOW (ref 6.5–8.1)

## 2017-12-13 MED ORDER — HYDRALAZINE HCL 20 MG/ML IJ SOLN: 10.0000 mg | Freq: Four times a day (QID) | INTRAMUSCULAR | Status: DC | PRN

## 2017-12-13 NOTE — Progress Notes (Signed)
  Subjective: Pigtail drain 150 cc yesterday- leave until drainage < 50 cc per day.No growth on pleural fluid  Patient not candidate for VATS due to debility and nonischemic cardiomyopathy prob HIV related  Cont antibiotics and IR drain  Objective: Vital signs in last 24 hours: Temp:  [97.6 F (36.4 C)-98.4 F (36.9 C)] 97.6 F (36.4 C) (07/23 0500) Pulse Rate:  [46-85] 46 (07/23 0500) Cardiac Rhythm: Normal sinus rhythm (07/22 2000) Resp:  [20-33] 30 (07/23 0500) BP: (115-122)/(78-89) 121/85 (07/23 0500) SpO2:  [100 %] 100 % (07/23 0500) Weight:  [130 lb 15.3 oz (59.4 kg)] 130 lb 15.3 oz (59.4 kg) (07/23 0500)  Hemodynamic parameters for last 24 hours:    Intake/Output from previous day: 07/22 0701 - 07/23 0700 In: 3550.9 [P.O.:600; I.V.:2550.9; IV Piggyback:400] Out: 1390 [Urine:1200; Chest Tube:190] Intake/Output this shift: No intake/output data recorded.    Lab Results: Recent Labs    12/12/17 0701  WBC 12.6*  HGB 10.1*  HCT 30.9*  PLT 445*   BMET:  Recent Labs    12/12/17 0701  NA 143  K 3.7  CL 110  CO2 23  GLUCOSE 107*  BUN 41*  CREATININE 0.95  CALCIUM 7.8*    PT/INR: No results for input(s): LABPROT, INR in the last 72 hours. ABG    Component Value Date/Time   PHART 7.405 12/10/2017 0610   HCO3 19.0 (L) 12/10/2017 0610   ACIDBASEDEF 4.9 (H) 12/10/2017 0610   O2SAT 96.2 12/10/2017 0610   CBG (last 3)  No results for input(s): GLUCAP in the last 72 hours.  Assessment/Plan: S/P  Medical therapy for R intra-thoracic infection, + HIV   LOS: 6 days    Kathlee Nations Trigt III 12/13/2017

## 2017-12-13 NOTE — NC FL2 (Signed)
[place] MEDICAID FL2 LEVEL OF CARE SCREENING TOOL     IDENTIFICATION  Patient Name: Kevin Jimenez Birthdate: 1950-11-22 Sex: male Admission Date (Current Location): 12/07/2017  [facility] and [place] Number:  Producer, television/film/video and Address:  The . [facility], [address] [place], [place] [address]      Provider Number: [id number]  Attending Physician Name and Address:  Shon Hale, MD  Relative Name and Phone Number:  Lady Saucier, mother, [phone]    Current Level of Care: Hospital Recommended Level of Care: Skilled Nursing Facility Prior Approval Number:    Date Approved/Denied:   PASRR Number: [id number]  Discharge Plan: SNF    Current Diagnoses: Patient Active Problem List   Diagnosis Date Noted  . Protein-calorie malnutrition, severe 12/09/2017  . Sepsis due to pneumonia (HCC) 12/07/2017  . Hyponatremia 12/07/2017  . Acute kidney injury (HCC) 12/07/2017  . Tobacco abuse 01/22/2016  . Screening examination for venereal disease 01/21/2016  . Abnormal drug screen 08/30/2015  . At risk for abuse of opiates 08/30/2015  . Misuse of prescription only drugs (HCC) 08/30/2015  . Long term current use of opiate analgesic 08/04/2015  . Long term prescription opiate use 08/04/2015  . Encounter for therapeutic drug level monitoring 08/04/2015  . Osteoarthritis of hip (Location of Primary Source of Pain) (Right) 08/04/2015  . Liver fibrosis 07/15/2015  . HIV (+) Human immunodeficiency virus disease (HCC) 04/15/2015  . Chronic hepatitis C without hepatic coma (HCC) 04/15/2015  . Chronic hip pain (Location of Primary Source of Pain) (Right) 04/08/2015  . History of substance abuse 03/31/2015  . Hepatitis B core antibody positive 03/31/2015    Orientation RESPIRATION BLADDER Height & Weight     Self, Time, Situation, Place  O2(nasal cannula 2L) Continent Weight: 130 lb 15.3 oz (59.4 kg) Height:  5\' 11"  (180.3 cm)  BEHAVIORAL  SYMPTOMS/MOOD NEUROLOGICAL BOWEL NUTRITION STATUS      Continent Diet(please see DC summary)  AMBULATORY STATUS COMMUNICATION OF NEEDS Skin   Extensive Assist Verbally Normal                       Personal Care Assistance Level of Assistance  Bathing, Feeding, Dressing Bathing Assistance: Maximum assistance Feeding assistance: Independent Dressing Assistance: Maximum assistance     Functional Limitations Info  Sight, Hearing, Speech Sight Info: Adequate Hearing Info: Adequate Speech Info: Adequate    SPECIAL CARE FACTORS FREQUENCY  PT (By licensed PT), OT (By licensed OT)     PT Frequency: 5x/week OT Frequency: 5x/week            Contractures Contractures Info: Not present    Additional Factors Info  Code Status, Allergies Code Status Info: DNR Allergies Info: No Known Allergies           Current Medications (12/13/2017):  This is the current hospital active medication list Current Facility-Administered Medications  Medication Dose Route Frequency Provider Last Rate Last Dose  . Ampicillin-Sulbactam (UNASYN) 3 g in sodium chloride 0.9 % 100 mL IVPB  3 g Intravenous Q8H 12/15/2017, MD 200 mL/hr at 12/13/17 0618 3 g at 12/13/17 0618  . benzonatate (TESSALON) capsule 100 mg  100 mg Oral TID PRN 12/15/17, MD   100 mg at 12/08/17 1420  . bictegravir-emtricitabine-tenofovir AF (BIKTARVY) 50-200-25 MG per tablet 1 tablet  1 tablet Oral Daily 12/10/17, NP   1 tablet at 12/13/17 1025  . dextrose 5 %-0.9 % sodium chloride  infusion   Intravenous Continuous Kerin Perna, MD   Stopped at 12/13/17 [phone]  . feeding supplement (BOOST / RESOURCE BREEZE) liquid 1 Container  1 Container Oral BID BM Shon Hale, MD   1 Container at 12/13/17 1026  . feeding supplement (ENSURE ENLIVE) (ENSURE ENLIVE) liquid 237 mL  237 mL Oral BID BM Emokpae, Courage, MD      . folic acid (FOLVITE) tablet 1 mg  1 mg Oral Daily Emokpae, Courage, MD   1 mg at 12/13/17 1025   . guaiFENesin (MUCINEX) 12 hr tablet 600 mg  600 mg Oral BID Jonah Blue, MD   600 mg at 12/13/17 1025  . ibuprofen (ADVIL,MOTRIN) tablet 400 mg  400 mg Oral Q6H PRN Webb Silversmith, NP   400 mg at 12/12/17 2123  . labetalol (NORMODYNE,TRANDATE) injection 10 mg  10 mg Intravenous Q4H PRN Shon Hale, MD   10 mg at 12/10/17 2132  . metoprolol tartrate (LOPRESSOR) tablet 12.5 mg  12.5 mg Oral BID Emokpae, Courage, MD   12.5 mg at 12/13/17 1025  . multivitamin with minerals tablet 1 tablet  1 tablet Oral Daily Shon Hale, MD   1 tablet at 12/12/17 2123  . nicotine (NICODERM CQ - dosed in mg/24 hours) patch 14 mg  14 mg Transdermal Daily Webb Silversmith, NP   14 mg at 12/13/17 1026  . ondansetron (ZOFRAN) tablet 4 mg  4 mg Oral Q6H PRN Webb Silversmith, NP       Or  . ondansetron [facility] Rhonda W) injection 4 mg  4 mg Intravenous Q6H PRN Webb Silversmith, NP   4 mg at 12/12/17 1229  . sodium chloride flush (NS) 0.9 % injection 5 mL  5 mL Intracatheter Q8H Berdine Dance, MD   5 mL at 12/12/17 1446  . thiamine (VITAMIN B-1) tablet 100 mg  100 mg Oral Daily Emokpae, Courage, MD   100 mg at 12/13/17 1025     Discharge Medications: Please see discharge summary for a list of discharge medications.  Relevant Imaging Results:  Relevant Lab Results:   Additional Information SSN: [id number]  Abigail Butts, LCSW

## 2017-12-13 NOTE — Progress Notes (Signed)
Referring Physician(s): Dr Mariea Clonts  Supervising Physician: Malachy Moan  Patient Status:  Kevin Jimenez  Chief Complaint:  Rt empyema drain placed 7/19  Subjective:  Hep C; HIV; HTN; polysubstance abuse; smoker Chest tube drain placed Right empyema  Better daily Up in bed eating reg diet CXR yesterday: IMPRESSION: Persistent right basilar atelectasis and/or pneumonia with moderate-sized right pleural effusion. Stable positioning of the pigtail catheter on the right. No pneumothorax. Stable small left pleural effusion and basilar atelectasis.  1400 cc serous fluid in Pleurvac No airleak  Allergies: Patient has no known allergies.  Medications: Prior to Admission medications   Medication Sig Start Date End Date Taking? Authorizing Provider  lisinopril-hydrochlorothiazide (PRINZIDE,ZESTORETIC) 10-12.5 MG tablet Take 1 tablet daily by mouth. 02/04/17  Yes [provider]  meloxicam (MOBIC) 15 MG tablet Take 1 tablet (15 mg total) by mouth daily. 08/04/15  Yes Delano Metz, MD  VENTOLIN HFA 108 [phone] Base) MCG/ACT inhaler Take 2 puffs 4 (four) times daily as needed by mouth for shortness of breath. 02/03/17  Yes [provider]  emtricitabine-tenofovir AF (DESCOVY) 200-25 MG tablet Take 1 tablet by mouth daily. Patient not taking: Reported on 12/07/2017 07/16/15   Gardiner Barefoot, MD  gabapentin (NEURONTIN) 100 MG capsule Take 1 capsule (100 mg total) by mouth every 8 (eight) hours. Patient not taking: Reported on 12/07/2017 08/04/15   Delano Metz, MD  Ledipasvir-Sofosbuvir (HARVONI) 90-400 MG TABS Take 1 tablet by mouth daily. Patient not taking: Reported on 12/07/2017 01/21/16   Gardiner Barefoot, MD     Vital Signs: BP 121/85 (BP Location: Right Arm)   Pulse (!) 46   Temp 97.6 F (36.4 C) (Oral)   Resp (!) 30   Ht 5\' 11"  (1.803 m)   Wt 130 lb 15.3 oz (59.4 kg)   SpO2 100%   BMI 18.26 kg/m   Physical Exam  Constitutional: He is  oriented to person, place, and time.  Pulmonary/Chest: Effort normal and breath sounds normal.  Abdominal: Soft.  Neurological: He is alert and oriented to person, place, and time.  Skin: Skin is warm and dry.  Site of right CT is clean and dry NT no bleeding 1400 cc serous fluid in Pleurvac No airleak  CXR improving  Nursing note and vitals reviewed.   Imaging: Dg Chest Port 1 View  Result Date: 12/12/2017 CLINICAL DATA:  Dyspnea, pleural effusions, right-sided chest tube. EXAM: PORTABLE CHEST 1 VIEW COMPARISON:  Portable chest x-ray of December 11, 2017 FINDINGS: Confluent airspace opacity persists in the right mid and lower lung. There is a moderate amount of pleural fluid on the right which is stable. The right pigtail pleural drainage catheter is in stable position inferiorly. On the left the lung is better inflated. There is left basilar atelectasis. There is a small left pleural effusion. The heart is top-normal in size. The pulmonary vascularity is normal. There is calcification in the wall of the aortic arch. The bony thorax is unremarkable. IMPRESSION: Persistent right basilar atelectasis and/or pneumonia with moderate-sized right pleural effusion. Stable positioning of the pigtail catheter on the right. No pneumothorax. Stable small left pleural effusion and basilar atelectasis. Thoracic aortic atherosclerosis. Electronically Signed   By: David  December 13, 2017 M.D.   On: 12/12/2017 07:49   Dg Chest Port 1 View  Result Date: 12/11/2017 CLINICAL DATA:  Shortness of breath. EXAM: PORTABLE CHEST 1 VIEW COMPARISON:  12/10/2017 FINDINGS: Pigtail drainage catheter over right lung base unchanged. No evidence pneumothorax. Lungs  are adequately inflated with persistent opacification over the right mid to lower lung unchanged likely right-sided effusion with atelectasis. Possible very small amount of left pleural fluid unchanged. Cardiomediastinal silhouette and remainder the exam is unchanged. IMPRESSION:  Stable opacification over the right mid to lower lung likely effusion with atelectasis. Pigtail drainage catheter over the right base unchanged. No pneumothorax. Tiny stable amount left pleural fluid. Electronically Signed   By: Elberta Fortis M.D.   On: 12/11/2017 10:51   Dg Chest Port 1 View  Result Date: 12/10/2017 CLINICAL DATA:  Empyema.  Status post drainage catheter placement. EXAM: PORTABLE CHEST 1 VIEW COMPARISON:  CT scan 12/07/2017 and chest x-ray, same date. FINDINGS: The cardiac silhouette, mediastinal and hilar contours are within normal limits and stable. The left lung remains clear. There is a pigtail catheter noted at the right lung base which was placed into the empyema cavity. The loculated right basilar fluid collection is smaller. There is surrounding pneumonia and atelectasis which also appears slightly improved. IMPRESSION: Right basilar pleural drainage catheter in good position with decreased loculated right pleural fluid collection/empyema. Persistent right lower lobe pneumonia and atelectasis but it may be slightly improved. The left lung remains clear. Electronically Signed   By: Rudie Meyer M.D.   On: 12/10/2017 08:40   Ct Image Guided Drainage By Percutaneous Catheter  Result Date: 12/10/2017 INDICATION: Sepsis, pneumonia, HIV, right parapneumonic effusion versus empyema EXAM: CT-GUIDED 14 FRENCH RIGHT CHEST TUBE INSERTION MEDICATIONS: The patient is currently admitted to the hospital and receiving intravenous antibiotics. The antibiotics were administered within an appropriate time frame prior to the initiation of the procedure. ANESTHESIA/SEDATION: 2.0 mg IV Versed 100 mcg IV Fentanyl Moderate Sedation Time:  20 MINUTES The patient was continuously monitored during the procedure by the interventional radiology nurse under my direct supervision. COMPLICATIONS: None immediate. TECHNIQUE: Informed written consent was obtained from the patient after a thorough discussion of the  procedural risks, benefits and alternatives. All questions were addressed. Maximal Sterile Barrier Technique was utilized including caps, mask, sterile gowns, sterile gloves, sterile drape, hand hygiene and skin antiseptic. A timeout was performed prior to the initiation of the procedure. PROCEDURE: The right lateral chest was prepped with ChloraPrep in a sterile fashion, and a sterile drape was applied covering the operative field. A sterile gown and sterile gloves were used for the procedure. Local anesthesia was provided with 1% Lidocaine. Previous imaging reviewed. Patient positioned supine. Noncontrast localization CT performed. The large loculated right pleural effusion was localized. A right lateral approach mid axillary line was marked. Under sterile conditions and local anesthesia, an 18 gauge 10 cm access needle was advanced percutaneously right lateral axillary approach into the pleural effusion. There was return of exudative fluid. Amplatz guidewire inserted followed by tract dilatation to insert a 14 French drain. Drain catheter position confirmed with CT. Chest tube connected to pleura vac. 400 cc removed. Sample sent for culture. Catheter secured with a Prolene suture and a sterile dressing. No immediate complication. Patient tolerated the procedure well. FINDINGS: Imaging confirms needle placed in the right loculated effusion for drain insertion IMPRESSION: Successful CT-guided 14 French right chest tube insertion Electronically Signed   By: Judie Petit.  Shick M.D.   On: 12/10/2017 08:17    Labs:  CBC: Recent Labs    12/07/17 0125 12/08/17 0345 12/10/17 0350 12/12/17 0701  WBC 27.4* 32.0* 21.9* 12.6*  HGB 10.4* 9.9* 11.2* 10.1*  HCT 30.3* 28.3* 33.2* 30.9*  PLT 541* 509* 592* 445*  COAGS: Recent Labs    12/07/17 1128  INR 1.26  APTT 39*    BMP: Recent Labs    12/07/17 0955 12/09/17 0506 12/10/17 0350 12/12/17 0701  NA 128* 132* 136 143  K 4.0 3.8 4.5 3.7  CL 100 98 100 110   CO2 18* 21* 21* 23  GLUCOSE 131* 101* 74 107*  BUN 35* 26* 52* 41*  CALCIUM 7.6* 8.2* 8.3* 7.8*  CREATININE 1.13 0.85 1.37* 0.95  GFRNONAA >60 >60 52* >60  GFRAA >60 >60 >60 >60    LIVER FUNCTION TESTS: Recent Labs    12/07/17 0125 12/10/17 0350  BILITOT 1.1 1.0  AST 55* 2,053*  ALT 25 538*  ALKPHOS 42 60  PROT 6.4* 6.3*  ALBUMIN 2.0* 1.7*    Assessment and Plan:  Right empyema Right chest tube drain intact 1400 cc serous fluid in pleurvac Will follow Plan per [facility] Dr Mariea Clonts  Electronically Signed: Ralene Muskrat A, PA-C 12/13/2017, 8:56 AM   I spent a total of 15 Minutes at the the patient's bedside AND on the patient's hospital floor or unit, greater than 50% of which was counseling/coordinating care for right chest tube placement

## 2017-12-13 NOTE — Progress Notes (Signed)
[facility]  Date of Admission:  12/07/2017     Total days of antibiotics 8         ASSESSMENT/PLAN  Kevin Jimenez is a 67 y/o male with previous medical history of HIV, polysubstance abuse, hypertension, COPD, and Hepatitis C admitted to the hospital on 12/07/17 with the chief complaint of generalized weakness and cough and found to have a lung abscess and empyema.   Lung Abscess/Empyema - Cultures remain with no growth after 3 days. He has remained afebrile and has continued improvement in his WBC count now at 12.6 down from 21.9. Clinically he continues to have improvements. He is currently on Day 8 of antimicrobial therapy. At discharge will plan for change of medication to Augmentin. Continue current dose of Unasyn.   AIDS - Discussed importance of taking Biktarvy daily as he had previously been taking it sporadically. Continue current dosage of Biktarvy. Plan for CD4 and viral load follow up in 1 month.   Principal Problem:   Sepsis due to pneumonia [facility]) Active Problems:   History of substance abuse   HIV (+) Human immunodeficiency virus disease (HCC)   Tobacco abuse   Hyponatremia   Acute kidney injury (HCC)   Protein-calorie malnutrition, severe   . bictegravir-emtricitabine-tenofovir AF  1 tablet Oral Daily  . feeding supplement  1 Container Oral BID BM  . feeding supplement (ENSURE ENLIVE)  237 mL Oral BID BM  . folic acid  1 mg Oral Daily  . guaiFENesin  600 mg Oral BID  . metoprolol tartrate  12.5 mg Oral BID  . multivitamin with minerals  1 tablet Oral Daily  . nicotine  14 mg Transdermal Daily  . sodium chloride flush  5 mL Intracatheter Q8H  . thiamine  100 mg Oral Daily    SUBJECTIVE:  Afebrile overnight. Lab work reviewed with improved WBC count. Culture finalized with no growth.   Feeling good. Denies chest pain or shortness of breath. States he is going to be taking his Biktarvy daily.   No Known Allergies   Review of  Systems: Review of Systems  Constitutional: Negative for chills, fever and malaise/fatigue.  Respiratory: Negative for cough, sputum production, shortness of breath and wheezing.   Cardiovascular: Negative for chest pain and leg swelling.  Gastrointestinal: Negative for abdominal pain, constipation, diarrhea, nausea and vomiting.  Skin: Negative for rash.      OBJECTIVE: Vitals:   12/12/17 1508 12/12/17 2052 12/13/17 0500 12/13/17 1026  BP: 115/78 122/89 121/85 (!) 138/91  Pulse: 78 85 (!) 46   Resp: 20 (!) 33 (!) 30 (!) 36  Temp: 97.8 F (36.6 C) 98.4 F (36.9 C) 97.6 F (36.4 C)   TempSrc: Oral Oral Oral   SpO2: 100% 100% 100%   Weight:   130 lb 15.3 oz (59.4 kg)   Height:       Body mass index is 18.26 kg/m.  Physical Exam  Constitutional: He is oriented to person, place, and time. He appears well-developed and well-nourished. He has a sickly appearance. He appears ill. No distress. Nasal cannula in place.  Cardiovascular: Normal rate, regular rhythm, normal heart sounds and intact distal pulses.  Pulmonary/Chest: Effort normal. He has rhonchi.  Pig tail remains present with yellowish pleural fluid with some apparent sediment. Site appears clean, dry and intact.   Neurological: He is alert and oriented to person, place, and time.  Skin: Skin is warm and dry.  Psychiatric: He has a normal  mood and affect. His behavior is normal. Judgment and thought content normal.    Lab Results Lab Results  Component Value Date   WBC 12.6 (H) 12/12/2017   HGB 10.1 (L) 12/12/2017   HCT 30.9 (L) 12/12/2017   MCV 99.7 12/12/2017   PLT 445 (H) 12/12/2017    Lab Results  Component Value Date   CREATININE 0.95 12/12/2017   BUN 41 (H) 12/12/2017   NA 143 12/12/2017   K 3.7 12/12/2017   CL 110 12/12/2017   CO2 23 12/12/2017    Lab Results  Component Value Date   ALT 538 (H) 12/10/2017   AST 2,053 (H) 12/10/2017   ALKPHOS 60 12/10/2017   BILITOT 1.0 12/10/2017      Microbiology: Recent Results (from the past 240 hour(s))  Urine culture     Status: Abnormal   Collection Time: 12/07/17  3:00 AM  Result Value Ref Range Status   Specimen Description URINE, RANDOM  Final   Special Requests   Final    NONE Performed at [facility], [address] [place], [place] [address]    Culture MULTIPLE SPECIES PRESENT, SUGGEST RECOLLECTION (A)  Final   Report Status 12/08/2017 FINAL  Final  Blood Culture (routine x 2)     Status: None   Collection Time: 12/07/17  3:32 AM  Result Value Ref Range Status   Specimen Description BLOOD RIGHT ANTECUBITAL  Final   Special Requests   Final    BOTTLES DRAWN AEROBIC AND ANAEROBIC Blood Culture adequate volume   Culture   Final    NO GROWTH 5 DAYS Performed at [facility], [address], [place], [place] [address]    Report Status 12/12/2017 FINAL  Final  Blood Culture (routine x 2)     Status: None   Collection Time: 12/07/17  3:47 AM  Result Value Ref Range Status   Specimen Description BLOOD RIGHT WRIST  Final   Special Requests   Final    BOTTLES DRAWN AEROBIC AND ANAEROBIC Blood Culture adequate volume   Culture   Final    NO GROWTH 5 DAYS Performed at [facility], [address], [place], [place] [address]    Report Status 12/12/2017 FINAL  Final  Culture, respiratory (NON-Expectorated)     Status: None   Collection Time: 12/07/17  6:00 AM  Result Value Ref Range Status   Specimen Description SPUTUM  Final   Special Requests NONE  Final   Gram Stain   Final    FEW WBC PRESENT, PREDOMINANTLY PMN ABUNDANT GRAM POSITIVE COCCI IN PAIRS MODERATE GRAM NEGATIVE RODS    Culture   Final    ABUNDANT HAEMOPHILUS INFLUENZAE BETA LACTAMASE NEGATIVE Performed at [facility], [address], [place], [place] [address]    Report Status 12/09/2017 FINAL  Final  Body fluid culture     Status: None   Collection Time: 12/09/17  6:28 PM  Result Value Ref Range Status   Specimen  Description FLUID RIGHT PLEURAL  Final   Special Requests NONE  Final   Gram Stain   Final    MODERATE WBC PRESENT, PREDOMINANTLY PMN NO ORGANISMS SEEN    Culture   Final    NO GROWTH 3 DAYS Performed at [facility], [address] [place], [place] [address]    Report Status 12/13/2017 FINAL  Final     Marcos Eke, NP [facility] [phone] Pager  12/13/2017  1:42 PM

## 2017-12-13 NOTE — Clinical Social Work Note (Signed)
Clinical Social Work Assessment  Patient Details  Name: Kevin Jimenez MRN: [id number] Date of Birth: [birth year]  Date of referral:  12/13/17               Reason for consult:  Facility Placement, Discharge Planning                Permission sought to share information with:  Facility Art therapist granted to share information::     Name::        Agency::  SNFs  Relationship::     Contact Information:     Housing/Transportation Living arrangements for the past 2 months:  Apartment Source of Information:  Patient Patient Interpreter Needed:  None Criminal Activity/Legal Involvement Pertinent to Current Situation/Hospitalization:  No - Comment as needed Significant Relationships:  Parents Lives with:  Parents(mother) Do you feel safe going back to the place where you live?  Yes Need for family participation in patient care:  No (Coment)  Care giving concerns: Patient from home with his mother. PT recommending SNF. Currently has chest tube.   Social Worker assessment / plan: CSW met with patient at bedside. Patient alert and oriented, though with brief answers and flat affect. CSW introduced self and role and discussed disposition planning - PT recommendation for SNF. Patient agreeable to short term SNF. CSW sent out initial referrals, awaiting bed offers. Will provide bed offers when available. CSW to follow and support with disposition planning.  Employment status:    Insurance information:  Programmer, applications, Medicaid In [employer]) PT Recommendations:  [place] / Referral to community resources:  [facility]  Patient/Family's Response to care: Patient without significant response to care.  Patient/Family's Understanding of and Emotional Response to Diagnosis, Current Treatment, and Prognosis: Patient not expressive about care and plan. Agreeable to SNF.  Emotional Assessment Appearance:  Appears stated  age Attitude/Demeanor/Rapport:  Engaged Affect (typically observed):  Accepting, Flat Orientation:  Oriented to Self, Oriented to Place, Oriented to  Time, Oriented to Situation Alcohol / Substance use:  Not Applicable Psych involvement (Current and /or in the community):  No (Comment)  Discharge Needs  Concerns to be addressed:  Discharge Planning Concerns, Care Coordination Readmission within the last 30 days:  No Current discharge risk:  Physical Impairment Barriers to Discharge:  Continued Medical Work up   Estanislado Emms, LCSW 12/13/2017, 11:34 AM

## 2017-12-13 NOTE — Evaluation (Signed)
Occupational Therapy Evaluation Patient Details Name: Kevin Jimenez MRN: [id number] DOB: [birth year] Today's Date: 12/13/2017    History of Present Illness 67 y.o.malewith a PMH of HIV (elite controller) and chronic hepatitis C , noncompliant with Harvoni, hypertension, polysubstance abuse including heroin, tobacco abuse and history of spontaneous pneumothorax admitted on 12/07/2017 with respiratory symptoms and found to have right-sided pneumonia with parapneumonic effusion and empyema    Clinical Impression   PTA, pt was independent with cane for ADL and functional mobility. He reports that he has been assisting his mother with ADL at home. He currently presents with decreased activity tolerance for ADL participation and generalized weakness impacting his ability to participate in ADL and functional mobiltiy at [facility]. He was agreeable only to side steps at EOB and ADL seated at EOB this session and noted to fatigue easily with VSS on 2L O2 via [place]. Pt would benefit from continued OT services while admitted to improve independence and safety with ADL and functional mobility. Recommend short-term SNF level rehabilitation at current functional level but may be able to progress with acute therapies to be appropriate for home health OT post-acute D/C. OT will continue to follow while admitted.     Follow Up Recommendations  SNF;Supervision/Assistance - 24 hour(versus home with HHOT if progresses)    Equipment Recommendations  None recommended by OT    Recommendations for Other Services       Precautions / Restrictions Precautions Precautions: Fall Precaution Comments: chest tube Restrictions Weight Bearing Restrictions: No      Mobility Bed Mobility Overal bed mobility: Needs Assistance Bed Mobility: Supine to Sit;Sit to Supine     Supine to sit: Min assist Sit to supine: Min guard   General bed mobility comments: Assist to scoot hips toward EOB  Transfers Overall transfer  level: Needs assistance Equipment used: Rolling walker (2 wheeled) Transfers: Sit to/from Stand Sit to Stand: From elevated surface;Min assist;+2 safety/equipment         General transfer comment: Min assist to power up to standing with RW. +2 for line management.     Balance Overall balance assessment: Needs assistance Sitting-balance support: No upper extremity supported;Feet supported Sitting balance-Leahy Scale: Fair     Standing balance support: Bilateral upper extremity supported;During functional activity Standing balance-Leahy Scale: Poor Standing balance comment: Reliant on BUE support.                            ADL either performed or assessed with clinical judgement   ADL Overall ADL's : Needs assistance/impaired Eating/Feeding: Set up;Sitting   Grooming: Supervision/safety;Sitting   Upper Body Bathing: Sitting;Min guard   Lower Body Bathing: Minimal assistance;Sit to/from stand   Upper Body Dressing : Set up;Sitting   Lower Body Dressing: Minimal assistance;Sit to/from stand   Toilet Transfer: Min guard;Ambulation;RW;BSC   Toileting- Clothing Manipulation and Hygiene: Maximal assistance;Sit to/from stand       Functional mobility during ADLs: Minimal assistance(taking side steps only) General ADL Comments: Pt declining sitting up in chair but willing to participate at EOB and standing/ambulating at EOB.      Vision Patient Visual Report: No change from baseline Vision Assessment?: No apparent visual deficits     Perception     Praxis      Pertinent Vitals/Pain Pain Assessment: Faces Faces Pain Scale: Hurts a little bit Pain Location: chest tube site on standing Pain Descriptors / Indicators: Aching;Grimacing;Guarding Pain Intervention(s): Limited activity within patient's tolerance;Monitored during  session;Repositioned     Hand Dominance     Extremity/Trunk Assessment Upper Extremity Assessment Upper Extremity Assessment:  Generalized weakness   Lower Extremity Assessment Lower Extremity Assessment: Defer to PT evaluation       Communication Communication Communication: No difficulties   Cognition Arousal/Alertness: Awake/alert Behavior During Therapy: WFL for tasks assessed/performed Overall Cognitive Status: Within Functional Limits for tasks assessed                                     General Comments  VSS throughout session.     Exercises     Shoulder Instructions      Home Living Family/patient expects to be discharged to:: Private residence Living Arrangements: Parent Available Help at Discharge: (aide 2 hours/day per PT did not state this to OT) Type of Home: House Home Access: Stairs to enter [employer] of Steps: 9 Entrance Stairs-Rails: Right Home Layout: Two level;Bed/bath upstairs Alternate Level Stairs-Number of Steps: 18 Alternate Level Stairs-Rails: Right Bathroom Shower/Tub: Chief Strategy Officer: Standard     Home Equipment: Production assistant, radio - single point   Additional Comments: Pt reports he lives with his mom and was assisting her with ADL      Prior Functioning/Environment Level of Independence: Independent with assistive device(s)        Comments: used cane and bathed and dresed himself        OT Problem List: Decreased strength;Decreased activity tolerance;Impaired balance (sitting and/or standing);Decreased safety awareness;Decreased knowledge of use of DME or AE;Decreased knowledge of precautions;Pain      OT Treatment/Interventions: Self-care/ADL training;Therapeutic exercise;Energy conservation;DME and/or AE instruction;Therapeutic activities;Patient/family education;Balance training    OT Goals(Current goals can be found in the care plan section) Acute Rehab OT Goals Patient Stated Goal: to get stronger OT Goal Formulation: With patient Time For Goal Achievement: 12/27/17 Potential to Achieve Goals:  Good ADL Goals Pt Will Perform Grooming: with modified independence;standing Pt Will Perform Lower Body Dressing: with modified independence;sit to/from stand Pt Will Transfer to Toilet: with modified independence;ambulating;regular height toilet Pt Will Perform Toileting - Clothing Manipulation and hygiene: with modified independence;sit to/from stand Pt Will Perform Tub/Shower Transfer: Tub transfer;with supervision;ambulating;shower seat;rolling walker  OT Frequency: Min 2X/week   Barriers to D/C:            Co-evaluation              AM-PAC PT "6 Clicks" Daily Activity     Outcome Measure Help from another person eating meals?: None Help from another person taking care of personal grooming?: None Help from another person toileting, which includes using toliet, bedpan, or urinal?: A Lot Help from another person bathing (including washing, rinsing, drying)?: A Little Help from another person to put on and taking off regular upper body clothing?: A Little Help from another person to put on and taking off regular lower body clothing?: A Little 6 Click Score: 19   End of Session Equipment Utilized During Treatment: Gait belt Nurse Communication: Mobility status;Other (comment)(pt agreeable to OOB for meals)  Activity Tolerance: Patient tolerated treatment well Patient left: in bed;with call bell/phone within reach;with bed alarm set  OT Visit Diagnosis: Other abnormalities of gait and mobility (R26.89);Muscle weakness (generalized) (M62.81)                Time: 0940-1003 OT Time Calculation (min): 23 min Charges:  OT General Charges $OT Visit: 1  Visit OT Evaluation $OT Eval Moderate Complexity: 1 Mod OT Treatments $Self Care/Home Management : 8-22 mins G-Codes:     Doristine Section, MS OTR/L  Pager: [phone]   Antonique Langford A Charlee Whitebread 12/13/2017, 11:27 AM

## 2017-12-13 NOTE — Care Management Important Message (Signed)
Important Message  Patient Details  Name: Kevin Jimenez MRN: [id number] Date of Birth: 1950-07-05   Medicare Important Message Given:  Yes    Adelaide Pfefferkorn P Miral Hoopes 12/13/2017, 3:22 PM

## 2017-12-13 NOTE — Progress Notes (Signed)
Patient Demographics:    Kevin Jimenez, is a 67 y.o. male, DOB - 25-Jan-1951, ZOX:[id number]  Admit date - 12/07/2017   Admitting Physician Briscoe Deutscher, MD  Outpatient Primary MD for the patient is Fleet Contras, MD  LOS - 6   Chief Complaint  Patient presents with  . 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 male with a PMH of HIV (elite controller) and chronic hepatitis C , noncompliant with Harvoni, hypertension, polysubstance abuse including heroin, tobacco abuse and history of spontaneous pneumothorax admitted on 12/07/2017 with respiratory symptoms and found to have right-sided pneumonia with parapneumonic effusion and empyema , status post right-sided chest tube placement  Plan:- 1)HIV - ID consult appreciated, ?? Recent non-compliance, viral load is 240, CD4 count is 90 but this may be falsely low due to acute illness,  C/n  Biktarvy   2)Sepsis secondary to Rt sided PNA with Lung abscess and empyema (large right pleural effusion) - Respiratory  Panel from 12/07/2017 with haemophilus influenza, clinically slowly improving after s/p 14 F Right basilar pleural drainage catheter (Pigtail chest tube)- Keep to LWS at 20 cm water, 400 mL removed on 12/09/2017, about 1.5 Liters out as of 12/13/17.,  As per CT surgery leave drain in until output is less than 50 mL/day, no hypoxia , CT surgery consult appreciated , they do not recommend  VATS for now,  Blood cx, urine culture and pleural fluid culture are all without growth so far ,   c/n IV Unasyn (started 12/07/17), infectious disease has discontinued iv Vancomycin.  Please note the patient is immunocompromised given his HIV status with poor compliance with HIV regimen recently.  White count is down to 12k from a peak of 32K  3)FTT--- per ID physician patient's current weight is consistent with his previous weight in the ID clinic, c/n Remeron 15 mg qhs for appetite stimulation  4)Elevated LFTs---  ??  Encouraged underlying hep C versus side effect of Biktarvy, repeat CMP pending as of 12/13/2017, avoid hepatotoxic agents  5)AKI----acute kidney injury  -   resolved with hydration, creatinine on admission= 1.71  ,   baseline creatinine =  0.76   , creatinine is now= 0.95      ,  Avoid nephrotoxic agents/dehydration/hypotension  6)Hep C--apparently is noncompliant with Harvoni, elevated LFTs noted, query secondary to  Biktarvy    7)Paroxysmal Afib--- not a candidate for anticoagulation as patient may need further surgical intervention for his right chest empyema, he is  back in sinus rhythm now, c/n metoprolol 12.5 mg twice daily  Code Status : DNR  Disposition Plan  :  TBD Consults  :  ID/IR/CT surgery  Procedures- right-sided 14 F pigtail chest tube catheter (s/p Right basilar pleural drainage catheter) for right-sided empyema/abscess by IR  DVT Prophylaxis  :  Heparin - SCDs (hold off on heparin to allow for drainage of empyema)  Lab Results  Component Value Date   PLT 445 (H) 12/12/2017    Inpatient Medications  Scheduled Meds: . bictegravir-emtricitabine-tenofovir AF  1 tablet Oral Daily  . feeding supplement  1 Container Oral BID BM  . feeding supplement (ENSURE ENLIVE)  237 mL Oral BID BM  . folic acid  1 mg Oral Daily  . guaiFENesin  600 mg Oral BID  . metoprolol tartrate  12.5 mg Oral BID  . multivitamin with minerals  1 tablet Oral Daily  . nicotine  14 mg Transdermal Daily  . sodium chloride flush  5 mL Intracatheter Q8H  . thiamine  100 mg Oral  Daily   Continuous Infusions: . ampicillin-sulbactam (UNASYN) IV 3 g (12/13/17 0618)  . dextrose 5 % and 0.9% NaCl Stopped (12/13/17 0618)   PRN Meds:.benzonatate, hydrALAZINE, ondansetron **OR** ondansetron (ZOFRAN) IV   Anti-infectives (From admission, onward)   Start     Dose/Rate Route Frequency Ordered Stop   12/08/17 1430  Ampicillin-Sulbactam (UNASYN) 3 g in sodium chloride 0.9 % 100 mL IVPB     3 g 200 mL/hr over 30 Minutes Intravenous Every 8 hours 12/08/17 0915     12/08/17 0630  azithromycin (ZITHROMAX) 500 mg in sodium chloride 0.9 % 250 mL IVPB  Status:  Discontinued     500 mg 250 mL/hr over 60 Minutes Intravenous Every 24 hours 12/07/17 1014 12/07/17 1442   12/08/17 0600  cefTRIAXone (ROCEPHIN) 2 g in sodium chloride 0.9 % 100 mL IVPB  Status:  Discontinued     2 g 200 mL/hr over 30 Minutes Intravenous Every 24 hours 12/07/17 1014 12/07/17 1442   12/08/17 0300  vancomycin (VANCOCIN) 500 mg in sodium chloride 0.9 % 100 mL IVPB  Status:  Discontinued     500 mg 100 mL/hr over 60 Minutes Intravenous Every 12 hours 12/07/17 1455 12/09/17 1423   12/07/17 1800  bictegravir-emtricitabine-tenofovir AF (BIKTARVY) 50-200-25 MG per tablet 1 tablet     1 tablet Oral Daily 12/07/17 1651     12/07/17 1500  vancomycin (VANCOCIN) IVPB 1000 mg/200 mL premix     1,000 mg 200 mL/hr over 60 Minutes Intravenous  Once 12/07/17 1455 12/07/17 1638   12/07/17 1500  piperacillin-tazobactam (ZOSYN) IVPB 3.375 g  Status:  Discontinued     3.375 g 12.5 mL/hr over 240 Minutes Intravenous Every 8 hours 12/07/17 1455 12/08/17 0915   12/07/17 0245  cefTRIAXone (ROCEPHIN) 2 g in sodium chloride 0.9 % 100 mL IVPB  Status:  Discontinued     2 g 200 mL/hr over 30 Minutes Intravenous Every 24 hours 12/07/17 0236 12/07/17 1007   12/07/17 0245  azithromycin (ZITHROMAX) 500 mg in sodium chloride 0.9 % 250 mL IVPB  Status:  Discontinued     500 mg 250 mL/hr over 60 Minutes Intravenous Every 24 hours  12/07/17 0236 12/07/17 1007        Objective:   Vitals:   12/12/17 1508 12/12/17 2052 12/13/17 0500 12/13/17 1026  BP: 115/78 122/89 121/85 (!) 138/91  Pulse: 78 85 (!) 46   Resp: 20 (!) 33 (!) 30 (!) 36  Temp: 97.8 F (36.6 C) 98.4 F (36.9 C) 97.6 F (36.4 C)   TempSrc: Oral Oral Oral   SpO2: 100% 100% 100%   Weight:   59.4 kg (130 lb 15.3 oz)   Height:        Wt Readings from Last 3  Encounters:  12/13/17 59.4 kg (130 lb 15.3 oz)  04/04/17 52.2 kg (115 lb)  03/30/17 53.1 kg (117 lb)     Intake/Output Summary (Last 24 hours) at 12/13/2017 1323 Last data filed at 12/13/2017 0900 Gross per 24 hour  Intake 3670.9 ml  Output 1390 ml  Net 2280.9 ml    Physical Exam  Gen:- Awake Alert, cachectic, able to speak in sentences HEENT:- [place].AT, No sclera icterus Neck-Supple Neck,No JVD,.  Lungs-right-sided pigtail chest tube, appears to be draining well CV- S1, S2 normal, regular Abd-  +ve B.Sounds, Abd Soft, No tenderness,    Extremity/Skin:- No  edema,   Good pulses  Psych-affect is appropriate, oriented x3 Neuro-no new focal deficits, no tremors   Data Review:   Micro Results Recent Results (from the past 240 hour(s))  Urine culture     Status: Abnormal   Collection Time: 12/07/17  3:00 AM  Result Value Ref Range Status   Specimen Description URINE, RANDOM  Final   Special Requests   Final    NONE Performed at [facility], [address] [place], [place] [address]    Culture MULTIPLE SPECIES PRESENT, SUGGEST RECOLLECTION (A)  Final   Report Status 12/08/2017 FINAL  Final  Blood Culture (routine x 2)     Status: None   Collection Time: 12/07/17  3:32 AM  Result Value Ref Range Status   Specimen Description BLOOD RIGHT ANTECUBITAL  Final   Special Requests   Final    BOTTLES DRAWN AEROBIC AND ANAEROBIC Blood Culture adequate volume   Culture   Final    NO GROWTH 5 DAYS Performed at [facility], [address] [place], [place] [address]     Report Status 12/12/2017 FINAL  Final  Blood Culture (routine x 2)     Status: None   Collection Time: 12/07/17  3:47 AM  Result Value Ref Range Status   Specimen Description BLOOD RIGHT WRIST  Final   Special Requests   Final    BOTTLES DRAWN AEROBIC AND ANAEROBIC Blood Culture adequate volume   Culture   Final    NO GROWTH 5 DAYS Performed at [facility], [address], [place], [place] [address]    Report Status 12/12/2017 FINAL  Final  Culture, respiratory (NON-Expectorated)     Status: None   Collection Time: 12/07/17  6:00 AM  Result Value Ref Range Status   Specimen Description SPUTUM  Final   Special Requests NONE  Final   Gram Stain   Final    FEW WBC PRESENT, PREDOMINANTLY PMN ABUNDANT GRAM POSITIVE COCCI IN PAIRS MODERATE GRAM NEGATIVE RODS    Culture   Final    ABUNDANT HAEMOPHILUS INFLUENZAE BETA LACTAMASE NEGATIVE Performed at [facility], [address], [place], [place] [address]    Report Status 12/09/2017 FINAL  Final  Body fluid culture     Status: None   Collection Time: 12/09/17  6:28 PM  Result Value Ref Range Status   Specimen Description FLUID RIGHT PLEURAL  Final   Special Requests NONE  Final   Gram Stain   Final    MODERATE WBC PRESENT, PREDOMINANTLY PMN NO ORGANISMS SEEN    Culture   Final    NO GROWTH 3 DAYS Performed at [facility], [address] [place], [place] [address]    Report Status 12/13/2017 FINAL  Final    Radiology Reports Dg Chest 2 View  Result Date:  12/07/2017 CLINICAL DATA:  67 year old male with shortness of breath and cough. EXAM: CHEST - 2 VIEW COMPARISON:  Chest radiograph dated 04/04/2017 FINDINGS: Large area of consolidative change at the right lung base abutting the lateral pleural surface. Although this may represent pneumonia a mass is not excluded. Clinical correlation and follow-up to resolution recommended. There is a small right pleural effusion. The left lung is clear. No pneumothorax.  The cardiac silhouette is within normal limits. No acute osseous pathology. IMPRESSION: Right lower lobe consolidation may represent pneumonia. Clinical correlation and follow-up to resolution recommended to exclude an underlying mass. Trace right pleural effusion. Electronically Signed   By: Elgie Collard M.D.   On: 12/07/2017 02:25   Ct Head Wo Contrast  Result Date: 12/07/2017 CLINICAL DATA:  67 year old male with shortness of breath and altered mental status. EXAM: CT HEAD WITHOUT CONTRAST TECHNIQUE: Contiguous axial images were obtained from the base of the skull through the vertex without intravenous contrast. COMPARISON:  None. FINDINGS: Brain: The ventricles and sulci appropriate size for patient's age. Mild periventricular and deep white matter chronic microvascular ischemic changes noted. 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 There is no acute intracranial hemorrhage. No mass effect or midline shift. No extra-axial fluid collection. Vascular: No hyperdense vessel or unexpected calcification. Skull: Normal. Negative for fracture or focal lesion. Sinuses/Orbits: Mild diffuse mucoperiosteal thickening of paranasal sinuses. No air-fluid levels. The mastoid air cells are clear. Other: None IMPRESSION: 1. No acute intracranial pathology. 2. Mild chronic microvascular ischemic changes. Electronically Signed   By: Elgie Collard M.D.   On: 12/07/2017 02:23   Ct Chest W Contrast  Result Date: 12/07/2017 CLINICAL DATA:  Cough and shortness of breath, HIV. Unintentional weight loss. EXAM: CT CHEST WITH CONTRAST TECHNIQUE: Multidetector CT imaging of the chest was performed during intravenous contrast administration. CONTRAST:  OMNIPAQUE IOHEXOL 300 MG/ML  SOLN COMPARISON:  Chest radiographs 12/07/2017 and CT chest 03/30/2017. FINDINGS: Cardiovascular: Atherosclerotic calcification of the arterial vasculature, including coronary arteries. Heart is at the  upper limits normal in size to mildly enlarged. No pericardial effusion. Mediastinum/Nodes: Mediastinal lymph nodes measure up to 9 mm in the low right paratracheal station. Hilar lymph nodes measure up to 1.4 cm on the right. No axillary adenopathy. Esophagus is grossly unremarkable. Lungs/Pleura: Right lower lobe consolidation with areas of decreased attenuation, air and air-fluid levels. Highly loculated large right pleural effusion. Centrilobular and paraseptal emphysema. Subpleural ground-glass in the posterior left upper lobe, likely infectious or inflammatory in etiology. Small left pleural effusion, simple in appearance, with minimal compressive atelectasis in the left lower lobe. Upper Abdomen: Visualized portion of the liver is mildly heterogeneous, which may be due to arterial phase imaging. There are 2 areas of hyper attenuation within the right hepatic lobe, measuring up to 1.6 cm visualized portions of the adrenal glands, kidneys, spleen, pancreas, stomach and bowel are grossly unremarkable. Upper abdominal lymph nodes are not enlarged by CT size criteria. Musculoskeletal: No worrisome lytic or sclerotic lesions. IMPRESSION: 1. Right lower lobe consolidation is likely due to pneumonia. Internal fluid, air and air-fluid levels indicate associated necrosis/abscess formation. An underlying mass cannot be definitively excluded. Follow-up to clearing is recommended. 2. Large loculated right pleural effusion is indicative of an empyema. 3. Right hilar adenopathy, likely reactive. This can also be re-evaluated on follow-up imaging. 4. Small left pleural effusion. 5. Hyperattenuating lesions in the right hepatic lobe are difficult to further characterize but may represent  perfusion anomalies or flash fill hemangiomas. If further evaluation is desired, MR abdomen without and with contrast is recommended. 6. Aortic atherosclerosis (ICD10-170.0). Coronary artery calcification. 7.  Emphysema (ICD10-J43.9).  Electronically Signed   By: Leanna Battles M.D.   On: 12/07/2017 12:57   Dg Chest Port 1 View  Result Date: 12/12/2017 CLINICAL DATA:  Dyspnea, pleural effusions, right-sided chest tube. EXAM: PORTABLE CHEST 1 VIEW COMPARISON:  Portable chest x-ray of December 11, 2017 FINDINGS: Confluent airspace opacity persists in the right mid and lower lung. There is a moderate amount of pleural fluid on the right which is stable. The right pigtail pleural drainage catheter is in stable position inferiorly. On the left the lung is better inflated. There is left basilar atelectasis. There is a small left pleural effusion. The heart is top-normal in size. The pulmonary vascularity is normal. There is calcification in the wall of the aortic arch. The bony thorax is unremarkable. IMPRESSION: Persistent right basilar atelectasis and/or pneumonia with moderate-sized right pleural effusion. Stable positioning of the pigtail catheter on the right. No pneumothorax. Stable small left pleural effusion and basilar atelectasis. Thoracic aortic atherosclerosis. Electronically Signed   By: David  [place] M.D.   On: 12/12/2017 07:49   Dg Chest Port 1 View  Result Date: 12/11/2017 CLINICAL DATA:  Shortness of breath. EXAM: PORTABLE CHEST 1 VIEW COMPARISON:  12/10/2017 FINDINGS: Pigtail drainage catheter over right lung base unchanged. No evidence pneumothorax. Lungs are adequately inflated with persistent opacification over the right mid to lower lung unchanged likely right-sided effusion with atelectasis. Possible very small amount of left pleural fluid unchanged. Cardiomediastinal silhouette and remainder the exam is unchanged. IMPRESSION: Stable opacification over the right mid to lower lung likely effusion with atelectasis. Pigtail drainage catheter over the right base unchanged. No pneumothorax. Tiny stable amount left pleural fluid. Electronically Signed   By: Elberta Fortis M.D.   On: 12/11/2017 10:51   Dg Chest Port 1  View  Result Date: 12/10/2017 CLINICAL DATA:  Empyema.  Status post drainage catheter placement. EXAM: PORTABLE CHEST 1 VIEW COMPARISON:  CT scan 12/07/2017 and chest x-ray, same date. FINDINGS: The cardiac silhouette, mediastinal and hilar contours are within normal limits and stable. The left lung remains clear. There is a pigtail catheter noted at the right lung base which was placed into the empyema cavity. The loculated right basilar fluid collection is smaller. There is surrounding pneumonia and atelectasis which also appears slightly improved. IMPRESSION: Right basilar pleural drainage catheter in good position with decreased loculated right pleural fluid collection/empyema. Persistent right lower lobe pneumonia and atelectasis but it may be slightly improved. The left lung remains clear. Electronically Signed   By: Rudie Meyer M.D.   On: 12/10/2017 08:40   Ct Image Guided Drainage By Percutaneous Catheter  Result Date: 12/10/2017 INDICATION: Sepsis, pneumonia, HIV, right parapneumonic effusion versus empyema EXAM: CT-GUIDED 14 FRENCH RIGHT CHEST TUBE INSERTION MEDICATIONS: The patient is currently admitted to the hospital and receiving intravenous antibiotics. The antibiotics were administered within an appropriate time frame prior to the initiation of the procedure. ANESTHESIA/SEDATION: 2.0 mg IV Versed 100 mcg IV Fentanyl Moderate Sedation Time:  20 MINUTES The patient was continuously monitored during the procedure by the interventional radiology nurse under my direct supervision. COMPLICATIONS: None immediate. TECHNIQUE: Informed written consent was obtained from the patient after a thorough discussion of the procedural risks, benefits and alternatives. All questions were addressed. Maximal Sterile Barrier Technique was utilized including caps, mask, sterile gowns, sterile gloves,  sterile drape, hand hygiene and skin antiseptic. A timeout was performed prior to the initiation of the procedure.  PROCEDURE: The right lateral chest was prepped with ChloraPrep in a sterile fashion, and a sterile drape was applied covering the operative field. A sterile gown and sterile gloves were used for the procedure. Local anesthesia was provided with 1% Lidocaine. Previous imaging reviewed. Patient positioned supine. Noncontrast localization CT performed. The large loculated right pleural effusion was localized. A right lateral approach mid axillary line was marked. Under sterile conditions and local anesthesia, an 18 gauge 10 cm access needle was advanced percutaneously right lateral axillary approach into the pleural effusion. There was return of exudative fluid. Amplatz guidewire inserted followed by tract dilatation to insert a 14 French drain. Drain catheter position confirmed with CT. Chest tube connected to pleura vac. 400 cc removed. Sample sent for culture. Catheter secured with a Prolene suture and a sterile dressing. No immediate complication. Patient tolerated the procedure well. FINDINGS: Imaging confirms needle placed in the right loculated effusion for drain insertion IMPRESSION: Successful CT-guided 14 French right chest tube insertion Electronically Signed   By: Judie Petit.  Shick M.D.   On: 12/10/2017 08:17     CBC Recent Labs  Lab 12/07/17 0125 12/08/17 0345 12/10/17 0350 12/12/17 0701  WBC 27.4* 32.0* 21.9* 12.6*  HGB 10.4* 9.9* 11.2* 10.1*  HCT 30.3* 28.3* 33.2* 30.9*  PLT 541* 509* 592* 445*  MCV 95.3 92.8 95.4 99.7  MCH 32.7 32.5 32.2 32.6  MCHC 34.3 35.0 33.7 32.7  RDW 11.3* 11.4* 12.1 12.5  LYMPHSABS 1.1  --   --   --   MONOABS 0.0*  --   --   --   EOSABS 0.0  --   --   --   BASOSABS 0.0  --   --   --     Chemistries  Recent Labs  Lab 12/07/17 0125 12/07/17 0955 12/09/17 0506 12/10/17 0350 12/12/17 0701  NA 122* 128* 132* 136 143  K 4.0 4.0 3.8 4.5 3.7  CL 90* 100 98 100 110  CO2 19* 18* 21* 21* 23  GLUCOSE 122* 131* 101* 74 107*  BUN 43* 35* 26* 52* 41*  CREATININE  1.71* 1.13 0.85 1.37* 0.95  CALCIUM 8.2* 7.6* 8.2* 8.3* 7.8*  AST 55*  --   --  2,053*  --   ALT 25  --   --  538*  --   ALKPHOS 42  --   --  60  --   BILITOT 1.1  --   --  1.0  --    ------------------------------------------------------------------------------------------------------------------ No results for input(s): CHOL, HDL, LDLCALC, TRIG, CHOLHDL, LDLDIRECT in the last 72 hours.  No results found for: HGBA1C ------------------------------------------------------------------------------------------------------------------ No results for input(s): TSH, T4TOTAL, T3FREE, THYROIDAB in the last 72 hours.  Invalid input(s): FREET3 ------------------------------------------------------------------------------------------------------------------ No results for input(s): VITAMINB12, FOLATE, FERRITIN, TIBC, IRON, RETICCTPCT in the last 72 hours.  Coagulation profile Recent Labs  Lab 12/07/17 1128  INR 1.26    No results for input(s): DDIMER in the last 72 hours.  Cardiac Enzymes No results for input(s): CKMB, TROPONINI, MYOGLOBIN in the last 168 hours.  Invalid input(s): CK ------------------------------------------------------------------------------------------------------------------ No results found for: BNP   Shon Hale M.D on 12/13/2017 at 1:23 PM   Go to [email or web address] - password [email or web address] for contact info  [facility] - Office  [phone]

## 2017-12-14 ENCOUNTER — Inpatient Hospital Stay (HOSPITAL_COMMUNITY): Payer: Medicare Other

## 2017-12-14 DIAGNOSIS — R740 Nonspecific elevation of levels of transaminase and lactic acid dehydrogenase [LDH]: Secondary | ICD-10-CM

## 2017-12-14 LAB — CBC
HCT: 27.7 % — ABNORMAL LOW (ref 39.0–52.0)
Hemoglobin: 8.8 g/dL — ABNORMAL LOW (ref 13.0–17.0)
MCH: 32.4 pg (ref 26.0–34.0)
MCHC: 31.8 g/dL (ref 30.0–36.0)
MCV: 101.8 fL — ABNORMAL HIGH (ref 78.0–100.0)
Platelets: 319 10*3/uL (ref 150–400)
RBC: 2.72 MIL/uL — ABNORMAL LOW (ref 4.22–5.81)
RDW: 13 % (ref 11.5–15.5)
WBC: 11.2 10*3/uL — ABNORMAL HIGH (ref 4.0–10.5)

## 2017-12-14 LAB — COMPREHENSIVE METABOLIC PANEL
ALBUMIN: 1.5 g/dL — AB (ref 3.5–5.0)
ALK PHOS: 86 U/L (ref 38–126)
ALT: 585 U/L — AB (ref 0–44)
AST: 543 U/L — AB (ref 15–41)
Anion gap: 3 — ABNORMAL LOW (ref 5–15)
BILIRUBIN TOTAL: 0.9 mg/dL (ref 0.3–1.2)
BUN: 11 mg/dL (ref 8–23)
CALCIUM: 7.5 mg/dL — AB (ref 8.9–10.3)
CO2: 26 mmol/L (ref 22–32)
CREATININE: 0.72 mg/dL (ref 0.61–1.24)
Chloride: 111 mmol/L (ref 98–111)
GFR calc Af Amer: >60 mL/min (ref >60)
GFR calc non Af Amer: >60 mL/min (ref >60)
GLUCOSE: 121 mg/dL — AB (ref 70–99)
Potassium: 3.6 mmol/L (ref 3.5–5.1)
Sodium: 140 mmol/L (ref 135–145)
TOTAL PROTEIN: 5.3 g/dL — AB (ref 6.5–8.1)

## 2017-12-14 MED ORDER — HALOPERIDOL LACTATE 5 MG/ML IJ SOLN: 2.0000 mg | Freq: Four times a day (QID) | INTRAMUSCULAR | Status: DC | PRN

## 2017-12-14 MED ORDER — TRAMADOL HCL 50 MG PO TABS
50.0000 mg | ORAL_TABLET | Freq: Once | ORAL | Status: AC
  Administered 2017-12-14: 50 mg via ORAL
  Filled 2017-12-14: qty 1

## 2017-12-14 NOTE — Progress Notes (Signed)
PROGRESS NOTE    Kevin Jimenez  OZH:[id number] DOB: 10-May-1951 DOA: 12/07/2017 PCP: Fleet Contras, MD   Brief Narrative: Patient is a 67 year old male with past medical history of HIV, chronic hepatitis C, noncompliant with Harvoni, hypertension, polysubstance abuse including heroin, tobacco abuse, history of a spontaneous pneumothorax who  is being managed for right-sided pneumonia with parapneumonic effusion and empyema.  He is status post right-sided chest tube placement.  He was admitted on 7/17 when he presented to the emergency department with complaints of weight loss, shortness of breath and cough.  Assessment & Plan:   Principal Problem:   Sepsis due to pneumonia [facility]) Active Problems:   History of substance abuse   HIV (+) Human immunodeficiency virus disease (HCC)   Tobacco abuse   Hyponatremia   Acute kidney injury (HCC)   Protein-calorie malnutrition, severe  HIV: ID following.  Recent noncompliance.  Viral load of 240 CD will count is 90 but could be falsely low due to acute illness.  Continue Biktarvy.  Sepsis secondary to right-sided pneumonia with lung abscess and empyema: Found to have large right pleural effusion.  Status post chest tube placement which is still draining. Cardiothoracic surgery following.  As per CT surgery, drain will be continued until output is less than 50 mL/day.  No recommendation of VATS.  Blood culture, urine culture and peripheral culture are all without growth so far.  Continue Unasyn.  Started on 12/07/17.  Failure to thrive: Continue nutrition supplement.  Started on Remeron for appetite suppression.  Elevated LFTs: Hepatitis C versus side effect of Biktarvy.  Continue to monitor.  Improving.  Avoid hepatotoxic agents  AKI: Resolved with hydration.  Hepatitis C: Apparently noncompliant with Harvoni.  Follow-up with ID as an outpatient.  Paroxysmal A. fib: Not a candidate for anticoagulation.  He is back in his normal sinus rhythm.   Continue metoprolol   DVT prophylaxis: SCD Code Status: DNR Family Communication: None present at the bedside Disposition Plan: Likely SNF after chest tube removal   Consultants: CT surgery, ID  Procedures:chest tube placement   Antimicrobials: Unasyn  Subjective: Patient seen and examined the bedside this morning.  Remains comfortable.  Denies any complaints.  No shortness of breath or chest pain.  Objective: Vitals:   12/13/17 1026 12/13/17 1614 12/13/17 2037 12/14/17 0500  BP: (!) 138/91 98/85 (!) 133/93 127/86  Pulse:  83 85 84  Resp: (!) 36 (!) 29 (!) 32 (!) 22  Temp:  98.3 F (36.8 C) 98.6 F (37 C) 99.7 F (37.6 C)  TempSrc:  Oral Oral Oral  SpO2:  100% 100% 100%  Weight:    58.2 kg (128 lb 4.9 oz)  Height:        Intake/Output Summary (Last 24 hours) at 12/14/2017 1140 Last data filed at 12/14/2017 1000 Gross per 24 hour  Intake 3106.19 ml  Output 1730 ml  Net 1376.19 ml   Filed Weights   12/10/17 0527 12/13/17 0500 12/14/17 0500  Weight: 59.9 kg (132 lb 0.9 oz) 59.4 kg (130 lb 15.3 oz) 58.2 kg (128 lb 4.9 oz)    Examination:  General exam: Appears calm and comfortable ,Not in distress,cachetic HEENT:PERRL,Oral mucosa moist, Ear/Nose normal on gross exam Respiratory system: Bilateral equal air entry, normal vesicular breath sounds, no wheezes or crackles .  Right-sided chest tube Cardiovascular system: S1 & S2 heard, RRR. No JVD, murmurs, rubs, gallops or clicks. No pedal edema. Gastrointestinal system: Abdomen is nondistended, soft and nontender. No organomegaly or masses  felt. Normal bowel sounds heard. Central nervous system: Alert and oriented. No focal neurological deficits. Extremities: No edema, no clubbing ,no cyanosis, distal peripheral pulses palpable. Skin: No rashes, lesions or ulcers,no icterus ,no pallor MSK: Normal muscle bulk,tone ,power Psychiatry: Judgement and insight appear normal. Mood & affect appropriate.     Data Reviewed: I  have personally reviewed following labs and imaging studies  CBC: Recent Labs  Lab 12/08/17 0345 12/10/17 0350 12/12/17 0701 12/14/17 0402  WBC 32.0* 21.9* 12.6* 11.2*  HGB 9.9* 11.2* 10.1* 8.8*  HCT 28.3* 33.2* 30.9* 27.7*  MCV 92.8 95.4 99.7 101.8*  PLT 509* 592* 445* 319   Basic Metabolic Panel: Recent Labs  Lab 12/09/17 0506 12/10/17 0350 12/12/17 0701 12/13/17 1405 12/14/17 0402  NA 132* 136 143 141 140  K 3.8 4.5 3.7 3.8 3.6  CL 98 100 110 111 111  CO2 21* 21* 23 24 26   GLUCOSE 101* 74 107* 91 121*  BUN 26* 52* 41* 17 11  CREATININE 0.85 1.37* 0.95 0.79 0.72  CALCIUM 8.2* 8.3* 7.8* 7.6* 7.5*   GFR: Estimated Creatinine Clearance: 73.8 mL/min (by C-G formula based on SCr of 0.72 mg/dL). Liver Function Tests: Recent Labs  Lab 12/10/17 0350 12/13/17 1405 12/14/17 0402  AST 2,053* 868* 543*  ALT 538* 726* 585*  ALKPHOS 60 92 86  BILITOT 1.0 0.7 0.9  PROT 6.3* 5.6* 5.3*  ALBUMIN 1.7* 1.6* 1.5*   No results for input(s): LIPASE, AMYLASE in the last 168 hours. No results for input(s): AMMONIA in the last 168 hours. Coagulation Profile: No results for input(s): INR, PROTIME in the last 168 hours. Cardiac Enzymes: No results for input(s): CKTOTAL, CKMB, CKMBINDEX, TROPONINI in the last 168 hours. BNP (last 3 results) No results for input(s): PROBNP in the last 8760 hours. HbA1C: No results for input(s): HGBA1C in the last 72 hours. CBG: No results for input(s): GLUCAP in the last 168 hours. Lipid Profile: No results for input(s): CHOL, HDL, LDLCALC, TRIG, CHOLHDL, LDLDIRECT in the last 72 hours. Thyroid Function Tests: No results for input(s): TSH, T4TOTAL, FREET4, T3FREE, THYROIDAB in the last 72 hours. Anemia Panel: No results for input(s): VITAMINB12, FOLATE, FERRITIN, TIBC, IRON, RETICCTPCT in the last 72 hours. Sepsis Labs: No results for input(s): PROCALCITON, LATICACIDVEN in the last 168 hours.  Recent Results (from the past 240 hour(s))  Urine  culture     Status: Abnormal   Collection Time: 12/07/17  3:00 AM  Result Value Ref Range Status   Specimen Description URINE, RANDOM  Final   Special Requests   Final    NONE Performed at [facility], [address] [place], [place]    Culture MULTIPLE SPECIES PRESENT, SUGGEST RECOLLECTION (A)  Final   Report Status 12/08/2017 FINAL  Final  Blood Culture (routine x 2)     Status: None   Collection Time: 12/07/17  3:32 AM  Result Value Ref Range Status   Specimen Description BLOOD RIGHT ANTECUBITAL  Final   Special Requests   Final    BOTTLES DRAWN AEROBIC AND ANAEROBIC Blood Culture adequate volume   Culture   Final    NO GROWTH 5 DAYS Performed at [facility], [address] [place], [place]    Report Status 12/12/2017 FINAL  Final  Blood Culture (routine x 2)     Status: None   Collection Time: 12/07/17  3:47 AM  Result Value Ref Range Status   Specimen Description BLOOD  RIGHT WRIST  Final   Special Requests   Final    BOTTLES DRAWN AEROBIC AND ANAEROBIC Blood Culture adequate volume   Culture   Final    NO GROWTH 5 DAYS Performed at [facility], [address] [place], [place] [address]    Report Status 12/12/2017 FINAL  Final  Culture, respiratory (NON-Expectorated)     Status: None   Collection Time: 12/07/17  6:00 AM  Result Value Ref Range Status   Specimen Description SPUTUM  Final   Special Requests NONE  Final   Gram Stain   Final    FEW WBC PRESENT, PREDOMINANTLY PMN ABUNDANT GRAM POSITIVE COCCI IN PAIRS MODERATE GRAM NEGATIVE RODS    Culture   Final    ABUNDANT HAEMOPHILUS INFLUENZAE BETA LACTAMASE NEGATIVE Performed at [facility], [address], [place], [place] [address]    Report Status 12/09/2017 FINAL  Final  Body fluid culture     Status: None   Collection Time: 12/09/17  6:28 PM  Result Value Ref Range Status   Specimen Description FLUID RIGHT PLEURAL  Final   Special Requests NONE  Final    Gram Stain   Final    MODERATE WBC PRESENT, PREDOMINANTLY PMN NO ORGANISMS SEEN    Culture   Final    NO GROWTH 3 DAYS Performed at [facility], [address], [place], [place] [address]    Report Status 12/13/2017 FINAL  Final         Radiology Studies: Dg Chest Port 1 View  Result Date: 12/14/2017 CLINICAL DATA:  Dyspnea. EXAM: PORTABLE CHEST 1 VIEW COMPARISON:  12/12/2017. FINDINGS: Stable cardiomediastinal silhouette. Pigtail catheter RIGHT lower chest is unchanged, along with asymmetric RIGHT lung opacity. No pneumothorax. IMPRESSION: Stable chest. Electronically Signed   By: Elsie Stain M.D.   On: 12/14/2017 07:17        Scheduled Meds: . bictegravir-emtricitabine-tenofovir AF  1 tablet Oral Daily  . feeding supplement  1 Container Oral BID BM  . feeding supplement (ENSURE ENLIVE)  237 mL Oral BID BM  . folic acid  1 mg Oral Daily  . guaiFENesin  600 mg Oral BID  . metoprolol tartrate  12.5 mg Oral BID  . multivitamin with minerals  1 tablet Oral Daily  . nicotine  14 mg Transdermal Daily  . sodium chloride flush  5 mL Intracatheter Q8H  . thiamine  100 mg Oral Daily   Continuous Infusions: . ampicillin-sulbactam (UNASYN) IV 3 g (12/14/17 3151)  . dextrose 5 % and 0.9% NaCl 75 mL/hr at 12/14/17 0645     LOS: 7 days    Time spent: 35 mins.More than 50% of that time was spent in counseling and/or coordination of care.      Burnadette Pop, MD [facility] Pager [phone]  If 7PM-7AM, please contact night-coverage [email or web address] Password [email or web address] 12/14/2017, 11:40 AM

## 2017-12-14 NOTE — Progress Notes (Signed)
PT Cancellation Note  Patient Details Name: Kevin Jimenez MRN: [id number] DOB: 1951-05-13   Cancelled Treatment:    Reason Eval/Treat Not Completed: Other (comment)(refused adamantly.  Will return at later date. )   Berline Lopes 12/14/2017, 12:34 PM  [facility] [phone] (pager)

## 2017-12-14 NOTE — Progress Notes (Signed)
Occupational Therapy Treatment Patient Details Name: Kevin Jimenez MRN: [id number] DOB: May 20, 1951 Today's Date: 12/14/2017    History of present illness 67 y.o.malewith a PMH of HIV (elite controller) and chronic hepatitis C , noncompliant with Harvoni, hypertension, polysubstance abuse including heroin, tobacco abuse and history of spontaneous pneumothorax admitted on 12/07/2017 with respiratory symptoms and found to have right-sided pneumonia with parapneumonic effusion and empyema    OT comments  Patient limited session to bed level UE exercises, refusing mobility or even positioning bed in upright position.  He was educated on importance of mobilization, UE exercises 2-3x/day, eating upright, and limiting laying in bed during daytime in order to minimize deconditioning; patient verbalized understanding but declined further participation. Agreeable to sit up in recliner for dinner tonight, and educated RN on recommendation of sitting in recliner for meals x 3.  Will continue to follow patient while admitted.    Follow Up Recommendations  SNF;Supervision/Assistance - 24 hour(vs HHOT is progresses )    Equipment Recommendations  None recommended by OT    Recommendations for Other Services      Precautions / Restrictions Precautions Precautions: Fall Precaution Comments: chest tube       Mobility Bed Mobility               General bed mobility comments: declined bed mobility or change in positoining to sit upright in bed  Transfers                      Balance                                           ADL either performed or assessed with clinical judgement   ADL Overall ADL's : Needs assistance/impaired                                       General ADL Comments: declined with max encouragement, educated on impacts of bed rest; only agreeable to bed level UB exercises       Vision       Perception     Praxis       Cognition Arousal/Alertness: Awake/alert Behavior During Therapy: WFL for tasks assessed/performed Overall Cognitive Status: Within Functional Limits for tasks assessed                                          Exercises Exercises: General Upper Extremity General Exercises - Upper Extremity Shoulder Flexion: AROM;Both;10 reps;Supine Shoulder Extension: AROM;Both;10 reps;Supine Elbow Flexion: AROM;10 reps;Both;Supine Elbow Extension: AROM;10 reps;Both;Supine   Shoulder Instructions       General Comments      Pertinent Vitals/ Pain       Pain Assessment: Faces Faces Pain Scale: Hurts a little bit Pain Location: chest tube site Pain Descriptors / Indicators: Aching Pain Intervention(s): Limited activity within patient's tolerance  Home Living                                          Prior Functioning/Environment  Frequency  Min 2X/week        Progress Toward Goals  OT Goals(current goals can now be found in the care plan section)  Progress towards OT goals: Progressing toward goals  Acute Rehab OT Goals Patient Stated Goal: to get stronger OT Goal Formulation: With patient Time For Goal Achievement: 12/27/17 Potential to Achieve Goals: Good  Plan Discharge plan remains appropriate;Frequency remains appropriate    Co-evaluation                 AM-PAC PT "6 Clicks" Daily Activity     Outcome Measure   Help from another person eating meals?: None Help from another person taking care of personal grooming?: None Help from another person toileting, which includes using toliet, bedpan, or urinal?: A Lot Help from another person bathing (including washing, rinsing, drying)?: A Little Help from another person to put on and taking off regular upper body clothing?: A Little Help from another person to put on and taking off regular lower body clothing?: A Little 6 Click Score: 19    End of Session    OT  Visit Diagnosis: Other abnormalities of gait and mobility (R26.89);Muscle weakness (generalized) (M62.81)   Activity Tolerance Treatment limited secondary to agitation   Patient Left in bed;with call bell/phone within reach;with bed alarm set   Nurse Communication Mobility status;Other (comment)(pt needs)        Time: 0762-2633 OT Time Calculation (min): 11 min  Charges: OT General Charges $OT Visit: 1 Visit OT Treatments $Therapeutic Exercise: 8-22 mins  Chancy Milroy, OTR/L  Pager [phone]    Chancy Milroy 12/14/2017, 4:09 PM

## 2017-12-14 NOTE — Progress Notes (Signed)
[facility]   Reason for visit: Follow up on empyema, HIV  Interval History: continues to drain, 230 cc last 24 hours; afebrile, WBC 11.2; no associated rash or diarrhea.   Physical Exam: Constitutional:  Vitals:   12/13/17 2037 12/14/17 0500  BP: (!) 133/93 127/86  Pulse: 85 84  Resp: (!) 32 (!) 22  Temp: 98.6 F (37 C) 99.7 F (37.6 C)  SpO2: 100% 100%   patient appears in NAD HENT: no thrush Respiratory: Normal respiratory effort; CTA B Cardiovascular: RRR GI: soft, nt, nd  Review of Systems: Constitutional: negative for fevers and chills Gastrointestinal: negative for nausea and diarrhea Integument/breast: negative for rash  Lab Results  Component Value Date   WBC 11.2 (H) 12/14/2017   HGB 8.8 (L) 12/14/2017   HCT 27.7 (L) 12/14/2017   MCV 101.8 (H) 12/14/2017   PLT 319 12/14/2017    Lab Results  Component Value Date   CREATININE 0.72 12/14/2017   BUN 11 12/14/2017   NA 140 12/14/2017   K 3.6 12/14/2017   CL 111 12/14/2017   CO2 26 12/14/2017    Lab Results  Component Value Date   ALT 585 (H) 12/14/2017   AST 543 (H) 12/14/2017   ALKPHOS 86 12/14/2017     Microbiology: Recent Results (from the past 240 hour(s))  Urine culture     Status: Abnormal   Collection Time: 12/07/17  3:00 AM  Result Value Ref Range Status   Specimen Description URINE, RANDOM  Final   Special Requests   Final    NONE Performed at [facility], [address] [place], [place] [address]    Culture MULTIPLE SPECIES PRESENT, SUGGEST RECOLLECTION (A)  Final   Report Status 12/08/2017 FINAL  Final  Blood Culture (routine x 2)     Status: None   Collection Time: 12/07/17  3:32 AM  Result Value Ref Range Status   Specimen Description BLOOD RIGHT ANTECUBITAL  Final   Special Requests   Final    BOTTLES DRAWN AEROBIC AND ANAEROBIC Blood Culture adequate volume   Culture   Final    NO GROWTH 5 DAYS Performed at [facility],  [address] [place], [place] [address]    Report Status 12/12/2017 FINAL  Final  Blood Culture (routine x 2)     Status: None   Collection Time: 12/07/17  3:47 AM  Result Value Ref Range Status   Specimen Description BLOOD RIGHT WRIST  Final   Special Requests   Final    BOTTLES DRAWN AEROBIC AND ANAEROBIC Blood Culture adequate volume   Culture   Final    NO GROWTH 5 DAYS Performed at [facility], [address] [place], [place] [address]    Report Status 12/12/2017 FINAL  Final  Culture, respiratory (NON-Expectorated)     Status: None   Collection Time: 12/07/17  6:00 AM  Result Value Ref Range Status   Specimen Description SPUTUM  Final   Special Requests NONE  Final   Gram Stain   Final    FEW WBC PRESENT, PREDOMINANTLY PMN ABUNDANT GRAM POSITIVE COCCI IN PAIRS MODERATE GRAM NEGATIVE RODS    Culture   Final    ABUNDANT HAEMOPHILUS INFLUENZAE BETA LACTAMASE NEGATIVE Performed at [facility], [address] [place], [place] [address]    Report Status 12/09/2017 FINAL  Final  Body fluid culture     Status: None   Collection  Time: 12/09/17  6:28 PM  Result Value Ref Range Status   Specimen Description FLUID RIGHT PLEURAL  Final   Special Requests NONE  Final   Gram Stain   Final    MODERATE WBC PRESENT, PREDOMINANTLY PMN NO ORGANISMS SEEN    Culture   Final    NO GROWTH 3 DAYS Performed at [facility], [address], [place], [place] [address]    Report Status 12/13/2017 FINAL  Final    Impression/Plan:  1. Empyema and lung abscess - on amp/sulbactam.  No changes.  Continue while inpatient and plan to discharge on Augmentin for 14 more days when ready.  2.  HIV - continue on Biktarvy and tolerating well.    3.  Transaminitis - continues to improve.  Continue to monitor.    Will follow up again on Friday.

## 2017-12-15 LAB — COMPREHENSIVE METABOLIC PANEL
ALT: 390 U/L — ABNORMAL HIGH (ref 0–44)
AST: 294 U/L — AB (ref 15–41)
Albumin: 1.5 g/dL — ABNORMAL LOW (ref 3.5–5.0)
Alkaline Phosphatase: 86 U/L (ref 38–126)
Anion gap: 3 — ABNORMAL LOW (ref 5–15)
BILIRUBIN TOTAL: 0.8 mg/dL (ref 0.3–1.2)
BUN: 8 mg/dL (ref 8–23)
CHLORIDE: 110 mmol/L (ref 98–111)
CO2: 27 mmol/L (ref 22–32)
Calcium: 7.3 mg/dL — ABNORMAL LOW (ref 8.9–10.3)
Creatinine, Ser: 0.73 mg/dL (ref 0.61–1.24)
Glucose, Bld: 106 mg/dL — ABNORMAL HIGH (ref 70–99)
POTASSIUM: 3.5 mmol/L (ref 3.5–5.1)
Sodium: 140 mmol/L (ref 135–145)
TOTAL PROTEIN: 5.5 g/dL — AB (ref 6.5–8.1)

## 2017-12-15 NOTE — Progress Notes (Signed)
Physical Therapy Treatment Patient Details Name: Kevin Jimenez MRN: [id number] DOB: 20-Mar-1951 Today's Date: 12/15/2017    History of Present Illness Waller Marcussen is a 67 y.o. M with a PMH of HIV (elite controller), Chronic Hep C (non compliant with Harvoni), HTN, polysubstance abuse (heroin, tobacco), PNA,  AKI. Pt admitted on 12/07/2017 to ED complaing of weight loss and SOB. Pt diagnosed with sepsis secondary to R sided PNA and Parapneumonica effusion and empyema.    PT Comments    Pt is progressing toward goals. Pt required Min-ModA for bed mobility, and was able to sit at EOB with MinG. Pt performed sit<>stand and stand/pivot transfers with ModA+2 HH support. Pt was educated on the importance of mobility and sitting up for his lungs. Pt with increased SOB following transfer, however, VSS throughout session on 3 L of O2. Will continue to follow acutely to support independence, mobility, and safety.   Follow Up Recommendations  SNF;Supervision/Assistance - 24 hour     Equipment Recommendations  Rolling walker with 5" wheels;3in1 (PT)    Recommendations for Other Services       Precautions / Restrictions Precautions Precautions: Fall Precaution Comments: chest tube Restrictions Weight Bearing Restrictions: No    Mobility  Bed Mobility Overal bed mobility: Needs Assistance Bed Mobility: Supine to Sit     Supine to sit: HOB elevated;Mod assist;+2 for physical assistance;Min assist;Min guard     General bed mobility comments: Pt required Min-ModA+2 to sit upright, also required assist for line management. Pt was able to scoot hips to EOB with MinG.  Transfers Overall transfer level: Needs assistance Equipment used: 2 person hand held assist Transfers: Sit to/from [employer] Sit to Stand: Mod assist;+2 physical assistance Stand pivot transfers: Mod assist;+2 physical assistance       General transfer comment: Pt stood with ModA+2 for stability and  lift assist to sit->stand, was able to side-step to the chair. Pt performed stand/pivot transfers with ModA+2 to make it to the chair. Pt was unsteady, and used HH assist for balance, but participated in moving his legs and was able to look up during transfers. Pt with increased SOB following transfer. O2 sats at 93% on 3 L. Gave cues for pursed lip breathing.   Ambulation/Gait                 Stairs             Wheelchair Mobility    Modified Rankin (Stroke Patients Only)       Balance Overall balance assessment: Needs assistance Sitting-balance support: Feet supported;No upper extremity supported Sitting balance-Leahy Scale: Fair Sitting balance - Comments: Pt had increased incidence of coughing with sitting up right, but did not have apparent difficulty sitting at EOB. Pt was able to sit with arms in lap.    Standing balance support: Bilateral upper extremity supported Standing balance-Leahy Scale: Poor Standing balance comment: Reliant on BUE support. Pt had strong grip, and knees were slightly unsteady.                             Cognition Arousal/Alertness: Awake/alert Behavior During Therapy: WFL for tasks assessed/performed Overall Cognitive Status: Within Functional Limits for tasks assessed                                 General Comments: Pt is an unreliable reporter of his activities;  pt reported he'd eaten 3X today, and nurse noted he'd eaten breakfast only.       Exercises      General Comments General comments (skin integrity, edema, etc.): Pt had increased SOB sitting at EOB and transferring to chair, but O2 sats WFL on 3 L. Pt educated on the importance of positioning for lung drainage and expansion. Pt BP was 150/100 and 149/97 during session.       Pertinent Vitals/Pain Pain Assessment: Faces Faces Pain Scale: Hurts little more Pain Location: R Hip Pain Descriptors / Indicators: Grimacing;Guarding Pain  Intervention(s): Limited activity within patient's tolerance;Monitored during session;Repositioned    Home Living                      Prior Function            PT Goals (current goals can now be found in the care plan section) Acute Rehab PT Goals Patient Stated Goal: To get out of bed PT Goal Formulation: With patient Time For Goal Achievement: 12/26/17 Potential to Achieve Goals: Good Progress towards PT goals: Progressing toward goals    Frequency    Min 3X/week      PT Plan Current plan remains appropriate    Co-evaluation              AM-PAC PT "6 Clicks" Daily Activity  Outcome Measure  Difficulty turning over in bed (including adjusting bedclothes, sheets and blankets)?: Unable Difficulty moving from lying on back to sitting on the side of the bed? : Unable Difficulty sitting down on and standing up from a chair with arms (e.g., wheelchair, bedside commode, etc,.)?: Unable Help needed moving to and from a bed to chair (including a wheelchair)?: A Lot Help needed walking in hospital room?: A Lot Help needed climbing 3-5 steps with a railing? : Total 6 Click Score: 8    End of Session Equipment Utilized During Treatment: Gait belt;Oxygen Activity Tolerance: Patient tolerated treatment well Patient left: in chair;with call bell/phone within reach;with chair alarm set Nurse Communication: Mobility status PT Visit Diagnosis: Unsteadiness on feet (R26.81);Muscle weakness (generalized) (M62.81);Pain Pain - Right/Left: Right Pain - part of body: Hip     Time: 8527-7824 PT Time Calculation (min) (ACUTE ONLY): 20 min  Charges:  $Therapeutic Activity: 8-22 mins           Demetria Pore, S-DPT Acute Care Rehab Student [phone]            12/15/2017, 6:23 PM

## 2017-12-15 NOTE — Progress Notes (Signed)
Referring Physician(s): Dr. Mariea Clonts  Supervising Physician: Richarda Overlie  Patient Status:  [facility] - In-pt  Chief Complaint: Follow-up right sided chest tube   Subjective:  67 year old male with past medical history significant for HIV, chronic hepatitis C, medical noncompliance, HTN, polysubstance abuse, history spontaneous pneumothorax. Patient presented 7/17 to [facility] c/o SHOB, cough and weight loss. He was found to have sepsis secondary to PNA with lung abscess and empyema. IR was consulted for chest tube placement which was placed on 7/19 by Dr. Miles Costain.  Patient reports he is feeling better although still a little weak. He has a good appetite, denies pain at chest tube insertion site.   Allergies: Patient has no known allergies.  Medications: Prior to Admission medications   Medication Sig Start Date End Date Taking? Authorizing Provider  lisinopril-hydrochlorothiazide (PRINZIDE,ZESTORETIC) 10-12.5 MG tablet Take 1 tablet daily by mouth. 02/04/17  Yes [provider]  meloxicam (MOBIC) 15 MG tablet Take 1 tablet (15 mg total) by mouth daily. 08/04/15  Yes Delano Metz, MD  VENTOLIN HFA 108 [phone] Base) MCG/ACT inhaler Take 2 puffs 4 (four) times daily as needed by mouth for shortness of breath. 02/03/17  Yes [provider]  emtricitabine-tenofovir AF (DESCOVY) 200-25 MG tablet Take 1 tablet by mouth daily. Patient not taking: Reported on 12/07/2017 07/16/15   Gardiner Barefoot, MD  gabapentin (NEURONTIN) 100 MG capsule Take 1 capsule (100 mg total) by mouth every 8 (eight) hours. Patient not taking: Reported on 12/07/2017 08/04/15   Delano Metz, MD  Ledipasvir-Sofosbuvir (HARVONI) 90-400 MG TABS Take 1 tablet by mouth daily. Patient not taking: Reported on 12/07/2017 01/21/16   Gardiner Barefoot, MD     Vital Signs: BP (!) 134/93 (BP Location: Right Arm)   Pulse 85   Temp 98.3 F (36.8 C) (Oral)   Resp (!) 29   Ht 5\' 11"  (1.803 m)   Wt 129 lb 10.1 oz (58.8  kg)   SpO2 100%   BMI 18.08 kg/m   Physical Exam  Constitutional: He is oriented to person, place, and time. No distress.  HENT:  Head: Normocephalic.  Cardiovascular: Normal rate, regular rhythm and normal heart sounds.  No murmur heard. Pulmonary/Chest: Effort normal and breath sounds normal. He exhibits no tenderness.  Right sided chest tube in place; insertion site clean, dry, intact without erythema, edema or discharge. 150 mL total of chylous liquid in Pleurvac.  Abdominal: Soft. He exhibits no distension. There is no tenderness.  Neurological: He is alert and oriented to person, place, and time.  Skin: Skin is warm and dry. No rash noted. He is not diaphoretic.  Psychiatric: His behavior is normal.  Nursing note and vitals reviewed.   Imaging: Dg Chest Port 1 View  Result Date: 12/14/2017 CLINICAL DATA:  Dyspnea. EXAM: PORTABLE CHEST 1 VIEW COMPARISON:  12/12/2017. FINDINGS: Stable cardiomediastinal silhouette. Pigtail catheter RIGHT lower chest is unchanged, along with asymmetric RIGHT lung opacity. No pneumothorax. IMPRESSION: Stable chest. Electronically Signed   By: 12/14/2017 M.D.   On: 12/14/2017 07:17   Dg Chest Port 1 View  Result Date: 12/12/2017 CLINICAL DATA:  Dyspnea, pleural effusions, right-sided chest tube. EXAM: PORTABLE CHEST 1 VIEW COMPARISON:  Portable chest x-ray of December 11, 2017 FINDINGS: Confluent airspace opacity persists in the right mid and lower lung. There is a moderate amount of pleural fluid on the right which is stable. The right pigtail pleural drainage catheter is in stable position inferiorly. On the left  the lung is better inflated. There is left basilar atelectasis. There is a small left pleural effusion. The heart is top-normal in size. The pulmonary vascularity is normal. There is calcification in the wall of the aortic arch. The bony thorax is unremarkable. IMPRESSION: Persistent right basilar atelectasis and/or pneumonia with moderate-sized  right pleural effusion. Stable positioning of the pigtail catheter on the right. No pneumothorax. Stable small left pleural effusion and basilar atelectasis. Thoracic aortic atherosclerosis. Electronically Signed   By: David  [place] M.D.   On: 12/12/2017 07:49    Labs:  CBC: Recent Labs    12/08/17 0345 12/10/17 0350 12/12/17 0701 12/14/17 0402  WBC 32.0* 21.9* 12.6* 11.2*  HGB 9.9* 11.2* 10.1* 8.8*  HCT 28.3* 33.2* 30.9* 27.7*  PLT 509* 592* 445* 319    COAGS: Recent Labs    12/07/17 1128  INR 1.26  APTT 39*    BMP: Recent Labs    12/12/17 0701 12/13/17 1405 12/14/17 0402 12/15/17 0431  NA 143 141 140 140  K 3.7 3.8 3.6 3.5  CL 110 111 111 110  CO2 23 24 26 27   GLUCOSE 107* 91 121* 106*  BUN 41* 17 11 8   CALCIUM 7.8* 7.6* 7.5* 7.3*  CREATININE 0.95 0.79 0.72 0.73  GFRNONAA >60 >60 >60 >60  GFRAA >60 >60 >60 >60    LIVER FUNCTION TESTS: Recent Labs    12/10/17 0350 12/13/17 1405 12/14/17 0402 12/15/17 0431  BILITOT 1.0 0.7 0.9 0.8  AST 2,053* 868* 543* 294*  ALT 538* 726* 585* 390*  ALKPHOS 60 92 86 86  PROT 6.3* 5.6* 5.3* 5.5*  ALBUMIN 1.7* 1.6* 1.5* 1.5*    Assessment and Plan:  Right chest tube placement 7/19 by Dr. 12/17/17 due to empyema. Patient improving overall, afebrile, SHOB improved. Drainage still remains moderate at 150 mL since Pleurvac change yesterday. Per CT surgery drain to remain until output is less than 50 mL/day. Will continue to follow along.   Electronically Signed: 8/19, PA-C 12/15/2017, 9:26 AM   I spent a total of 15 Minutes at the the patient's bedside AND on the patient's hospital floor or unit, greater than 50% of which was counseling/coordinating care for right sided chest tube s/p empyema.

## 2017-12-15 NOTE — Progress Notes (Signed)
Occupational Therapy Treatment Patient Details Name: Kevin Jimenez MRN: [id number] DOB: Jul 04, 1950 Today's Date: 12/15/2017    History of present illness 67 y.o.malewith a PMH of HIV (elite controller) and chronic hepatitis C , noncompliant with Harvoni, hypertension, polysubstance abuse including heroin, tobacco abuse and history of spontaneous pneumothorax admitted on 12/07/2017 with respiratory symptoms and found to have right-sided pneumonia with parapneumonic effusion and empyema    OT comments  Patient continues to require maximal encouragement to exit bed, but agreeable to only sit EOB today (declining transfer to recliner).  Patient able to complete bed mobility and scooting towards [facility] with min guard assistance, increased time due to weakness.  Oxygen saturations stable on supplemental 3L via [place], BP supine 135/90, EOB 142/94, EOB x 5 min 150/101, supine 137/98 with patient reporting "dizzy headed" when seated EOB (nursing aware).  Therapist encouraged patient and educated nursing on sitting up at least for meals x 3, patient continues to report he has been out of bed but unsure of reliability. Patient provided with incentive spirometer (which was in room), and encouraged to use 10x/hr.  Will continue to follow while admitted.    Follow Up Recommendations  SNF;Supervision/Assistance - 24 hour(vs HH if progresses)    Equipment Recommendations  None recommended by OT    Recommendations for Other Services      Precautions / Restrictions Precautions Precautions: Fall Precaution Comments: chest tube Restrictions Weight Bearing Restrictions: No       Mobility Bed Mobility Overal bed mobility: Needs Assistance Bed Mobility: Supine to Sit;Sit to Supine     Supine to sit: Min assist;HOB elevated Sit to supine: Min guard      Transfers                 General transfer comment: declined transfer, willing to scoot to [facility] from seated level with supervision    Balance  Overall balance assessment: Needs assistance Sitting-balance support: No upper extremity supported;Feet supported Sitting balance-Leahy Scale: Fair                                     ADL either performed or assessed with clinical judgement   ADL Overall ADL's : Needs assistance/impaired     Grooming: Supervision/safety;Sitting               Lower Body Dressing: Maximal assistance;Sitting/lateral leans Lower Body Dressing Details (indicate cue type and reason): able to partially don L sock, refused to adjust R sock; limited functional reach to B feet              Functional mobility during ADLs: Minimal assistance(bed mobility ) General ADL Comments: patient requires max encouragement for bed mobility, scooting towards head of bed; limited tolerance     Vision       Perception     Praxis      Cognition Arousal/Alertness: Awake/alert Behavior During Therapy: WFL for tasks assessed/performed Overall Cognitive Status: Within Functional Limits for tasks assessed                                          Exercises     Shoulder Instructions       General Comments DBP increased to 101 seated EOB    Pertinent Vitals/ Pain       Pain Assessment: No/denies  pain  Home Living                                          Prior Functioning/Environment              Frequency  Min 2X/week        Progress Toward Goals  OT Goals(current goals can now be found in the care plan section)  Progress towards OT goals: Progressing toward goals  Acute Rehab OT Goals Patient Stated Goal: to get stronger OT Goal Formulation: With patient Time For Goal Achievement: 12/27/17 Potential to Achieve Goals: Good  Plan Discharge plan remains appropriate;Frequency remains appropriate    Co-evaluation                 AM-PAC PT "6 Clicks" Daily Activity     Outcome Measure   Help from another person eating meals?:  None Help from another person taking care of personal grooming?: None Help from another person toileting, which includes using toliet, bedpan, or urinal?: A Lot Help from another person bathing (including washing, rinsing, drying)?: A Little Help from another person to put on and taking off regular upper body clothing?: A Little Help from another person to put on and taking off regular lower body clothing?: A Lot 6 Click Score: 18    End of Session    OT Visit Diagnosis: Other abnormalities of gait and mobility (R26.89);Muscle weakness (generalized) (M62.81)   Activity Tolerance Patient limited by fatigue   Patient Left in bed;with call bell/phone within reach;with bed alarm set   Nurse Communication Mobility status;Other (comment)(meals EOB x 3)        Time: 7353-2992 OT Time Calculation (min): 24 min  Charges: OT General Charges $OT Visit: 1 Visit OT Treatments $Self Care/Home Management : 23-37 mins  Chancy Milroy, OTR/L  Pager [phone]    Chancy Milroy 12/15/2017, 10:06 AM

## 2017-12-15 NOTE — Plan of Care (Signed)

## 2017-12-15 NOTE — Progress Notes (Signed)
PROGRESS NOTE    Kevin Jimenez  MLY:[id number] DOB: [birth year] DOA: 12/07/2017 PCP: Fleet Contras, MD   Brief Narrative: Patient is a 67 year old male with past medical history of HIV, chronic hepatitis C, noncompliant with Harvoni, hypertension, polysubstance abuse including heroin, tobacco abuse, history of a spontaneous pneumothorax who  is being managed for right-sided pneumonia with parapneumonic effusion and empyema.  He is status post right-sided chest tube placement.  He was admitted on 7/17 when he presented to the emergency department with complaints of weight loss, shortness of breath and cough.  Assessment & Plan:   Principal Problem:   Sepsis due to pneumonia [facility]) Active Problems:   History of substance abuse   HIV (+) Human immunodeficiency virus disease (HCC)   Tobacco abuse   Hyponatremia   Acute kidney injury (HCC)   Protein-calorie malnutrition, severe  HIV: ID following.  Recent noncompliance.  Viral load of 240 CD will count is 90 but could be falsely low due to acute illness.  Continue Biktarvy.  Sepsis secondary to right-sided pneumonia with lung abscess and empyema: Found to have large right pleural effusion.  Status post chest tube placement which is still draining. Cardiothoracic surgery following.  As per CT surgery, drain will be continued until output is less than 50 mL/day.  No recommendation of VATS.  Blood culture, urine culture and peripheral culture are all without growth so far.  Continue Unasyn.  Started on 12/07/17.  Failure to thrive: Continue nutrition supplement.  Started on Remeron for appetite suppression.  Elevated LFTs: Hepatitis C versus side effect of Biktarvy.  Continue to monitor.  Improving.  Avoid hepatotoxic agents  AKI: Resolved with hydration.  Hepatitis C: Apparently noncompliant with Harvoni.  Follow-up with ID as an outpatient.  Paroxysmal A. fib: Not a candidate for anticoagulation.  He is back in his normal sinus rhythm.   Continue metoprolol   DVT prophylaxis: SCD Code Status: DNR Family Communication: None present at the bedside Disposition Plan: Likely SNF after chest tube removal   Consultants: CT surgery, ID  Procedures:chest tube placement   Antimicrobials: Unasyn  Subjective: Patient seen and examined the bedside this morning.  Remains comfortable.  Participate with physical therapy today.  No new issues/events.  Denies any chest pain or shortness of breath.  Chest tube drainage has been improving.  Objective: Vitals:   12/14/17 1434 12/14/17 2045 12/15/17 0500 12/15/17 1338  BP: 120/85 134/86 (!) 134/93 (!) 141/90  Pulse: 78 81 85   Resp: (!) 26 (!) 21 (!) 29   Temp: 98.7 F (37.1 C) 98.5 F (36.9 C) 98.3 F (36.8 C) 99.1 F (37.3 C)  TempSrc: Oral Oral Oral Oral  SpO2: 100% 100% 100% 100%  Weight:   58.8 kg (129 lb 10.1 oz)   Height:        Intake/Output Summary (Last 24 hours) at 12/15/2017 1346 Last data filed at 12/15/2017 1242 Gross per 24 hour  Intake 2537.2 ml  Output 687 ml  Net 1850.2 ml   Filed Weights   12/13/17 0500 12/14/17 0500 12/15/17 0500  Weight: 59.4 kg (130 lb 15.3 oz) 58.2 kg (128 lb 4.9 oz) 58.8 kg (129 lb 10.1 oz)    Examination:  General exam: Appears calm and comfortable ,Not in distress,cachetic HEENT:PERRL,Oral mucosa moist, Ear/Nose normal on gross exam Respiratory system: Bilateral equal air entry, normal vesicular breath sounds, no wheezes or crackles .  Right-sided chest tube Cardiovascular system: S1 & S2 heard, RRR. No JVD, murmurs, rubs, gallops or  clicks. No pedal edema. Gastrointestinal system: Abdomen is nondistended, soft and nontender. No organomegaly or masses felt. Normal bowel sounds heard. Central nervous system: Alert and oriented. No focal neurological deficits. Extremities: No edema, no clubbing ,no cyanosis, distal peripheral pulses palpable. Skin: No rashes, lesions or ulcers,no icterus ,no pallor MSK: Normal muscle bulk,tone  ,power Psychiatry: Judgement and insight appear normal. Mood & affect appropriate.     Data Reviewed: I have personally reviewed following labs and imaging studies  CBC: Recent Labs  Lab 12/10/17 0350 12/12/17 0701 12/14/17 0402  WBC 21.9* 12.6* 11.2*  HGB 11.2* 10.1* 8.8*  HCT 33.2* 30.9* 27.7*  MCV 95.4 99.7 101.8*  PLT 592* 445* 319   Basic Metabolic Panel: Recent Labs  Lab 12/10/17 0350 12/12/17 0701 12/13/17 1405 12/14/17 0402 12/15/17 0431  NA 136 143 141 140 140  K 4.5 3.7 3.8 3.6 3.5  CL 100 110 111 111 110  CO2 21* 23 24 26 27   GLUCOSE 74 107* 91 121* 106*  BUN 52* 41* 17 11 8   CREATININE 1.37* 0.95 0.79 0.72 0.73  CALCIUM 8.3* 7.8* 7.6* 7.5* 7.3*   GFR: Estimated Creatinine Clearance: 74.5 mL/min (by C-G formula based on SCr of 0.73 mg/dL). Liver Function Tests: Recent Labs  Lab 12/10/17 0350 12/13/17 1405 12/14/17 0402 12/15/17 0431  AST 2,053* 868* 543* 294*  ALT 538* 726* 585* 390*  ALKPHOS 60 92 86 86  BILITOT 1.0 0.7 0.9 0.8  PROT 6.3* 5.6* 5.3* 5.5*  ALBUMIN 1.7* 1.6* 1.5* 1.5*   No results for input(s): LIPASE, AMYLASE in the last 168 hours. No results for input(s): AMMONIA in the last 168 hours. Coagulation Profile: No results for input(s): INR, PROTIME in the last 168 hours. Cardiac Enzymes: No results for input(s): CKTOTAL, CKMB, CKMBINDEX, TROPONINI in the last 168 hours. BNP (last 3 results) No results for input(s): PROBNP in the last 8760 hours. HbA1C: No results for input(s): HGBA1C in the last 72 hours. CBG: No results for input(s): GLUCAP in the last 168 hours. Lipid Profile: No results for input(s): CHOL, HDL, LDLCALC, TRIG, CHOLHDL, LDLDIRECT in the last 72 hours. Thyroid Function Tests: No results for input(s): TSH, T4TOTAL, FREET4, T3FREE, THYROIDAB in the last 72 hours. Anemia Panel: No results for input(s): VITAMINB12, FOLATE, FERRITIN, TIBC, IRON, RETICCTPCT in the last 72 hours. Sepsis Labs: No results for  input(s): PROCALCITON, LATICACIDVEN in the last 168 hours.  Recent Results (from the past 240 hour(s))  Urine culture     Status: Abnormal   Collection Time: 12/07/17  3:00 AM  Result Value Ref Range Status   Specimen Description URINE, RANDOM  Final   Special Requests   Final    NONE Performed at [facility], [address], [place], [place] [address]    Culture MULTIPLE SPECIES PRESENT, SUGGEST RECOLLECTION (A)  Final   Report Status 12/08/2017 FINAL  Final  Blood Culture (routine x 2)     Status: None   Collection Time: 12/07/17  3:32 AM  Result Value Ref Range Status   Specimen Description BLOOD RIGHT ANTECUBITAL  Final   Special Requests   Final    BOTTLES DRAWN AEROBIC AND ANAEROBIC Blood Culture adequate volume   Culture   Final    NO GROWTH 5 DAYS Performed at [facility], [address] [place], [place] [address]    Report Status 12/12/2017 FINAL  Final  Blood Culture (routine x 2)     Status: None  Collection Time: 12/07/17  3:47 AM  Result Value Ref Range Status   Specimen Description BLOOD RIGHT WRIST  Final   Special Requests   Final    BOTTLES DRAWN AEROBIC AND ANAEROBIC Blood Culture adequate volume   Culture   Final    NO GROWTH 5 DAYS Performed at [facility], [address], [place], [place] [address]    Report Status 12/12/2017 FINAL  Final  Culture, respiratory (NON-Expectorated)     Status: None   Collection Time: 12/07/17  6:00 AM  Result Value Ref Range Status   Specimen Description SPUTUM  Final   Special Requests NONE  Final   Gram Stain   Final    FEW WBC PRESENT, PREDOMINANTLY PMN ABUNDANT GRAM POSITIVE COCCI IN PAIRS MODERATE GRAM NEGATIVE RODS    Culture   Final    ABUNDANT HAEMOPHILUS INFLUENZAE BETA LACTAMASE NEGATIVE Performed at [facility], [address] [place], [place] [address]    Report Status 12/09/2017 FINAL  Final  Body fluid culture     Status: None   Collection Time: 12/09/17  6:28 PM    Result Value Ref Range Status   Specimen Description FLUID RIGHT PLEURAL  Final   Special Requests NONE  Final   Gram Stain   Final    MODERATE WBC PRESENT, PREDOMINANTLY PMN NO ORGANISMS SEEN    Culture   Final    NO GROWTH 3 DAYS Performed at [facility], [address] [place], [place] [address]    Report Status 12/13/2017 FINAL  Final         Radiology Studies: Dg Chest Port 1 View  Result Date: 12/14/2017 CLINICAL DATA:  Dyspnea. EXAM: PORTABLE CHEST 1 VIEW COMPARISON:  12/12/2017. FINDINGS: Stable cardiomediastinal silhouette. Pigtail catheter RIGHT lower chest is unchanged, along with asymmetric RIGHT lung opacity. No pneumothorax. IMPRESSION: Stable chest. Electronically Signed   By: Elsie Stain M.D.   On: 12/14/2017 07:17        Scheduled Meds: . bictegravir-emtricitabine-tenofovir AF  1 tablet Oral Daily  . feeding supplement  1 Container Oral BID BM  . feeding supplement (ENSURE ENLIVE)  237 mL Oral BID BM  . folic acid  1 mg Oral Daily  . guaiFENesin  600 mg Oral BID  . metoprolol tartrate  12.5 mg Oral BID  . multivitamin with minerals  1 tablet Oral Daily  . nicotine  14 mg Transdermal Daily  . sodium chloride flush  5 mL Intracatheter Q8H  . thiamine  100 mg Oral Daily   Continuous Infusions: . ampicillin-sulbactam (UNASYN) IV 3 g (12/15/17 1334)  . dextrose 5 % and 0.9% NaCl 75 mL/hr at 12/15/17 1242     LOS: 8 days    Time spent: 35 mins.More than 50% of that time was spent in counseling and/or coordination of care.      Burnadette Pop, MD [facility] Pager [phone]  If 7PM-7AM, please contact night-coverage [email or web address] Password [email or web address] 12/15/2017, 1:46 PM

## 2017-12-16 ENCOUNTER — Inpatient Hospital Stay (HOSPITAL_COMMUNITY): Payer: Medicare Other

## 2017-12-16 LAB — CBC WITH DIFFERENTIAL/PLATELET
ABS IMMATURE GRANULOCYTES: 0.1 10*3/uL (ref 0.0–0.1)
BASOS PCT: 1 %
Basophils Absolute: 0 10*3/uL (ref 0.0–0.1)
Eosinophils Absolute: 0.1 10*3/uL (ref 0.0–0.7)
Eosinophils Relative: 1 %
HEMATOCRIT: 34.2 % — AB (ref 39.0–52.0)
HEMOGLOBIN: 10.2 g/dL — AB (ref 13.0–17.0)
IMMATURE GRANULOCYTES: 2 %
LYMPHS ABS: 1.1 10*3/uL (ref 0.7–4.0)
Lymphocytes Relative: 13 %
MCH: 32.3 pg (ref 26.0–34.0)
MCHC: 29.8 g/dL — ABNORMAL LOW (ref 30.0–36.0)
MCV: 108.2 fL — ABNORMAL HIGH (ref 78.0–100.0)
MONO ABS: 0.7 10*3/uL (ref 0.1–1.0)
MONOS PCT: 8 %
NEUTROS ABS: 6.7 10*3/uL (ref 1.7–7.7)
NEUTROS PCT: 75 %
PLATELETS: 288 10*3/uL (ref 150–400)
RBC: 3.16 MIL/uL — ABNORMAL LOW (ref 4.22–5.81)
RDW: 13.2 % (ref 11.5–15.5)
WBC: 8.8 10*3/uL (ref 4.0–10.5)

## 2017-12-16 LAB — COMPREHENSIVE METABOLIC PANEL
ALBUMIN: 1.5 g/dL — AB (ref 3.5–5.0)
ALT: 285 U/L — AB (ref 0–44)
ANION GAP: 6 (ref 5–15)
AST: 191 U/L — ABNORMAL HIGH (ref 15–41)
Alkaline Phosphatase: 77 U/L (ref 38–126)
BUN: 6 mg/dL — ABNORMAL LOW (ref 8–23)
CHLORIDE: 109 mmol/L (ref 98–111)
CO2: 24 mmol/L (ref 22–32)
Calcium: 7.1 mg/dL — ABNORMAL LOW (ref 8.9–10.3)
Creatinine, Ser: 0.68 mg/dL (ref 0.61–1.24)
GFR calc Af Amer: >60 mL/min (ref >60)
GFR calc non Af Amer: >60 mL/min (ref >60)
GLUCOSE: 106 mg/dL — AB (ref 70–99)
Potassium: 3.7 mmol/L (ref 3.5–5.1)
SODIUM: 139 mmol/L (ref 135–145)
Total Bilirubin: 1 mg/dL (ref 0.3–1.2)
Total Protein: 5.7 g/dL — ABNORMAL LOW (ref 6.5–8.1)

## 2017-12-16 NOTE — Progress Notes (Signed)
  Subjective: Patient appears stronger Chest x-ray slowly improving Pleural catheter output remains significant-280 cc yesterday Pleural fluid culture negative   objective: Vital signs in last 24 hours: Temp:  [98.8 F (37.1 C)-99.6 F (37.6 C)] 98.8 F (37.1 C) (07/26 0504) Pulse Rate:  [90-92] 91 (07/26 0504) Cardiac Rhythm: Normal sinus rhythm (07/26 0700) Resp:  [23-29] 29 (07/26 0504) BP: (130-161)/(90-98) 130/94 (07/26 0504) SpO2:  [100 %] 100 % (07/26 0504) Weight:  [146 lb 6.2 oz (66.4 kg)] 146 lb 6.2 oz (66.4 kg) (07/26 0504)  Hemodynamic parameters for last 24 hours:  Afebrile Intake/Output from previous day: 07/25 0701 - 07/26 0700 In: 2083.5 [I.V.:1583.5; IV Piggyback:500] Out: 1075 [Urine:795; Chest Tube:280] Intake/Output this shift: No intake/output data recorded.  Up in chair Breath sounds slightly diminished on right Right pleural catheter intact and connected to Pleur-evac canister  Lab Results: Recent Labs    12/14/17 0402 12/16/17 0525  WBC 11.2* 8.8  HGB 8.8* 10.2*  HCT 27.7* 34.2*  PLT 319 288   BMET:  Recent Labs    12/15/17 0431 12/16/17 0525  NA 140 139  K 3.5 3.7  CL 110 109  CO2 27 24  GLUCOSE 106* 106*  BUN 8 6*  CREATININE 0.73 0.68  CALCIUM 7.3* 7.1*    PT/INR: No results for input(s): LABPROT, INR in the last 72 hours. ABG    Component Value Date/Time   PHART 7.405 12/10/2017 0610   HCO3 19.0 (L) 12/10/2017 0610   ACIDBASEDEF 4.9 (H) 12/10/2017 0610   O2SAT 96.2 12/10/2017 0610   CBG (last 3)  No results for input(s): GLUCAP in the last 72 hours.  Assessment/Plan: S/P  Continue IV antibiotics and pigtail catheter. Remove pigtail catheter when output less than 60 mL's per day Patient not candidate for VATS because of debility and nonischemic cardiomyopathy  LOS: 9 days    Kevin Jimenez 12/16/2017

## 2017-12-16 NOTE — Care Management Note (Addendum)
Case Management Note  Patient Details  Name: Kevin Jimenez MRN: [id number] Date of Birth: Nov 23, 1950  Subjective/Objective:  Pt presented for generalized weakness and cough- lung abscess with empyema. Pt has chest tube to suction. Plan for SNF Once stable.                 Action/Plan: CSW following for disposition needs. CM will continue to monitor for additional needs.   Expected Discharge Date:                  Expected Discharge Plan:  Skilled Nursing Facility  In-House Referral:  Clinical Social Work  Discharge planning Services  CM Consult  Post Acute Care Choice:  NA Choice offered to:  NA  DME Arranged:  N/A DME Agency:  NA  HH Arranged:  NA HH Agency:  NA  Status of Service:  Completed, signed off  If discussed at Long Length of Stay Meetings, dates discussed:    Additional Comments: 1542 12-21-17 Tomi Bamberger, RN,BSN [phone] Right Lower Lobe Pneumonia. With loculated pleural effusion- IV Unasyn continues. Chest Tube removed and continues on IV Lasix. CSW to continue to monitor for transition of care needs.  Gala Lewandowsky, RN 12/16/2017, 4:10 PM

## 2017-12-16 NOTE — Progress Notes (Signed)
[facility]   Reason for visit: Follow up on empyema, HIV  Interval History: no fever, no associated rash or diarrhea.  No new issues.  WBC wnl, afebrile.  Continues to have significant drainage from CT.     Physical Exam: Constitutional:  Vitals:   12/16/17 0504 12/16/17 0858  BP: (!) 130/94   Pulse: 91 92  Resp: (!) 29 (!) 33  Temp: 98.8 F (37.1 C)   SpO2: 100% 100%   patient appears in NAD Eyes: anicteric Respiratory: Normal respiratory effort; CTA B CT in place and draining Cardiovascular: RRR GI: soft, nt, nd  Review of Systems: Constitutional: negative for fevers, chills and anorexia Gastrointestinal: negative for nausea and diarrhea Integument/breast: negative for rash  Lab Results  Component Value Date   WBC 8.8 12/16/2017   HGB 10.2 (L) 12/16/2017   HCT 34.2 (L) 12/16/2017   MCV 108.2 (H) 12/16/2017   PLT 288 12/16/2017    Lab Results  Component Value Date   CREATININE 0.68 12/16/2017   BUN 6 (L) 12/16/2017   NA 139 12/16/2017   K 3.7 12/16/2017   CL 109 12/16/2017   CO2 24 12/16/2017    Lab Results  Component Value Date   ALT 285 (H) 12/16/2017   AST 191 (H) 12/16/2017   ALKPHOS 77 12/16/2017     Microbiology: Recent Results (from the past 240 hour(s))  Urine culture     Status: Abnormal   Collection Time: 12/07/17  3:00 AM  Result Value Ref Range Status   Specimen Description URINE, RANDOM  Final   Special Requests   Final    NONE Performed at [facility], [address], [place], [place] [address]    Culture MULTIPLE SPECIES PRESENT, SUGGEST RECOLLECTION (A)  Final   Report Status 12/08/2017 FINAL  Final  Blood Culture (routine x 2)     Status: None   Collection Time: 12/07/17  3:32 AM  Result Value Ref Range Status   Specimen Description BLOOD RIGHT ANTECUBITAL  Final   Special Requests   Final    BOTTLES DRAWN AEROBIC AND ANAEROBIC Blood Culture adequate volume   Culture   Final    NO GROWTH  5 DAYS Performed at [facility], [address], [place], [place] [address]    Report Status 12/12/2017 FINAL  Final  Blood Culture (routine x 2)     Status: None   Collection Time: 12/07/17  3:47 AM  Result Value Ref Range Status   Specimen Description BLOOD RIGHT WRIST  Final   Special Requests   Final    BOTTLES DRAWN AEROBIC AND ANAEROBIC Blood Culture adequate volume   Culture   Final    NO GROWTH 5 DAYS Performed at [facility], [address], [place], [place] [address]    Report Status 12/12/2017 FINAL  Final  Culture, respiratory (NON-Expectorated)     Status: None   Collection Time: 12/07/17  6:00 AM  Result Value Ref Range Status   Specimen Description SPUTUM  Final   Special Requests NONE  Final   Gram Stain   Final    FEW WBC PRESENT, PREDOMINANTLY PMN ABUNDANT GRAM POSITIVE COCCI IN PAIRS MODERATE GRAM NEGATIVE RODS    Culture   Final    ABUNDANT HAEMOPHILUS INFLUENZAE BETA LACTAMASE NEGATIVE Performed at [facility], [address], [place], [place] [address]    Report Status 12/09/2017 FINAL  Final  Body  fluid culture     Status: None   Collection Time: 12/09/17  6:28 PM  Result Value Ref Range Status   Specimen Description FLUID RIGHT PLEURAL  Final   Special Requests NONE  Final   Gram Stain   Final    MODERATE WBC PRESENT, PREDOMINANTLY PMN NO ORGANISMS SEEN    Culture   Final    NO GROWTH 3 DAYS Performed at [facility], [address] [place], [place] [address]    Report Status 12/13/2017 FINAL  Final    Impression/Plan:  1. Empyema - continues to drain.  Will continue with broad coverage with amp/sulbactam  2.  HIV - continue with Biktarvy.  Tolerating it well.  Has beennearly two weeks.  Will recheck viral load and CD4 count.    3.  Chronic hepatitis C - I will again consider treatment for him as an outpatient   I will follow up again on Monday.  Please call me over the weekend if he is being discharged sooner.    thanks

## 2017-12-16 NOTE — Progress Notes (Signed)
PROGRESS NOTE    Kevin Jimenez  UTM:[id number] DOB: 1950/09/10 DOA: 12/07/2017 PCP: Fleet Contras, MD   Brief Narrative: Patient is a 67 year old male with past medical history of HIV, chronic hepatitis C, noncompliant with Harvoni, hypertension, polysubstance abuse including heroin, tobacco abuse, history of a spontaneous pneumothorax who  is being managed for right-sided pneumonia with parapneumonic effusion and empyema.  He is status post right-sided chest tube placement.  He was admitted on 7/17 when he presented to the emergency department with complaints of weight loss, shortness of breath and cough.  Assessment & Plan:   Principal Problem:   Sepsis due to pneumonia [facility]) Active Problems:   History of substance abuse   HIV (+) Human immunodeficiency virus disease (HCC)   Tobacco abuse   Hyponatremia   Acute kidney injury (HCC)   Protein-calorie malnutrition, severe  HIV: ID following.  H/O noncompliance.  Continue Biktarvy.  ID rechecking viral load and CD4 count  Sepsis secondary to right-sided pneumonia with lung abscess and empyema: Found to have large right pleural effusion.  Status post chest tube placement which is still draining. Cardiothoracic surgery following.  As per CT surgery, drain will be continued until output is less than 60 mL/day.  No recommendation of VATS.  Blood culture, urine culture and peripheral culture are all without growth so far.  Continue Unasyn as per ID  Started on 12/07/17.  Failure to thrive: Continue nutrition supplement.  Started on Remeron for appetite suppression.  Elevated LFTs: Hepatitis C versus side effect of Biktarvy.  Continue to monitor.  Improving.  Avoid hepatotoxic agents  AKI: Resolved with hydration.  Hepatitis C: Apparently noncompliant with Harvoni.  Follow-up with ID as an outpatient.  Paroxysmal A. fib: Not a candidate for anticoagulation.  He is back in his normal sinus rhythm.  Continue metoprolol   DVT prophylaxis:  SCD Code Status: DNR Family Communication: None present at the bedside Disposition Plan: Likely SNF after chest tube removal.SW following   Consultants: CT surgery, ID  Procedures:chest tube placement   Antimicrobials: Unasyn  Subjective: Patient seen and examined at the bedside this morning.  Remains comfortable.  No new issues/events.  Denies any chest pain or shortness of breath.  No overnight fever.  Complain of some nausea this morning, Zofran ordered. Objective: Vitals:   12/15/17 2122 12/16/17 0504 12/16/17 0858 12/16/17 1416  BP: (!) 161/98 (!) 130/94  131/90  Pulse: 92 91 92 87  Resp: (!) 28 (!) 29 (!) 33   Temp: 99.6 F (37.6 C) 98.8 F (37.1 C)  98.3 F (36.8 C)  TempSrc: Oral Oral  Oral  SpO2: 100% 100% 100% 100%  Weight:  66.4 kg (146 lb 6.2 oz)    Height:        Intake/Output Summary (Last 24 hours) at 12/16/2017 1457 Last data filed at 12/16/2017 0527 Gross per 24 hour  Intake 458.96 ml  Output 1075 ml  Net -616.04 ml   Filed Weights   12/14/17 0500 12/15/17 0500 12/16/17 0504  Weight: 58.2 kg (128 lb 4.9 oz) 58.8 kg (129 lb 10.1 oz) 66.4 kg (146 lb 6.2 oz)    Examination:  General exam: Appears calm and comfortable ,Not in distress, thin built HEENT:PERRL,Oral mucosa moist, Ear/Nose normal on gross exam Respiratory system: Decreased air entry in the right side, right-sided chest tube Cardiovascular system: S1 & S2 heard, RRR. No JVD, murmurs, rubs, gallops or clicks. Gastrointestinal system: Abdomen is nondistended, soft and nontender. No organomegaly or masses felt. Normal  bowel sounds heard. Central nervous system: Alert and oriented. No focal neurological deficits. Extremities: No edema, no clubbing ,no cyanosis, distal peripheral pulses palpable. Skin: No rashes, lesions or ulcers,no icterus ,no pallor MSK: Normal muscle bulk,tone ,power Psychiatry: Judgement and insight appear normal. Mood & affect appropriate.       Data Reviewed: I have  personally reviewed following labs and imaging studies  CBC: Recent Labs  Lab 12/10/17 0350 12/12/17 0701 12/14/17 0402 12/16/17 0525  WBC 21.9* 12.6* 11.2* 8.8  NEUTROABS  --   --   --  6.7  HGB 11.2* 10.1* 8.8* 10.2*  HCT 33.2* 30.9* 27.7* 34.2*  MCV 95.4 99.7 101.8* 108.2*  PLT 592* 445* 319 288   Basic Metabolic Panel: Recent Labs  Lab 12/12/17 0701 12/13/17 1405 12/14/17 0402 12/15/17 0431 12/16/17 0525  NA 143 141 140 140 139  K 3.7 3.8 3.6 3.5 3.7  CL 110 111 111 110 109  CO2 23 24 26 27 24   GLUCOSE 107* 91 121* 106* 106*  BUN 41* 17 11 8  6*  CREATININE 0.95 0.79 0.72 0.73 0.68  CALCIUM 7.8* 7.6* 7.5* 7.3* 7.1*   GFR: Estimated Creatinine Clearance: 84.2 mL/min (by C-G formula based on SCr of 0.68 mg/dL). Liver Function Tests: Recent Labs  Lab 12/10/17 0350 12/13/17 1405 12/14/17 0402 12/15/17 0431 12/16/17 0525  AST 2,053* 868* 543* 294* 191*  ALT 538* 726* 585* 390* 285*  ALKPHOS 60 92 86 86 77  BILITOT 1.0 0.7 0.9 0.8 1.0  PROT 6.3* 5.6* 5.3* 5.5* 5.7*  ALBUMIN 1.7* 1.6* 1.5* 1.5* 1.5*   No results for input(s): LIPASE, AMYLASE in the last 168 hours. No results for input(s): AMMONIA in the last 168 hours. Coagulation Profile: No results for input(s): INR, PROTIME in the last 168 hours. Cardiac Enzymes: No results for input(s): CKTOTAL, CKMB, CKMBINDEX, TROPONINI in the last 168 hours. BNP (last 3 results) No results for input(s): PROBNP in the last 8760 hours. HbA1C: No results for input(s): HGBA1C in the last 72 hours. CBG: No results for input(s): GLUCAP in the last 168 hours. Lipid Profile: No results for input(s): CHOL, HDL, LDLCALC, TRIG, CHOLHDL, LDLDIRECT in the last 72 hours. Thyroid Function Tests: No results for input(s): TSH, T4TOTAL, FREET4, T3FREE, THYROIDAB in the last 72 hours. Anemia Panel: No results for input(s): VITAMINB12, FOLATE, FERRITIN, TIBC, IRON, RETICCTPCT in the last 72 hours. Sepsis Labs: No results for  input(s): PROCALCITON, LATICACIDVEN in the last 168 hours.  Recent Results (from the past 240 hour(s))  Urine culture     Status: Abnormal   Collection Time: 12/07/17  3:00 AM  Result Value Ref Range Status   Specimen Description URINE, RANDOM  Final   Special Requests   Final    NONE Performed at [facility], [address] [place], [place] [address]    Culture MULTIPLE SPECIES PRESENT, SUGGEST RECOLLECTION (A)  Final   Report Status 12/08/2017 FINAL  Final  Blood Culture (routine x 2)     Status: None   Collection Time: 12/07/17  3:32 AM  Result Value Ref Range Status   Specimen Description BLOOD RIGHT ANTECUBITAL  Final   Special Requests   Final    BOTTLES DRAWN AEROBIC AND ANAEROBIC Blood Culture adequate volume   Culture   Final    NO GROWTH 5 DAYS Performed at [facility], [address] [place], [place] [address]    Report Status 12/12/2017 FINAL  Final  Blood Culture (  routine x 2)     Status: None   Collection Time: 12/07/17  3:47 AM  Result Value Ref Range Status   Specimen Description BLOOD RIGHT WRIST  Final   Special Requests   Final    BOTTLES DRAWN AEROBIC AND ANAEROBIC Blood Culture adequate volume   Culture   Final    NO GROWTH 5 DAYS Performed at [facility], [address], [place], [place] [address]    Report Status 12/12/2017 FINAL  Final  Culture, respiratory (NON-Expectorated)     Status: None   Collection Time: 12/07/17  6:00 AM  Result Value Ref Range Status   Specimen Description SPUTUM  Final   Special Requests NONE  Final   Gram Stain   Final    FEW WBC PRESENT, PREDOMINANTLY PMN ABUNDANT GRAM POSITIVE COCCI IN PAIRS MODERATE GRAM NEGATIVE RODS    Culture   Final    ABUNDANT HAEMOPHILUS INFLUENZAE BETA LACTAMASE NEGATIVE Performed at [facility], [address], [place], [place] [address]    Report Status 12/09/2017 FINAL  Final  Body fluid culture     Status: None   Collection Time: 12/09/17  6:28 PM    Result Value Ref Range Status   Specimen Description FLUID RIGHT PLEURAL  Final   Special Requests NONE  Final   Gram Stain   Final    MODERATE WBC PRESENT, PREDOMINANTLY PMN NO ORGANISMS SEEN    Culture   Final    NO GROWTH 3 DAYS Performed at [facility], [address], [place], [place] [address]    Report Status 12/13/2017 FINAL  Final         Radiology Studies: Dg Chest Port 1 View  Result Date: 12/16/2017 CLINICAL DATA:  Follow-up chest tube EXAM: PORTABLE CHEST 1 VIEW COMPARISON:  12/14/2017 FINDINGS: Cardiac shadow is stable. Right-sided chest tube is again seen. Fluid is noted loculated within the minor fissure as well as inferiorly on the right. A small left pleural effusion is again seen and stable. No new focal infiltrate is noted. No bony abnormality is seen. IMPRESSION: No significant interval change from the prior exam. Electronically Signed   By: Alcide Clever M.D.   On: 12/16/2017 07:22        Scheduled Meds: . bictegravir-emtricitabine-tenofovir AF  1 tablet Oral Daily  . feeding supplement  1 Container Oral BID BM  . feeding supplement (ENSURE ENLIVE)  237 mL Oral BID BM  . folic acid  1 mg Oral Daily  . guaiFENesin  600 mg Oral BID  . metoprolol tartrate  12.5 mg Oral BID  . multivitamin with minerals  1 tablet Oral Daily  . nicotine  14 mg Transdermal Daily  . sodium chloride flush  5 mL Intracatheter Q8H  . thiamine  100 mg Oral Daily   Continuous Infusions: . ampicillin-sulbactam (UNASYN) IV 3 g (12/16/17 1342)  . dextrose 5 % and 0.9% NaCl 75 mL/hr at 12/16/17 0326     LOS: 9 days    Time spent: 25 mins.More than 50% of that time was spent in counseling and/or coordination of care.      Burnadette Pop, MD [facility] Pager [phone]  If 7PM-7AM, please contact night-coverage [email or web address] Password [facility] 12/16/2017, 2:57 PM

## 2017-12-16 NOTE — Progress Notes (Signed)
Referring Physician(s): Dr. Mariea Clonts  Supervising Physician: Irish Lack  Patient Status:  [facility] - In-pt  Chief Complaint: Follow-up right sided chest tube  Subjective:  67 year old male with past medical history significant for HIV, chronic hepatitis C, medical noncompliance, HTN, polysubstance abuse, history spontaneous pneumothorax. Patient presented 7/17 to [facility] c/o SHOB, cough and weight loss. He was found to have sepsis secondary to PNA with lung abscess and empyema. IR was consulted for chest tube placement which was placed on 7/19 by Dr. Miles Costain.  Patient reports he feels very good today although he is tired of eating watermelon. He is hopeful that he will be able to go to rehab because he is tired of laying in the bed.  Allergies: Patient has no known allergies.  Medications: Prior to Admission medications   Medication Sig Start Date End Date Taking? Authorizing Provider  lisinopril-hydrochlorothiazide (PRINZIDE,ZESTORETIC) 10-12.5 MG tablet Take 1 tablet daily by mouth. 02/04/17  Yes [provider]  meloxicam (MOBIC) 15 MG tablet Take 1 tablet (15 mg total) by mouth daily. 08/04/15  Yes Delano Metz, MD  VENTOLIN HFA [phone] Base) MCG/ACT inhaler Take 2 puffs 4 (four) times daily as needed by mouth for shortness of breath. 02/03/17  Yes [provider]  emtricitabine-tenofovir AF (DESCOVY) 200-25 MG tablet Take 1 tablet by mouth daily. Patient not taking: Reported on 12/07/2017 07/16/15   Gardiner Barefoot, MD  gabapentin (NEURONTIN) 100 MG capsule Take 1 capsule (100 mg total) by mouth every 8 (eight) hours. Patient not taking: Reported on 12/07/2017 08/04/15   Delano Metz, MD  Ledipasvir-Sofosbuvir (HARVONI) 90-400 MG TABS Take 1 tablet by mouth daily. Patient not taking: Reported on 12/07/2017 01/21/16   Gardiner Barefoot, MD     Vital Signs: BP (!) 130/94 (BP Location: Left Arm)   Pulse 91   Temp 98.8 F (37.1 C) (Oral)   Resp (!) 29   Ht  5\' 11"  (1.803 m)   Wt 146 lb 6.2 oz (66.4 kg)   SpO2 100%   BMI 20.42 kg/m   Physical Exam  Constitutional: He is oriented to person, place, and time. No distress.  HENT:  Head: Normocephalic.  Cardiovascular: Normal rate and regular rhythm.  Pulmonary/Chest: Effort normal and breath sounds normal. No respiratory distress. He has no wheezes.  Right sided chest tube in place; insertion site unremarkable. Pleurvac with some sediment in tubing.   Abdominal: Soft. Bowel sounds are normal. There is no tenderness.  Neurological: He is alert and oriented to person, place, and time.  Skin: Skin is warm and dry. No rash noted. He is not diaphoretic.  Psychiatric: He has a normal mood and affect. His behavior is normal. Judgment and thought content normal.    Imaging: Dg Chest Port 1 View  Result Date: 12/16/2017 CLINICAL DATA:  Follow-up chest tube EXAM: PORTABLE CHEST 1 VIEW COMPARISON:  12/14/2017 FINDINGS: Cardiac shadow is stable. Right-sided chest tube is again seen. Fluid is noted loculated within the minor fissure as well as inferiorly on the right. A small left pleural effusion is again seen and stable. No new focal infiltrate is noted. No bony abnormality is seen. IMPRESSION: No significant interval change from the prior exam. Electronically Signed   By: 12/16/2017 M.D.   On: 12/16/2017 07:22   Dg Chest Port 1 View  Result Date: 12/14/2017 CLINICAL DATA:  Dyspnea. EXAM: PORTABLE CHEST 1 VIEW COMPARISON:  12/12/2017. FINDINGS: Stable cardiomediastinal silhouette. Pigtail catheter RIGHT lower chest  is unchanged, along with asymmetric RIGHT lung opacity. No pneumothorax. IMPRESSION: Stable chest. Electronically Signed   By: Elsie Stain M.D.   On: 12/14/2017 07:17    Labs:  CBC: Recent Labs    12/10/17 0350 12/12/17 0701 12/14/17 0402 12/16/17 0525  WBC 21.9* 12.6* 11.2* 8.8  HGB 11.2* 10.1* 8.8* 10.2*  HCT 33.2* 30.9* 27.7* 34.2*  PLT 592* 445* 319 288    COAGS: Recent  Labs    12/07/17 1128  INR 1.26  APTT 39*    BMP: Recent Labs    12/13/17 1405 12/14/17 0402 12/15/17 0431 12/16/17 0525  NA 141 140 140 139  K 3.8 3.6 3.5 3.7  CL 111 111 110 109  CO2 24 26 27 24   GLUCOSE 91 121* 106* 106*  BUN 17 11 8  6*  CALCIUM 7.6* 7.5* 7.3* 7.1*  CREATININE 0.79 0.72 0.73 0.68  GFRNONAA >60 >60 >60 >60  GFRAA >60 >60 >60 >60    LIVER FUNCTION TESTS: Recent Labs    12/13/17 1405 12/14/17 0402 12/15/17 0431 12/16/17 0525  BILITOT 0.7 0.9 0.8 1.0  AST 868* 543* 294* 191*  ALT 726* 585* 390* 285*  ALKPHOS 92 86 86 77  PROT 5.6* 5.3* 5.5* 5.7*  ALBUMIN 1.6* 1.5* 1.5* 1.5*    Assessment and Plan:  Right sided empyema -  chest tube remains in place, drainage remains significant at 280 mL yesterday. Patient afebrile, appetite improved, participating in PT, denies SHOBB, WBC within normal limits today. Continues on IV abx. Plan per CT for pigtail drain to remain until output <60 mL per day. Will continue to follow along with CT surgery.    Electronically Signed: 12/17/17, PA-C 12/16/2017, 9:45 AM   I spent a total of 15 Minutes at the the patient's bedside AND on the patient's hospital floor or unit, greater than 50% of which was counseling/coordinating care for right sided pigtail chest tube s/p empyema.

## 2017-12-16 NOTE — Progress Notes (Signed)
During shift change pt complained saying he has a pressure ulcer on his bottom. I turned the patient and checked and the pt has a small new opening on his bottom similar to an abrasion. I educated the patient on the importance of getting up with physical therapy to which he has been refusing and the importance of turning and getting into the chair for meals. The patient has only gotten out of the bed with physical therapy one time out of the past 3 days. I placed a sacral pad on the patient and placed a pillow under the patient's right hip. I educated the patient once again on turning every 2 hours and the importance of preventing the wound from growing bigger. I then talked to his sisters who were concerned about the wound and educated them on the importance of also encouraging him to turn and work with physical therapy since they are with him the majority of the day. I will continue to encourage and educate the patient with turning.

## 2017-12-16 NOTE — Care Management Important Message (Signed)
Important Message  Patient Details  Name: Kevin Jimenez MRN: [id number] Date of Birth: 01/09/1951   Medicare Important Message Given:  Yes    Lachrisha Ziebarth P Narya Beavin 12/16/2017, 3:39 PM

## 2017-12-17 DIAGNOSIS — L8990 Pressure ulcer of unspecified site, unspecified stage: Secondary | ICD-10-CM

## 2017-12-17 LAB — COMPREHENSIVE METABOLIC PANEL
ALBUMIN: 1.4 g/dL — AB (ref 3.5–5.0)
ALK PHOS: 79 U/L (ref 38–126)
ALT: 219 U/L — AB (ref 0–44)
AST: 146 U/L — ABNORMAL HIGH (ref 15–41)
Anion gap: 6 (ref 5–15)
BUN: 5 mg/dL — ABNORMAL LOW (ref 8–23)
CALCIUM: 6.8 mg/dL — AB (ref 8.9–10.3)
CHLORIDE: 108 mmol/L (ref 98–111)
CO2: 24 mmol/L (ref 22–32)
CREATININE: 0.71 mg/dL (ref 0.61–1.24)
GFR calc non Af Amer: >60 mL/min (ref >60)
GLUCOSE: 109 mg/dL — AB (ref 70–99)
Potassium: 3.4 mmol/L — ABNORMAL LOW (ref 3.5–5.1)
Sodium: 138 mmol/L (ref 135–145)
Total Bilirubin: 0.8 mg/dL (ref 0.3–1.2)
Total Protein: 5.7 g/dL — ABNORMAL LOW (ref 6.5–8.1)

## 2017-12-17 MED ORDER — DILTIAZEM LOAD VIA INFUSION
15.0000 mg | Freq: Once | INTRAVENOUS | Status: AC
  Administered 2017-12-17: 15 mg via INTRAVENOUS
  Filled 2017-12-17: qty 15

## 2017-12-17 MED ORDER — METOPROLOL TARTRATE 5 MG/5ML IV SOLN
5.0000 mg | Freq: Four times a day (QID) | INTRAVENOUS | Status: DC | PRN
  Administered 2017-12-17: 5 mg via INTRAVENOUS
  Filled 2017-12-17 (×2): qty 5

## 2017-12-17 MED ORDER — DILTIAZEM HCL 25 MG/5ML IV SOLN
10.0000 mg | Freq: Once | INTRAVENOUS | Status: AC
  Administered 2017-12-17: 10 mg via INTRAVENOUS
  Filled 2017-12-17: qty 5

## 2017-12-17 MED ORDER — DILTIAZEM HCL-DEXTROSE 100-5 MG/100ML-% IV SOLN (PREMIX)
5.0000 mg/h | INTRAVENOUS | Status: DC
  Administered 2017-12-17: 5 mg/h via INTRAVENOUS
  Filled 2017-12-17: qty 100

## 2017-12-17 NOTE — Progress Notes (Addendum)
The heart rate is ST in the ~140. Notified Shahmehdi with [facility]; awaiting call back. I will continue to monitor the patient closely.   Sheppard Evens RN    Gave IV lopressor PRN from new order.   Post from PRN medication HR still in the ~140. Notified Bodenheimer. Awaiting call.   Sheppard Evens RN

## 2017-12-17 NOTE — Progress Notes (Signed)
PROGRESS NOTE    Kevin Jimenez  KPV:[id number] DOB: 02-10-1951 DOA: 12/07/2017 PCP: Fleet Contras, MD   Subjective:  Patient was seen and examined this morning, no acute distress, stable apparently reported to nursing staff overnight of developing small pressure ulcer on his bottom. He was advised to move his much as possible, he has been in bed only ambulates or moves with PT. Chest tube still in place, reporting a mild shortness of breath but no chest pain.   Brief Narrative: Patient is a 67 year old male with past medical history of HIV, chronic hepatitis C, noncompliant with Harvoni, hypertension, polysubstance abuse including heroin, tobacco abuse, history of a spontaneous pneumothorax who  is being managed for right-sided pneumonia with parapneumonic effusion and empyema.  He is status post right-sided chest tube placement.  He was admitted on 7/17 when he presented to the emergency department with complaints of weight loss, shortness of breath and cough.  Assessment & Plan:   Principal Problem:   Sepsis due to pneumonia [facility]) Active Problems:   History of substance abuse   HIV (+) Human immunodeficiency virus disease (HCC)   Tobacco abuse   Hyponatremia   Acute kidney injury (HCC)   Protein-calorie malnutrition, severe   Pressure injury of skin   Sepsis secondary to right-sided pneumonia,/lung abscess/empyema  -Large right-sided pleural effusion, status post chest tube placement still draining, -Cardiothoracic surgery following managing -IV antibiotics per ID  HIV: Noncompliant, infectious disease following, -Checking viral load and CD4 count Continue Biktarvy.    As per CT surgery, drain will be continued until output is less than 60 mL/day.  No recommendation of VATS.  Blood culture, urine culture and peripheral culture are all without growth so far.   Continue Unasyn as per ID  Started on 12/07/17.  Failure to thrive:  -Encourage p.o. intake, dietary  supplements -Continue Remeron   Elevated LFTs: -Monitoring, stable  -Hepatitis C versus side effect of Biktarvy.  Continue to monitor.    -Improving avoid hepatotoxic agents  AKI:  Resolved,  -monitoring BUN/creatinine    Hepatitis C: Noncompliant with  Harvoni.  Follow-up with ID as an outpatient.  Paroxysmal A. fib: Not a candidate for anticoagulation.  He is back in his normal sinus rhythm.  Continue metoprolol   DVT prophylaxis: SCD Code Status: DNR Family Communication: None present at the bedside Disposition Plan: Likely SNF after chest tube removal.SW following   Consultants: CT surgery, ID  Procedures:chest tube placement   Antimicrobials: Unasyn  Objective: Vitals:   12/16/17 1416 12/16/17 2050 12/17/17 0433 12/17/17 0933  BP: 131/90 138/87 (!) 151/88 (!) 144/96  Pulse: 87 97 95 98  Resp:  (!) 41 (!) 36   Temp: 98.3 F (36.8 C) 97.9 F (36.6 C) 98 F (36.7 C)   TempSrc: Oral Oral Oral   SpO2: 100% 93% 98%   Weight:   68.1 kg (150 lb 2.1 oz)   Height:        Intake/Output Summary (Last 24 hours) at 12/17/2017 1403 Last data filed at 12/17/2017 1300 Gross per 24 hour  Intake 3854.56 ml  Output 190 ml  Net 3664.56 ml   Filed Weights   12/15/17 0500 12/16/17 0504 12/17/17 0433  Weight: 58.8 kg (129 lb 10.1 oz) 66.4 kg (146 lb 6.2 oz) 68.1 kg (150 lb 2.1 oz)   BP (!) 144/96   Pulse 98   Temp 98 F (36.7 C) (Oral)   Resp (!) 36   Ht 5\' 11"  (1.803 m)  Wt 68.1 kg (150 lb 2.1 oz)   SpO2 98%   BMI 20.94 kg/m    Physical Exam  Constitution:  Alert, cooperative, no distress,  Psychiatric: Normal and stable mood and affect, cognition intact,   HEENT: Normocephalic, PERRL, otherwise with in Normal limits  Cardio vascular:  S1/S2, RRR, No murmure, No Rubs or Gallops  Chest/pulmonary: Chest tube in place on her right Abdomen: Soft, non-tender, non-distended, bowel sounds,no masses, no organomegaly Muscular skeletal: Limited exam - in bed, able to  move all 4 extremities, Normal strength,  Neuro: CNII-XII intact. , normal motor and sensation, reflexes intact  Extremities: No pitting edema lower extremities, +2 pulses  Skin: Dry, warm to touch, negative for any Rashes,  Pressure ulcer per nursing documentation    Data Reviewed: I have personally reviewed following labs and imaging studies  CBC: Recent Labs  Lab 12/12/17 0701 12/14/17 0402 12/16/17 0525  WBC 12.6* 11.2* 8.8  NEUTROABS  --   --  6.7  HGB 10.1* 8.8* 10.2*  HCT 30.9* 27.7* 34.2*  MCV 99.7 101.8* 108.2*  PLT 445* 319 288   Basic Metabolic Panel: Recent Labs  Lab 12/13/17 1405 12/14/17 0402 12/15/17 0431 12/16/17 0525 12/17/17 0452  NA 141 140 140 139 138  K 3.8 3.6 3.5 3.7 3.4*  CL 111 111 110 109 108  CO2 24 26 27 24 24   GLUCOSE 91 121* 106* 106* 109*  BUN 17 11 8  6* 5*  CREATININE 0.79 0.72 0.73 0.68 0.71  CALCIUM 7.6* 7.5* 7.3* 7.1* 6.8*   GFR: Estimated Creatinine Clearance: 86.3 mL/min (by C-G formula based on SCr of 0.71 mg/dL). Liver Function Tests: Recent Labs  Lab 12/13/17 1405 12/14/17 0402 12/15/17 0431 12/16/17 0525 12/17/17 0452  AST 868* 543* 294* 191* 146*  ALT 726* 585* 390* 285* 219*  ALKPHOS 92 86 86 77 79  BILITOT 0.7 0.9 0.8 1.0 0.8  PROT 5.6* 5.3* 5.5* 5.7* 5.7*  ALBUMIN 1.6* 1.5* 1.5* 1.5* 1.4*    Recent Results (from the past 240 hour(s))  Body fluid culture     Status: None   Collection Time: 12/09/17  6:28 PM  Result Value Ref Range Status   Specimen Description FLUID RIGHT PLEURAL  Final   Special Requests NONE  Final   Gram Stain   Final    MODERATE WBC PRESENT, PREDOMINANTLY PMN NO ORGANISMS SEEN    Culture   Final    NO GROWTH 3 DAYS Performed at [facility], [address], [place], [address] [place]    Report Status 12/13/2017 FINAL  Final         Radiology Studies: Dg Chest Port 1 View  Result Date: 12/16/2017 CLINICAL DATA:  Follow-up chest tube EXAM: PORTABLE CHEST 1 VIEW  COMPARISON:  12/14/2017 FINDINGS: Cardiac shadow is stable. Right-sided chest tube is again seen. Fluid is noted loculated within the minor fissure as well as inferiorly on the right. A small left pleural effusion is again seen and stable. No new focal infiltrate is noted. No bony abnormality is seen. IMPRESSION: No significant interval change from the prior exam. Electronically Signed   By: 12/18/2017 M.D.   On: 12/16/2017 07:22        Scheduled Meds: . bictegravir-emtricitabine-tenofovir AF  1 tablet Oral Daily  . feeding supplement  1 Container Oral BID BM  . feeding supplement (ENSURE ENLIVE)  237 mL Oral BID BM  . folic acid  1 mg Oral Daily  . guaiFENesin  600 mg Oral BID  . metoprolol tartrate  12.5 mg Oral BID  . multivitamin with minerals  1 tablet Oral Daily  . nicotine  14 mg Transdermal Daily  . sodium chloride flush  5 mL Intracatheter Q8H  . thiamine  100 mg Oral Daily   Continuous Infusions: . ampicillin-sulbactam (UNASYN) IV Stopped (12/17/17 6834)  . dextrose 5 % and 0.9% NaCl 75 mL/hr at 12/17/17 1200     LOS: 10 days    Time spent: 25 mins.More than 50% of that time was spent in counseling and/or coordination of care.   Kendell Bane, MD [facility] Pager [phone]  If 7PM-7AM, please contact night-coverage [email or web address] Password [facility] 12/17/2017, 2:03 PM

## 2017-12-18 DIAGNOSIS — R000 Tachycardia, unspecified: Secondary | ICD-10-CM | POA: Diagnosis not present

## 2017-12-18 LAB — COMPREHENSIVE METABOLIC PANEL
ALBUMIN: 1.5 g/dL — AB (ref 3.5–5.0)
ALK PHOS: 91 U/L (ref 38–126)
ALT: 181 U/L — AB (ref 0–44)
ANION GAP: 8 (ref 5–15)
AST: 125 U/L — ABNORMAL HIGH (ref 15–41)
BUN: 6 mg/dL — ABNORMAL LOW (ref 8–23)
CALCIUM: 6.8 mg/dL — AB (ref 8.9–10.3)
CHLORIDE: 109 mmol/L (ref 98–111)
CO2: 22 mmol/L (ref 22–32)
CREATININE: 0.64 mg/dL (ref 0.61–1.24)
GFR calc Af Amer: >60 mL/min (ref >60)
GFR calc non Af Amer: >60 mL/min (ref >60)
GLUCOSE: 116 mg/dL — AB (ref 70–99)
Potassium: 3.2 mmol/L — ABNORMAL LOW (ref 3.5–5.1)
SODIUM: 139 mmol/L (ref 135–145)
Total Bilirubin: 0.8 mg/dL (ref 0.3–1.2)
Total Protein: 6 g/dL — ABNORMAL LOW (ref 6.5–8.1)

## 2017-12-18 LAB — HIV-1 RNA QUANT-NO REFLEX-BLD
HIV 1 RNA Quant: <20 copies/mL
LOG10 HIV-1 RNA: UNDETERMINED {Log_copies}/mL

## 2017-12-18 LAB — TSH: TSH: 3.004 u[IU]/mL (ref 0.350–4.500)

## 2017-12-18 LAB — TROPONIN I
Troponin I: 0.05 ng/mL (ref <0.03)
Troponin I: 0.06 ng/mL (ref <0.03)
Troponin I: 0.05 ng/mL (ref <0.03)

## 2017-12-18 LAB — MAGNESIUM: Magnesium: 1 mg/dL — ABNORMAL LOW (ref 1.7–2.4)

## 2017-12-18 MED ORDER — DILTIAZEM HCL 60 MG PO TABS
60.0000 mg | ORAL_TABLET | Freq: Three times a day (TID) | ORAL | Status: DC
  Administered 2017-12-18: 60 mg via ORAL
  Filled 2017-12-18: qty 1

## 2017-12-18 MED ORDER — MAGNESIUM SULFATE 2 GM/50ML IV SOLN
2.0000 g | Freq: Once | INTRAVENOUS | Status: AC
  Administered 2017-12-18: 2 g via INTRAVENOUS
  Filled 2017-12-18: qty 50

## 2017-12-18 MED ORDER — DILTIAZEM HCL ER COATED BEADS 180 MG PO CP24
180.0000 mg | ORAL_CAPSULE | Freq: Every day | ORAL | Status: DC
  Administered 2017-12-19 – 2017-12-20 (×2): 180 mg via ORAL
  Filled 2017-12-18 (×2): qty 1

## 2017-12-18 MED ORDER — GERHARDT'S BUTT CREAM
TOPICAL_CREAM | Freq: Two times a day (BID) | CUTANEOUS | Status: DC
  Administered 2017-12-18 – 2017-12-19 (×2): via TOPICAL
  Administered 2017-12-20: 1 via TOPICAL
  Administered 2017-12-22 – 2017-12-24 (×6): via TOPICAL
  Administered 2017-12-25: 1 via TOPICAL
  Administered 2017-12-25 – 2017-12-27 (×3): via TOPICAL
  Filled 2017-12-18: qty 1

## 2017-12-18 MED ORDER — METOPROLOL TARTRATE 50 MG PO TABS
50.0000 mg | ORAL_TABLET | Freq: Two times a day (BID) | ORAL | Status: DC
  Administered 2017-12-18 – 2017-12-20 (×5): 50 mg via ORAL
  Filled 2017-12-18 (×5): qty 1

## 2017-12-18 MED ORDER — TRAZODONE HCL 100 MG PO TABS
100.0000 mg | ORAL_TABLET | Freq: Every day | ORAL | Status: DC
  Administered 2017-12-18 – 2017-12-27 (×10): 100 mg via ORAL
  Filled 2017-12-18 (×10): qty 1

## 2017-12-18 MED ORDER — DILTIAZEM HCL ER COATED BEADS 240 MG PO CP24
240.0000 mg | ORAL_CAPSULE | Freq: Every day | ORAL | Status: DC
  Administered 2017-12-18: 240 mg via ORAL
  Filled 2017-12-18: qty 1

## 2017-12-18 MED ORDER — POTASSIUM CHLORIDE CRYS ER 20 MEQ PO TBCR
40.0000 meq | EXTENDED_RELEASE_TABLET | Freq: Once | ORAL | Status: AC
  Administered 2017-12-18: 40 meq via ORAL
  Filled 2017-12-18: qty 2

## 2017-12-18 NOTE — Progress Notes (Signed)
PROGRESS NOTE    Kevin Jimenez  XAJ:[id number] DOB: May 04, 1951 DOA: 12/07/2017 PCP: Fleet Contras, MD   Subjective:  Patient was seen and examined this morning, stable heart rate has improved.  Reporting he is not sleeping well he has not slept for the past 3 days.  Yesterday evening overnight patient has had a persistent tachycardia, per nursing staff documentation patient was given IV metoprolol, bolus Cardizem, was then started on Cardizem drip for few hours. Heart rate slowly improved.  Nursing documentation heart rate reached up to 140s, this morning 83 ------------------------------------------------------------------------------------------------------------------------------------------------------  Brief Narrative: Patient is a 67 year old male with past medical history of HIV, chronic hepatitis C, noncompliant with Harvoni, hypertension, polysubstance abuse including heroin, tobacco abuse, history of a spontaneous pneumothorax who  is being managed for right-sided pneumonia with parapneumonic effusion and empyema.  He is status post right-sided chest tube placement.  He was admitted on 7/17 when he presented to the emergency department with complaints of weight loss, shortness of breath and cough.  Assessment & Plan:   Principal Problem:   Sepsis due to pneumonia [facility]) Active Problems:   History of substance abuse   HIV (+) Human immunodeficiency virus disease (HCC)   Tobacco abuse   Hyponatremia   Acute kidney injury (HCC)   Protein-calorie malnutrition, severe   Pressure injury of skin   Tachycardia   She of paroxysmal atrial for ablation/persistent tachycardia -Overnight per nurse's documentation patient had a persistent tachycardia, did not respond to 10 mg IV Cardizem, IV metoprolol, subsequently with bolus Cardizem was given and patient was initiated on few hours of Cardizem Heart rate has improved, was initiated p.o. Cardizem every 6 hours this morning was  switched to long-acting Cardizem of 240 mg p.o. Daily -We will follow with labs, cardiac enzymes, TSH, EKG, -According to the history and electronic medical record patient is not a candidate for chronic anticoagulation, especially now due to comorbidities, noncompliance, empyema   Sepsis secondary to right-sided pneumonia,/lung abscess/empyema  -Large right-sided pleural effusion, status post chest tube placement still draining, -Cardiothoracic surgery following managing -IV antibiotics per ID  HIV: Noncompliant, infectious disease following, -Checking viral load and CD4 count Continue Biktarvy.    As per CT surgery, drain will be continued until output is less than 60 mL/day.  No recommendation of VATS.  Blood culture, urine culture and peripheral culture are all without growth so far.   Continue Unasyn as per ID  Started on 12/07/17.  Failure to thrive:  -Encourage p.o. intake, dietary supplements -Continue Remeron   Elevated LFTs: -Monitoring, stable  -Hepatitis C versus side effect of Biktarvy.  Continue to monitor.    -Improving avoid hepatotoxic agents  AKI:  Resolved,  -monitoring BUN/creatinine   Hepatitis C: Noncompliant with  Harvoni.  Follow-up with ID as an outpatient.  Insomnia -We will initiate nightly trazodone, monitor closely   DVT prophylaxis: SCD Code Status: DNR Family Communication: None present at the bedside Disposition Plan: Likely SNF after chest tube removal.SW following   Consultants: CT surgery, ID  Procedures:chest tube placement  Antimicrobials: Unasyn  Objective: Vitals:   12/17/17 2029 12/17/17 2148 12/18/17 0101 12/18/17 0507  BP: 120/77 127/85 (!) 139/98 127/87  Pulse: 98 77 81 83  Resp: 20   (!) 34  Temp: 99.1 F (37.3 C)   98.5 F (36.9 C)  TempSrc: Oral   Oral  SpO2: 97%   95%  Weight:    71.4 kg (157 lb 6.5 oz)  Height:  Intake/Output Summary (Last 24 hours) at 12/18/2017 1126 Last data filed at 12/18/2017  0538 Gross per 24 hour  Intake 3734.56 ml  Output 140 ml  Net 3594.56 ml   Filed Weights   12/16/17 0504 12/17/17 0433 12/18/17 0507  Weight: 66.4 kg (146 lb 6.2 oz) 68.1 kg (150 lb 2.1 oz) 71.4 kg (157 lb 6.5 oz)   BP 127/87 (BP Location: Right Arm)   Pulse 83   Temp 98.5 F (36.9 C) (Oral)   Resp (!) 34   Ht 5\' 11"  (1.803 m)   Wt 71.4 kg (157 lb 6.5 oz)   SpO2 95%   BMI 21.95 kg/m     BP 127/87 (BP Location: Right Arm)   Pulse 83   Temp 98.5 F (36.9 C) (Oral)   Resp (!) 34   Ht 5\' 11"  (1.803 m)   Wt 71.4 kg (157 lb 6.5 oz)   SpO2 95%   BMI 21.95 kg/m    Physical Exam  Constitution:  Alert, cooperative, no distress,  Psychiatric: Normal and stable mood and affect, cognition intact,   HEENT: Normocephalic, PERRL, otherwise with in Normal limits  Cardio vascular:  S1/S2, RRR, No murmure, No Rubs or Gallops  Chest/pulmonary: Chest tube present on the right flank area, reduced breath sounds right lower lobe otherwise clear to auscultation bilaterally, respirations unlabored  Chest symmetric Abdomen: Soft, non-tender, non-distended, bowel sounds,no masses, no organomegaly Muscular skeletal: Limited exam - in bed, able to move all 4 extremities, Normal strength,  Neuro: CNII-XII intact. , normal motor and sensation, reflexes intact  Extremities: No pitting edema lower extremities, +2 pulses  Skin: Dry, warm to touch, negative for any Rashes, No open wounds  pressure ulcer per nursing documentation     Data Reviewed: I have personally reviewed following labs and imaging studies  CBC: Recent Labs  Lab 12/12/17 0701 12/14/17 0402 12/16/17 0525  WBC 12.6* 11.2* 8.8  NEUTROABS  --   --  6.7  HGB 10.1* 8.8* 10.2*  HCT 30.9* 27.7* 34.2*  MCV 99.7 101.8* 108.2*  PLT 445* 319 288   Basic Metabolic Panel: Recent Labs  Lab 12/14/17 0402 12/15/17 0431 12/16/17 0525 12/17/17 0452 12/18/17 0800  NA 140 140 139 138 139  K 3.6 3.5 3.7 3.4* 3.2*  CL 111 110 109  108 109  CO2 26 27 24 24 22   GLUCOSE 121* 106* 106* 109* 116*  BUN 11 8 6* 5* 6*  CREATININE 0.72 0.73 0.68 0.71 0.64  CALCIUM 7.5* 7.3* 7.1* 6.8* 6.8*  MG  --   --   --   --  1.0*   GFR: Estimated Creatinine Clearance: 90.5 mL/min (by C-G formula based on SCr of 0.64 mg/dL). Liver Function Tests: Recent Labs  Lab 12/14/17 0402 12/15/17 0431 12/16/17 0525 12/17/17 0452 12/18/17 0800  AST 543* 294* 191* 146* 125*  ALT 585* 390* 285* 219* 181*  ALKPHOS 86 86 77 79 91  BILITOT 0.9 0.8 1.0 0.8 0.8  PROT 5.3* 5.5* 5.7* 5.7* 6.0*  ALBUMIN 1.5* 1.5* 1.5* 1.4* 1.5*    Recent Results (from the past 240 hour(s))  Body fluid culture     Status: None   Collection Time: 12/09/17  6:28 PM  Result Value Ref Range Status   Specimen Description FLUID RIGHT PLEURAL  Final   Special Requests NONE  Final   Gram Stain   Final    MODERATE WBC PRESENT, PREDOMINANTLY PMN NO ORGANISMS SEEN    Culture  Final    NO GROWTH 3 DAYS Performed at [facility], [address], [place], [place] [address]    Report Status 12/13/2017 FINAL  Final     Radiology Studies: No results found.  Scheduled Meds: . bictegravir-emtricitabine-tenofovir AF  1 tablet Oral Daily  . diltiazem  240 mg Oral Daily  . feeding supplement  1 Container Oral BID BM  . feeding supplement (ENSURE ENLIVE)  237 mL Oral BID BM  . folic acid  1 mg Oral Daily  . guaiFENesin  600 mg Oral BID  . metoprolol tartrate  12.5 mg Oral BID  . multivitamin with minerals  1 tablet Oral Daily  . nicotine  14 mg Transdermal Daily  . sodium chloride flush  5 mL Intracatheter Q8H  . thiamine  100 mg Oral Daily  . traZODone  100 mg Oral QHS   Continuous Infusions: . ampicillin-sulbactam (UNASYN) IV 3 g (12/18/17 0533)  . dextrose 5 % and 0.9% NaCl 75 mL/hr at 12/17/17 1200     LOS: 11 days    Time spent: 25 mins.More than 50% of that time was spent in counseling and/or coordination of care.   Kendell Bane, MD [facility] Pager [phone]  If 7PM-7AM, please contact night-coverage [email or web address] Password [facility] 12/18/2017, 11:26 AM

## 2017-12-18 NOTE — Consult Note (Signed)
WOC Nurse wound consult note Reason for Consult:Stage 2 pressure injury to sacrum,  Wound type:stage 2 pressure Pressure Injury POA: No Measurement: 2 cm x 3 cm x 0.2 cm  Wound MKL:KJZP pink and moist Drainage (amount, consistency, odor) scant serosanguinous Periwound:intact Dressing procedure/placement/frequency:Cleanse sacral wound with NS.  Apply Gerhardts paste twice daily and PRN soilage.  Will not follow at this time.  Please re-consult if needed.  Maple Hudson RN BSN CWON Pager [phone]

## 2017-12-19 DIAGNOSIS — J90 Pleural effusion, not elsewhere classified: Secondary | ICD-10-CM

## 2017-12-19 DIAGNOSIS — L89152 Pressure ulcer of sacral region, stage 2: Secondary | ICD-10-CM

## 2017-12-19 DIAGNOSIS — R64 Cachexia: Secondary | ICD-10-CM

## 2017-12-19 DIAGNOSIS — J869 Pyothorax without fistula: Secondary | ICD-10-CM

## 2017-12-19 DIAGNOSIS — Z6821 Body mass index (BMI) 21.0-21.9, adult: Secondary | ICD-10-CM

## 2017-12-19 LAB — T-HELPER CELLS (CD4) COUNT (NOT AT ARMC)
CD4 % Helper T Cell: 32 % — ABNORMAL LOW (ref 33–55)
CD4 T Cell Abs: 330 /uL — ABNORMAL LOW (ref 400–2700)

## 2017-12-19 LAB — CBC
HEMATOCRIT: 29.2 % — AB (ref 39.0–52.0)
Hemoglobin: 9.1 g/dL — ABNORMAL LOW (ref 13.0–17.0)
MCH: 32.2 pg (ref 26.0–34.0)
MCHC: 31.2 g/dL (ref 30.0–36.0)
MCV: 103.2 fL — ABNORMAL HIGH (ref 78.0–100.0)
PLATELETS: 258 10*3/uL (ref 150–400)
RBC: 2.83 MIL/uL — ABNORMAL LOW (ref 4.22–5.81)
RDW: 12.9 % (ref 11.5–15.5)
WBC: 7.9 10*3/uL (ref 4.0–10.5)

## 2017-12-19 LAB — COMPREHENSIVE METABOLIC PANEL
ALT: 137 U/L — ABNORMAL HIGH (ref 0–44)
AST: 97 U/L — ABNORMAL HIGH (ref 15–41)
Albumin: 1.3 g/dL — ABNORMAL LOW (ref 3.5–5.0)
Alkaline Phosphatase: 81 U/L (ref 38–126)
Anion gap: 5 (ref 5–15)
BILIRUBIN TOTAL: 0.5 mg/dL (ref 0.3–1.2)
CO2: 25 mmol/L (ref 22–32)
Calcium: 6.8 mg/dL — ABNORMAL LOW (ref 8.9–10.3)
Chloride: 110 mmol/L (ref 98–111)
Creatinine, Ser: 0.61 mg/dL (ref 0.61–1.24)
Glucose, Bld: 126 mg/dL — ABNORMAL HIGH (ref 70–99)
POTASSIUM: 3.7 mmol/L (ref 3.5–5.1)
Sodium: 140 mmol/L (ref 135–145)
Total Protein: 5.6 g/dL — ABNORMAL LOW (ref 6.5–8.1)

## 2017-12-19 LAB — MAGNESIUM: MAGNESIUM: 1.2 mg/dL — AB (ref 1.7–2.4)

## 2017-12-19 NOTE — Progress Notes (Addendum)
CSW continuing to follow for medical readiness and will support with disposition planning. CSW provided SNF bed offers to patient; will follow up for choice.  Abigail Butts, LCSWA [phone]

## 2017-12-19 NOTE — Progress Notes (Signed)
Referring Physician(s): Dr Maren Beach  Supervising Physician: Richarda Overlie  Patient Status:  [facility] - In-pt  Chief Complaint:  Rt empyema  Subjective:  Right chest tube drain placed 12/09/17 Pt is really feeling better daily 900 cc in pleurvac now (130 cc in 24 hrs per VT note)   Allergies: Patient has no known allergies.  Medications: Prior to Admission medications   Medication Sig Start Date End Date Taking? Authorizing Provider  lisinopril-hydrochlorothiazide (PRINZIDE,ZESTORETIC) 10-12.5 MG tablet Take 1 tablet daily by mouth. 02/04/17  Yes [provider]  meloxicam (MOBIC) 15 MG tablet Take 1 tablet (15 mg total) by mouth daily. 08/04/15  Yes Delano Metz, MD  VENTOLIN HFA 108 [phone] Base) MCG/ACT inhaler Take 2 puffs 4 (four) times daily as needed by mouth for shortness of breath. 02/03/17  Yes [provider]  emtricitabine-tenofovir AF (DESCOVY) 200-25 MG tablet Take 1 tablet by mouth daily. Patient not taking: Reported on 12/07/2017 07/16/15   Gardiner Barefoot, MD  gabapentin (NEURONTIN) 100 MG capsule Take 1 capsule (100 mg total) by mouth every 8 (eight) hours. Patient not taking: Reported on 12/07/2017 08/04/15   Delano Metz, MD  Ledipasvir-Sofosbuvir (HARVONI) 90-400 MG TABS Take 1 tablet by mouth daily. Patient not taking: Reported on 12/07/2017 01/21/16   Gardiner Barefoot, MD     Vital Signs: BP 133/89 (BP Location: Right Arm)   Pulse 80   Temp 98.2 F (36.8 C) (Oral)   Resp (!) 42   Ht 5\' 11"  (1.803 m)   Wt 157 lb 6.5 oz (71.4 kg)   SpO2 98%   BMI 21.95 kg/m   Physical Exam  Constitutional: He is oriented to person, place, and time.  Pulmonary/Chest: Effort normal. He has wheezes.  Abdominal: Soft.  Musculoskeletal: Normal range of motion.  Neurological: He is alert and oriented to person, place, and time.  Skin: Skin is warm and dry.  Site of right chest tube in clean and dry NT no bleeding IOP 900 cc in pleurvac NO  airleak OP serous color  Vitals reviewed.   Imaging: Dg Chest Port 1 View  Result Date: 12/16/2017 CLINICAL DATA:  Follow-up chest tube EXAM: PORTABLE CHEST 1 VIEW COMPARISON:  12/14/2017 FINDINGS: Cardiac shadow is stable. Right-sided chest tube is again seen. Fluid is noted loculated within the minor fissure as well as inferiorly on the right. A small left pleural effusion is again seen and stable. No new focal infiltrate is noted. No bony abnormality is seen. IMPRESSION: No significant interval change from the prior exam. Electronically Signed   By: 12/16/2017 M.D.   On: 12/16/2017 07:22    Labs:  CBC: Recent Labs    12/12/17 0701 12/14/17 0402 12/16/17 0525 12/19/17 0442  WBC 12.6* 11.2* 8.8 7.9  HGB 10.1* 8.8* 10.2* 9.1*  HCT 30.9* 27.7* 34.2* 29.2*  PLT 445* 319 288 258    COAGS: Recent Labs    12/07/17 1128  INR 1.26  APTT 39*    BMP: Recent Labs    12/16/17 0525 12/17/17 0452 12/18/17 0800 12/19/17 0442  NA 139 138 139 140  K 3.7 3.4* 3.2* 3.7  CL 109 108 109 110  CO2 24 24 22 25   GLUCOSE 106* 109* 116* 126*  BUN 6* 5* 6* <5*  CALCIUM 7.1* 6.8* 6.8* 6.8*  CREATININE 0.68 0.71 0.64 0.61  GFRNONAA >60 >60 >60 >60  GFRAA >60 >60 >60 >60    LIVER FUNCTION TESTS: Recent Labs  12/16/17 0525 12/17/17 0452 12/18/17 0800 12/19/17 0442  BILITOT 1.0 0.8 0.8 0.5  AST 191* 146* 125* 97*  ALT 285* 219* 181* 137*  ALKPHOS 77 79 91 81  PROT 5.7* 5.7* 6.0* 5.6*  ALBUMIN 1.5* 1.4* 1.5* 1.3*    Assessment and Plan:  CXR pending today Water seal per DR VanTrigt Plan to continue Chest tube til OP less than 60 cc/24 hrs per Dr Maren Beach Will follow  Electronically Signed: Robet Leu, PA-C 12/19/2017, 9:41 AM   I spent a total of 15 Minutes at the the patient's bedside AND on the patient's hospital floor or unit, greater than 50% of which was counseling/coordinating care for right chest tube drain

## 2017-12-19 NOTE — Progress Notes (Signed)
Nutrition Follow-up  DOCUMENTATION CODES:   Severe malnutrition in context of chronic illness, Underweight  INTERVENTION:    Continue Ensure Enlive po BID, each supplement provides 350 kcal and 20 grams of protein  Continue Boost Breeze po BID, each supplement provides 250 kcal and 9 grams of protein  Continue Magic cup TID with meals, each supplement provides 290 kcal and 9 grams of protein  Continue multivitamin daily  Consider feeding tube placement (Cortrak team available M-W-F-Sat).   NUTRITION DIAGNOSIS:   Severe Malnutrition related to chronic illness(HIV) as evidenced by severe fat depletion, severe muscle depletion.  Ongoing  GOAL:   Patient will meet greater than or equal to 90% of their needs  Unmet  MONITOR:   PO intake, Supplement acceptance  ASSESSMENT:   67 yo male with PMH of substance abuse, tobacco dependence, HTN, HIV (not on antivirals for several months), COPD, and chronic hepatitis C who was admitted on 7/17 with PNA, small left pleural effusion, large loculated right pleural effusion.  Patient reports ongoing poor appetite. RN says patient continues to eat poorly. Lunch tray at bedside, untouched. He did have half of an Ensure supplement this morning. He is receiving magic cups on all meal trays. Encouraged patient to eat well and take supplements. He likes the orange flavored magic cups. Labs and medications reviewed.  New stage II pressure injury to coccyx, WOC RN following.  Patient has been consuming < 25% of meals on average. Patient has not been meeting nutrition needs since admission; severe protein calorie malnutrition is ongoing.  Weight is up significantly. I/O + 16 L since admission.  Diet Order:   Diet Order           Diet regular Room service appropriate? Yes; Fluid consistency: Thin  Diet effective now          EDUCATION NEEDS:   No education needs have been identified at this time  Skin:  Skin Assessment: Skin Integrity  Issues: Skin Integrity Issues:: Stage II Stage II: coccyx  Last BM:  7/27  Height:   Ht Readings from Last 1 Encounters:  12/07/17 5\' 11"  (1.803 m)    Weight:   Wt Readings from Last 1 Encounters:  12/18/17 157 lb 6.5 oz (71.4 kg)   12/10/17 132 lb 0.9 oz (59.9 kg)   12/08/17 122 lb 12.7 oz (55.7 kg)    Ideal Body Weight:  78.2 kg  BMI:  Body mass index is 21.95 kg/m.  Estimated Nutritional Needs:   Kcal:  1900-2100  Protein:  100-115 gm  Fluid:  1.9-2.1 L    12/10/17, RD, LDN, CNSC Pager [phone] After Hours Pager [phone]

## 2017-12-19 NOTE — Progress Notes (Signed)
PT Cancellation Note  Patient Details Name: Kevin Jimenez MRN: [id number] DOB: [birth year]   Cancelled Treatment:    Reason Eval/Treat Not Completed: Patient declined, no reason specified Pt reports he hasn't slept and that he is too tired for any activity. PT educated on the importance of moving given developing pressure sore, but pt continued to refuse. Will follow up as schedule allows  Demetria Pore, S-DPT Acute Care Rehab Student [phone]   12/19/2017, 1:37 PM

## 2017-12-19 NOTE — Progress Notes (Signed)
  Subjective: 130 cc chest tube output leave pigtail in place to water seal CXR in am Objective: Vital signs in last 24 hours: Temp:  [98.1 F (36.7 C)-98.2 F (36.8 C)] 98.2 F (36.8 C) (07/29 0639) Pulse Rate:  [80-99] 80 (07/29 0639) Cardiac Rhythm: Normal sinus rhythm (07/29 0748) Resp:  [30-42] 42 (07/29 0639) BP: (131-139)/(88-97) 133/89 (07/29 0639) SpO2:  [96 %-98 %] 98 % (07/29 0639)  Hemodynamic parameters for last 24 hours:    Intake/Output from previous day: 07/28 0701 - 07/29 0700 In: 3085.1 [P.O.:960; I.V.:1675.1; IV Piggyback:450] Out: 2090 [Urine:1950; Chest Tube:140] Intake/Output this shift: No intake/output data recorded.    Lab Results: Recent Labs    12/19/17 0442  WBC 7.9  HGB 9.1*  HCT 29.2*  PLT 258   BMET:  Recent Labs    12/18/17 0800 12/19/17 0442  NA 139 140  K 3.2* 3.7  CL 109 110  CO2 22 25  GLUCOSE 116* 126*  BUN 6* <5*  CREATININE 0.64 0.61  CALCIUM 6.8* 6.8*    PT/INR: No results for input(s): LABPROT, INR in the last 72 hours. ABG    Component Value Date/Time   PHART 7.405 12/10/2017 0610   HCO3 19.0 (L) 12/10/2017 0610   ACIDBASEDEF 4.9 (H) 12/10/2017 0610   O2SAT 96.2 12/10/2017 0610   CBG (last 3)  No results for input(s): GLUCAP in the last 72 hours.  Assessment/Plan: S/P R IR pigtail Leave in place until output minimal < 60 ml    LOS: 12 days    Kathlee Nations Trigt III 12/19/2017

## 2017-12-19 NOTE — Progress Notes (Signed)
[facility]  Date of Admission:  12/07/2017     Total days of antibiotics 14         ASSESSMENT/PLAN  Kevin Jimenez is a 67 y/o male with previous medical history of HIV, polysubstance abuse, hypertension, COPD, and Hepatitis C admitted to the hospital on 12/07/17 with the chief complaint of generalized weakness and cough and found to have a lung abscess and empyema.Status post chest tube for drainage.   Empyema - Afebrile with no leukocytosis. Continues to have drainage now at 140. Currently on Day 14 of antimicrobial therapy with Unasyn and tolerating with no adverse side effects. Continue chest tube per CVTS and continue current dosage of Unasyn.   AIDS - Stable with no symptoms of opportunistic infection. Viral load undetectable on 7/27 with CD4 pending. . No adverse side effects of Biktarvy. Will continue current dosage of Biktarvy.   Stage 2 sacral pressure ulcer - Continue wound care per Wound RN recommendations. No current evidence of infection.    Principal Problem:   Sepsis due to pneumonia [facility]) Active Problems:   History of substance abuse   HIV (+) Human immunodeficiency virus disease (HCC)   Tobacco abuse   Hyponatremia   Acute kidney injury (HCC)   Protein-calorie malnutrition, severe   Pressure injury of skin   Tachycardia   . bictegravir-emtricitabine-tenofovir AF  1 tablet Oral Daily  . diltiazem  180 mg Oral Daily  . feeding supplement  1 Container Oral BID BM  . feeding supplement (ENSURE ENLIVE)  237 mL Oral BID BM  . folic acid  1 mg Oral Daily  . Gerhardt's butt cream   Topical BID  . guaiFENesin  600 mg Oral BID  . metoprolol tartrate  50 mg Oral BID  . multivitamin with minerals  1 tablet Oral Daily  . nicotine  14 mg Transdermal Daily  . sodium chloride flush  5 mL Intracatheter Q8H  . thiamine  100 mg Oral Daily  . traZODone  100 mg Oral QHS    SUBJECTIVE:  Afebrile overnight with no leukocytosis. Drain continues  with output of 140 in 24 hour period per nursing notes. Now with Stage 2 pressure ulcer evaluated by Wound RN with no evidence of infection.   Continues to feel well with occasional shortness of breath.   No Known Allergies   Review of Systems: Review of Systems  Constitutional: Negative for chills, fever and malaise/fatigue.  Respiratory: Negative for cough, sputum production, shortness of breath and wheezing.   Cardiovascular: Negative for chest pain, palpitations and leg swelling.  Gastrointestinal: Negative for abdominal pain, constipation, diarrhea, nausea and vomiting.  Skin: Negative for rash.    OBJECTIVE: Vitals:   12/18/17 1438 12/18/17 1440 12/18/17 2057 12/19/17 0639  BP:  131/88 (!) 139/97 133/89  Pulse:  81 99 80  Resp:  (!) 30 (!) 42 (!) 42  Temp: 98.1 F (36.7 C)  98.1 F (36.7 C) 98.2 F (36.8 C)  TempSrc: Oral  Oral Oral  SpO2:  97% 96% 98%  Weight:      Height:       Body mass index is 21.95 kg/m.  Physical Exam  Constitutional: He is oriented to person, place, and time. He appears cachectic. No distress.  Cardiovascular: Normal rate, regular rhythm, normal heart sounds and intact distal pulses.  Pulmonary/Chest: Effort normal and breath sounds normal.  Neurological: He is alert and oriented to person, place, and time.  Skin: Skin is warm  and dry.  Psychiatric: He has a normal mood and affect. His behavior is normal. Judgment and thought content normal.    Lab Results Lab Results  Component Value Date   WBC 7.9 12/19/2017   HGB 9.1 (L) 12/19/2017   HCT 29.2 (L) 12/19/2017   MCV 103.2 (H) 12/19/2017   PLT 258 12/19/2017    Lab Results  Component Value Date   CREATININE 0.61 12/19/2017   BUN <5 (L) 12/19/2017   NA 140 12/19/2017   K 3.7 12/19/2017   CL 110 12/19/2017   CO2 25 12/19/2017    Lab Results  Component Value Date   ALT 137 (H) 12/19/2017   AST 97 (H) 12/19/2017   ALKPHOS 81 12/19/2017   BILITOT 0.5 12/19/2017      Microbiology: Recent Results (from the past 240 hour(s))  Body fluid culture     Status: None   Collection Time: 12/09/17  6:28 PM  Result Value Ref Range Status   Specimen Description FLUID RIGHT PLEURAL  Final   Special Requests NONE  Final   Gram Stain   Final    MODERATE WBC PRESENT, PREDOMINANTLY PMN NO ORGANISMS SEEN    Culture   Final    NO GROWTH 3 DAYS Performed at [facility], [address] [place], [place] [address]    Report Status 12/13/2017 FINAL  Final     Kevin Eke, NP [facility] [phone] Pager  12/19/2017  10:29 AM

## 2017-12-19 NOTE — Progress Notes (Signed)
PROGRESS NOTE    Kevin Jimenez  ZOX:[id number] DOB: [birth year] DOA: 12/07/2017 PCP: Fleet Contras, MD   Subjective:  The patient was seen and examined this morning, stable no acute distress, chest tube is still in place.  Heart rate has improved since the initiation of p.o. Cardizem. Denies any chest pain or shortness of breath at this time.  Patient has been encouraged to ambulate and move in bed. Nursing staff report that his p.o. intake has been poor.  ------------------------------------------------------------------------------------------------------------------------------------------------------  Brief Narrative: Patient is a 67 year old male with past medical history of HIV, chronic hepatitis C, noncompliant with Harvoni, hypertension, polysubstance abuse including heroin, tobacco abuse, history of a spontaneous pneumothorax who  is being managed for right-sided pneumonia with parapneumonic effusion and empyema.  He is status post right-sided chest tube placement.  He was admitted on 7/17 when he presented to the emergency department with complaints of weight loss, shortness of breath and cough.  Assessment & Plan:   Principal Problem:   Sepsis due to pneumonia [facility]) Active Problems:   History of substance abuse   HIV (+) Human immunodeficiency virus disease (HCC)   Tobacco abuse   Hyponatremia   Acute kidney injury (HCC)   Protein-calorie malnutrition, severe   Pressure injury of skin   Tachycardia       Paroxysmal atrial for ablation/persistent tachycardia -Overnight per nurse's documentation patient had a persistent tachycardia, did not respond to 10 mg IV Cardizem, IV metoprolol, subsequently with bolus Cardizem was given and patient was initiated on few hours of Cardizem Heart rate has improved, was initiated p.o. Cardizem every 6 hours this morning was switched to long-acting Cardizem of 240 mg p.o. Daily  -According to the history and electronic medical  record patient is not a candidate for chronic anticoagulation, especially now due to comorbidities, noncompliance, empyema   Sepsis/  right-sided pneumonia,/lung abscess/empyema  - Normotensive, -Large right-sided pleural effusion, status post chest tube placement still drainig -Chest tube still draining 140 -Cardiothoracic surgery following managing -Dr. Donata Clay -Still following, chest x-ray reviewed today, continue chest tube toe output is less than 60 cc in 24 hours per CT surgery recommendation  -IV antibiotics Unasyn,  -ID following    HIV/ AIDS -Noncompliant with medication, -Viral load on 727 was undetectable, pending CD4 count - On Biktarvy.    As per CT surgery, drain will be continued until output is less than 60 mL/day.  No recommendation of VATS.  Blood culture, urine culture and peripheral culture are all without growth so far.   Continue Unasyn as per ID  Started on 12/07/17.  Failure to thrive:  -Encourage p.o. intake, dietary supplements -Continue Remeron   Elevated LFTs: -Monitoring, stable  -Hepatitis C versus side effect of Biktarvy.  Continue to monitor.    -Improving avoid hepatotoxic agents  AKI:  Resolved,  -monitoring BUN/creatinine   Hepatitis C: Noncompliant with  Harvoni.  Follow-up with ID as an outpatient.  Insomnia -We will initiate nightly trazodone, monitor closely  Stage II sacral pressure ulcer -Continue wound care, patient has been advised to move in bed, encourage ambulation,    DVT prophylaxis: SCD Code Status: DNR Family Communication: None present at the bedside Disposition Plan: Likely SNF after chest tube removal.SW following   Consultants: CT surgery, ID  Procedures:chest tube placement  Antimicrobials: Unasyn  Objective: Vitals:   12/18/17 1438 12/18/17 1440 12/18/17 2057 12/19/17 0639  BP:  131/88 (!) 139/97 133/89  Pulse:  81 99 80  Resp:  (!) 30 (!)  42 (!) 42  Temp: 98.1 F (36.7 C)  98.1 F (36.7 C)  98.2 F (36.8 C)  TempSrc: Oral  Oral Oral  SpO2:  97% 96% 98%  Weight:      Height:        Intake/Output Summary (Last 24 hours) at 12/19/2017 1304 Last data filed at 12/19/2017 0748 Gross per 24 hour  Intake 2915.09 ml  Output 2130 ml  Net 785.09 ml   Filed Weights   12/16/17 0504 12/17/17 0433 12/18/17 0507  Weight: 66.4 kg (146 lb 6.2 oz) 68.1 kg (150 lb 2.1 oz) 71.4 kg (157 lb 6.5 oz)   BP 133/89 (BP Location: Right Arm)   Pulse 80   Temp 98.2 F (36.8 C) (Oral)   Resp (!) 42   Ht 5\' 11"  (1.803 m)   Wt 71.4 kg (157 lb 6.5 oz)   SpO2 98%   BMI 21.95 kg/m    Physical Exam  Constitution:  Alert, cooperative, no distress, cachectic Psychiatric: Normal and stable mood and affect, cognition intact,   HEENT: Normocephalic, PERRL, otherwise with in Normal limits  Cardio vascular:  S1/S2, RRR, No murmure, No Rubs or Gallops  Chest/pulmonary: Right sided chest wall chest tube in place, draining, poor auscultation on the right, clear on the left, positive breath sounds no labored breathing Abdomen: Soft, non-tender, non-distended, bowel sounds,no masses, no organomegaly Muscular skeletal: Limited exam - in bed, able to move all 4 extremities, Normal strength,  Neuro: CNII-XII intact. , normal motor and sensation, reflexes intact  Extremities: No pitting edema lower extremities, +2 pulses  Skin: Dry, warm to touch, negative for any Rashes, No open wounds       Data Reviewed: I have personally reviewed following labs and imaging studies  CBC: Recent Labs  Lab 12/14/17 0402 12/16/17 0525 12/19/17 0442  WBC 11.2* 8.8 7.9  NEUTROABS  --  6.7  --   HGB 8.8* 10.2* 9.1*  HCT 27.7* 34.2* 29.2*  MCV 101.8* 108.2* 103.2*  PLT 319 288 258   Basic Metabolic Panel: Recent Labs  Lab 12/15/17 0431 12/16/17 0525 12/17/17 0452 12/18/17 0800 12/19/17 0442  NA 140 139 138 139 140  K 3.5 3.7 3.4* 3.2* 3.7  CL 110 109 108 109 110  CO2 27 24 24 22 25   GLUCOSE 106* 106* 109*  116* 126*  BUN 8 6* 5* 6* <5*  CREATININE 0.73 0.68 0.71 0.64 0.61  CALCIUM 7.3* 7.1* 6.8* 6.8* 6.8*  MG  --   --   --  1.0* 1.2*   GFR: Estimated Creatinine Clearance: 90.5 mL/min (by C-G formula based on SCr of 0.61 mg/dL). Liver Function Tests: Recent Labs  Lab 12/15/17 0431 12/16/17 0525 12/17/17 0452 12/18/17 0800 12/19/17 0442  AST 294* 191* 146* 125* 97*  ALT 390* 285* 219* 181* 137*  ALKPHOS 86 77 79 91 81  BILITOT 0.8 1.0 0.8 0.8 0.5  PROT 5.5* 5.7* 5.7* 6.0* 5.6*  ALBUMIN 1.5* 1.5* 1.4* 1.5* 1.3*    Recent Results (from the past 240 hour(s))  Body fluid culture     Status: None   Collection Time: 12/09/17  6:28 PM  Result Value Ref Range Status   Specimen Description FLUID RIGHT PLEURAL  Final   Special Requests NONE  Final   Gram Stain   Final    MODERATE WBC PRESENT, PREDOMINANTLY PMN NO ORGANISMS SEEN    Culture   Final    NO GROWTH 3 DAYS Performed at [facility]  Lab, [address], [place], [place] [address]    Report Status 12/13/2017 FINAL  Final     Radiology Studies: No results found.  Scheduled Meds: . bictegravir-emtricitabine-tenofovir AF  1 tablet Oral Daily  . diltiazem  180 mg Oral Daily  . feeding supplement  1 Container Oral BID BM  . feeding supplement (ENSURE ENLIVE)  237 mL Oral BID BM  . folic acid  1 mg Oral Daily  . Gerhardt's butt cream   Topical BID  . guaiFENesin  600 mg Oral BID  . metoprolol tartrate  50 mg Oral BID  . multivitamin with minerals  1 tablet Oral Daily  . nicotine  14 mg Transdermal Daily  . sodium chloride flush  5 mL Intracatheter Q8H  . thiamine  100 mg Oral Daily  . traZODone  100 mg Oral QHS   Continuous Infusions: . ampicillin-sulbactam (UNASYN) IV 3 g (12/19/17 0531)  . dextrose 5 % and 0.9% NaCl 75 mL/hr at 12/19/17 0850     LOS: 12 days    Time spent: 25 mins.More than 50% of that time was spent in counseling and/or coordination of care.   Kendell Bane, MD [facility] Pager [phone]  If 7PM-7AM, please contact night-coverage [email or web address] Password [email or web address] 12/19/2017, 1:04 PM

## 2017-12-20 ENCOUNTER — Inpatient Hospital Stay (HOSPITAL_COMMUNITY): Payer: Medicare Other

## 2017-12-20 DIAGNOSIS — N179 Acute kidney failure, unspecified: Secondary | ICD-10-CM

## 2017-12-20 DIAGNOSIS — L8990 Pressure ulcer of unspecified site, unspecified stage: Secondary | ICD-10-CM

## 2017-12-20 DIAGNOSIS — J181 Lobar pneumonia, unspecified organism: Secondary | ICD-10-CM

## 2017-12-20 LAB — COMPREHENSIVE METABOLIC PANEL
ALT: 111 U/L — ABNORMAL HIGH (ref 0–44)
ANION GAP: 4 — AB (ref 5–15)
AST: 86 U/L — ABNORMAL HIGH (ref 15–41)
Albumin: 1.3 g/dL — ABNORMAL LOW (ref 3.5–5.0)
Alkaline Phosphatase: 79 U/L (ref 38–126)
BILIRUBIN TOTAL: 0.8 mg/dL (ref 0.3–1.2)
BUN: 6 mg/dL — ABNORMAL LOW (ref 8–23)
CHLORIDE: 111 mmol/L (ref 98–111)
CO2: 24 mmol/L (ref 22–32)
Calcium: 7.1 mg/dL — ABNORMAL LOW (ref 8.9–10.3)
Creatinine, Ser: 0.6 mg/dL — ABNORMAL LOW (ref 0.61–1.24)
Glucose, Bld: 114 mg/dL — ABNORMAL HIGH (ref 70–99)
POTASSIUM: 4.3 mmol/L (ref 3.5–5.1)
Sodium: 139 mmol/L (ref 135–145)
TOTAL PROTEIN: 4.9 g/dL — AB (ref 6.5–8.1)

## 2017-12-20 LAB — CBC
HEMATOCRIT: 32 % — AB (ref 39.0–52.0)
Hemoglobin: 9.9 g/dL — ABNORMAL LOW (ref 13.0–17.0)
MCH: 31.5 pg (ref 26.0–34.0)
MCHC: 30.9 g/dL (ref 30.0–36.0)
MCV: 101.9 fL — AB (ref 78.0–100.0)
Platelets: 295 10*3/uL (ref 150–400)
RBC: 3.14 MIL/uL — AB (ref 4.22–5.81)
RDW: 12.9 % (ref 11.5–15.5)
WBC: 7.5 10*3/uL (ref 4.0–10.5)

## 2017-12-20 NOTE — Progress Notes (Signed)
  Subjective: Patient resting comfortably Pigtail catheter draining clear serous fluid, 100 cc Chest x-ray shows mild bilateral pleural effusions right greater than left, airspace disease right lower lobe  Leave pigtail catheter until drainage less than 60 cc per 24 hours  Objective: Vital signs in last 24 hours: Temp:  [97.6 F (36.4 C)-97.9 F (36.6 C)] 97.6 F (36.4 C) (07/30 0500) Pulse Rate:  [70-79] 78 (07/30 1004) Cardiac Rhythm: Normal sinus rhythm (07/30 0706) Resp:  [30-40] 31 (07/30 0500) BP: (118-145)/(75-89) 127/84 (07/30 1004) SpO2:  [100 %] 100 % (07/30 0500) Weight:  [160 lb 15 oz (73 kg)] 160 lb 15 oz (73 kg) (07/30 0500)  Hemodynamic parameters for last 24 hours:    Intake/Output from previous day: 07/29 0701 - 07/30 0700 In: 2527.1 [P.O.:600; I.V.:1727.1; IV Piggyback:200] Out: 300 [Urine:200; Chest Tube:100] Intake/Output this shift: No intake/output data recorded.  Pigtail catheter drainage marked 1 L on current Pleur-evac container  Lab Results: Recent Labs    12/19/17 0442 12/20/17 0657  WBC 7.9 7.5  HGB 9.1* 9.9*  HCT 29.2* 32.0*  PLT 258 295   BMET:  Recent Labs    12/19/17 0442 12/20/17 0431  NA 140 139  K 3.7 4.3  CL 110 111  CO2 25 24  GLUCOSE 126* 114*  BUN <5* 6*  CREATININE 0.61 0.60*  CALCIUM 6.8* 7.1*    PT/INR: No results for input(s): LABPROT, INR in the last 72 hours. ABG    Component Value Date/Time   PHART 7.405 12/10/2017 0610   HCO3 19.0 (L) 12/10/2017 0610   ACIDBASEDEF 4.9 (H) 12/10/2017 0610   O2SAT 96.2 12/10/2017 0610   CBG (last 3)  No results for input(s): GLUCAP in the last 72 hours.  Assessment/Plan: Right lower lobe pneumonia with loculated pleural effusion Debility and moderate protein deficiency malnutrition Nonischemic cardiomyopathy, EF 20%  patient not Surgical candidate for VATS. Continue IV antibiotics and pigtail catheter drainage Prognosis poor  LOS: 13 days    Kathlee Nations Trigt  III 12/20/2017

## 2017-12-20 NOTE — Progress Notes (Signed)
Patient had 5 beats VT per CMT.  Dr Isidoro Donning notified.  Order obtained for EKG.  EKG obtained and placed on chart.

## 2017-12-20 NOTE — Progress Notes (Signed)
[facility]                                                                              Patient Demographics  Kevin Jimenez, is a 67 y.o. male, DOB - 1951-04-19, SLP:[id number]  Admit date - 12/07/2017   Admitting Physician Briscoe Deutscher, MD  Outpatient Primary MD for the patient is Fleet Contras, MD  Outpatient specialists:   LOS - 13  days   Medical records reviewed and are as summarized below:    Chief Complaint  Patient presents with  . Fatigue       Brief summary   Patient is a 67 year old male with past medical history of HIV, chronic hepatitis C, noncompliant with Harvoni, hypertension, polysubstance abuse including heroin, tobacco abuse, history of a spontaneous pneumothorax who  is being managed for right-sided pneumonia with parapneumonic effusion and empyema.  He is status post right-sided chest tube placement.  He was admitted on 7/17 when he presented to the emergency department with complaints of weight loss, shortness of breath and cough.  Assessment & Plan   Principal problem Right-sided pneumonia with  lung abscess/empyema -Large right-sided pleural effusion, CT VS was consulted, chest tube was placed -Management per CT surgery, chest x-ray showed mild bilateral pleural effusions right greater than left, recommended leave pigtail catheter until drainage less than 60 cc in 24 hours -Infectious disease following, continue IV Unasyn   Active problems Paroxysmal atrial fibrillation with RVR -Currently rate controlled, patient had RVR on 7/27 requiring IV Cardizem and IV metoprolol -Continue oral Cardizem and oral metoprolol -Not a candidate for chronic anticoagulation due to comorbidities empyema and noncompliance  HIV/AIDS -Noncompliant with medications, continue Biktarvy -ID following  Chronic hepatitis C -Outpatient treatment.  Noncompliant with Harvoni  Failure to thrive -Continue Remeron, nutritional supplements,  PT  Transaminitis -Improving, hepatitis C versus side effect of Biktarvy  Acute kidney injury with hyponatremia Likely due to dehydration, resolved, creatinine 1.7 at the time of admission  Stage II sacral pressure ulcer -Frequent turning, encourage ambulation, wound care  Code Status: DNR DVT Prophylaxis:  SCD's Family Communication: Discussed in detail with the patient, all imaging results, lab results explained to the patient    Disposition Plan: Will likely need skilled nursing facility  Time Spent in minutes   35 minutes  Procedures:  Chest tube placement  Consultants:   Infectious disease CT VS  Antimicrobials:   IV Unasyn   Medications  Scheduled Meds: . bictegravir-emtricitabine-tenofovir AF  1 tablet Oral Daily  . diltiazem  180 mg Oral Daily  . feeding supplement  1 Container Oral BID BM  . feeding supplement (ENSURE ENLIVE)  237 mL Oral BID BM  . folic acid  1 mg Oral Daily  . Gerhardt's butt cream   Topical BID  . guaiFENesin  600 mg Oral BID  . metoprolol tartrate  50 mg Oral BID  . multivitamin with minerals  1 tablet Oral Daily  . nicotine  14 mg Transdermal Daily  . sodium chloride flush  5 mL Intracatheter Q8H  . thiamine  100 mg Oral Daily  . traZODone  100 mg Oral QHS  Continuous Infusions: . ampicillin-sulbactam (UNASYN) IV 3 g (12/20/17 0548)  . dextrose 5 % and 0.9% NaCl 75 mL/hr at 12/20/17 0400   PRN Meds:.benzonatate, haloperidol lactate, hydrALAZINE, metoprolol tartrate, ondansetron **OR** ondansetron (ZOFRAN) IV   Antibiotics   Anti-infectives (From admission, onward)   Start     Dose/Rate Route Frequency Ordered Stop   12/08/17 1430  Ampicillin-Sulbactam (UNASYN) 3 g in sodium chloride 0.9 % 100 mL IVPB     3 g 200 mL/hr over 30 Minutes Intravenous Every 8 hours 12/08/17 0915     12/08/17 0630  azithromycin (ZITHROMAX) 500 mg in sodium chloride 0.9 % 250 mL IVPB  Status:  Discontinued     500 mg 250 mL/hr over 60 Minutes  Intravenous Every 24 hours 12/07/17 1014 12/07/17 1442   12/08/17 0600  cefTRIAXone (ROCEPHIN) 2 g in sodium chloride 0.9 % 100 mL IVPB  Status:  Discontinued     2 g 200 mL/hr over 30 Minutes Intravenous Every 24 hours 12/07/17 1014 12/07/17 1442   12/08/17 0300  vancomycin (VANCOCIN) 500 mg in sodium chloride 0.9 % 100 mL IVPB  Status:  Discontinued     500 mg 100 mL/hr over 60 Minutes Intravenous Every 12 hours 12/07/17 1455 12/09/17 1423   12/07/17 1800  bictegravir-emtricitabine-tenofovir AF (BIKTARVY) 50-200-25 MG per tablet 1 tablet     1 tablet Oral Daily 12/07/17 1651     12/07/17 1500  vancomycin (VANCOCIN) IVPB 1000 mg/200 mL premix     1,000 mg 200 mL/hr over 60 Minutes Intravenous  Once 12/07/17 1455 12/07/17 1638   12/07/17 1500  piperacillin-tazobactam (ZOSYN) IVPB 3.375 g  Status:  Discontinued     3.375 g 12.5 mL/hr over 240 Minutes Intravenous Every 8 hours 12/07/17 1455 12/08/17 0915   12/07/17 0245  cefTRIAXone (ROCEPHIN) 2 g in sodium chloride 0.9 % 100 mL IVPB  Status:  Discontinued     2 g 200 mL/hr over 30 Minutes Intravenous Every 24 hours 12/07/17 0236 12/07/17 1007   12/07/17 0245  azithromycin (ZITHROMAX) 500 mg in sodium chloride 0.9 % 250 mL IVPB  Status:  Discontinued     500 mg 250 mL/hr over 60 Minutes Intravenous Every 24 hours 12/07/17 0236 12/07/17 1007        Subjective:   Kevin Jimenez was seen and examined today.  Mild shortness of breath, no chest pain, no fevers or chills. Patient denies dizziness, abdominal pain, N/V/D/C, new weakness, numbess, tingling. No acute events overnight.    Objective:   Vitals:   12/19/17 1628 12/19/17 2014 12/20/17 0500 12/20/17 1004  BP: 128/75 (!) 145/89 118/78 127/84  Pulse: 74 79 70 78  Resp: (!) 30 (!) 40 (!) 31   Temp: 97.9 F (36.6 C) 97.6 F (36.4 C) 97.6 F (36.4 C)   TempSrc: Oral Oral Oral   SpO2: 100% 100% 100%   Weight:   73 kg (160 lb 15 oz)   Height:        Intake/Output Summary (Last  24 hours) at 12/20/2017 1335 Last data filed at 12/20/2017 0551 Gross per 24 hour  Intake 2287.12 ml  Output 260 ml  Net 2027.12 ml     Wt Readings from Last 3 Encounters:  12/20/17 73 kg (160 lb 15 oz)  04/04/17 52.2 kg (115 lb)  03/30/17 53.1 kg (117 lb)     Exam  General: Alert and oriented x 3, NAD  Eyes:   HEENT:   Cardiovascular: S1 S2 auscultated, Regular rate  and rhythm.  Respiratory: Decreased breath sound at the bases  Gastrointestinal: Soft, nontender, nondistended, + bowel sounds  Ext: no pedal edema bilaterally  Neuro: moving all 4 extremities  Musculoskeletal: No digital cyanosis, clubbing  Skin: No rashes  Psych: flat affect, alert and oriented x3    Data Reviewed:  I have personally reviewed following labs and imaging studies  Micro Results No results found for this or any previous visit (from the past 240 hour(s)).  Radiology Reports Dg Chest 2 View  Result Date: 12/20/2017 CLINICAL DATA:  Chest tube, shortness of breath EXAM: CHEST - 2 VIEW COMPARISON:  12/16/2017 FINDINGS: Right basilar chest tube remains in place. Continued loculated right basilar hydropneumothorax. Moderate bilateral pleural effusions. Cardiomegaly. Bilateral airspace disease again noted, right greater than left. IMPRESSION: No real change since prior study. Continued loculated pneumothorax at the right lung base with moderate bilateral pleural effusions and bilateral airspace disease, right greater than left. Electronically Signed   By: Charlett Nose M.D.   On: 12/20/2017 11:18   Dg Chest 2 View  Result Date: 12/07/2017 CLINICAL DATA:  67 year old male with shortness of breath and cough. EXAM: CHEST - 2 VIEW COMPARISON:  Chest radiograph dated 04/04/2017 FINDINGS: Large area of consolidative change at the right lung base abutting the lateral pleural surface. Although this may represent pneumonia a mass is not excluded. Clinical correlation and follow-up to resolution  recommended. There is a small right pleural effusion. The left lung is clear. No pneumothorax. The cardiac silhouette is within normal limits. No acute osseous pathology. IMPRESSION: Right lower lobe consolidation may represent pneumonia. Clinical correlation and follow-up to resolution recommended to exclude an underlying mass. Trace right pleural effusion. Electronically Signed   By: Elgie Collard M.D.   On: 12/07/2017 02:25   Ct Head Wo Contrast  Result Date: 12/07/2017 CLINICAL DATA:  67 year old male with shortness of breath and altered mental status. EXAM: CT HEAD WITHOUT CONTRAST TECHNIQUE: Contiguous axial images were obtained from the base of the skull through the vertex without intravenous contrast. COMPARISON:  None. FINDINGS: Brain: The ventricles and sulci appropriate size for patient's age. Mild periventricular and deep white matter chronic microvascular ischemic changes noted. 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 There is no acute intracranial hemorrhage. No mass effect or midline shift. No extra-axial fluid collection. Vascular: No hyperdense vessel or unexpected calcification. Skull: Normal. Negative for fracture or focal lesion. Sinuses/Orbits: Mild diffuse mucoperiosteal thickening of paranasal sinuses. No air-fluid levels. The mastoid air cells are clear. Other: None IMPRESSION: 1. No acute intracranial pathology. 2. Mild chronic microvascular ischemic changes. Electronically Signed   By: Elgie Collard M.D.   On: 12/07/2017 02:23   Ct Chest W Contrast  Result Date: 12/07/2017 CLINICAL DATA:  Cough and shortness of breath, HIV. Unintentional weight loss. EXAM: CT CHEST WITH CONTRAST TECHNIQUE: Multidetector CT imaging of the chest was performed during intravenous contrast administration. CONTRAST:  OMNIPAQUE IOHEXOL 300 MG/ML  SOLN COMPARISON:  Chest radiographs 12/07/2017 and CT chest 03/30/2017. FINDINGS: Cardiovascular:  Atherosclerotic calcification of the arterial vasculature, including coronary arteries. Heart is at the upper limits normal in size to mildly enlarged. No pericardial effusion. Mediastinum/Nodes: Mediastinal lymph nodes measure up to 9 mm in the low right paratracheal station. Hilar lymph nodes measure up to 1.4 cm on the right. No axillary adenopathy. Esophagus is grossly unremarkable. Lungs/Pleura: Right lower lobe consolidation with areas of decreased attenuation, air and air-fluid levels. Highly loculated  large right pleural effusion. Centrilobular and paraseptal emphysema. Subpleural ground-glass in the posterior left upper lobe, likely infectious or inflammatory in etiology. Small left pleural effusion, simple in appearance, with minimal compressive atelectasis in the left lower lobe. Upper Abdomen: Visualized portion of the liver is mildly heterogeneous, which may be due to arterial phase imaging. There are 2 areas of hyper attenuation within the right hepatic lobe, measuring up to 1.6 cm visualized portions of the adrenal glands, kidneys, spleen, pancreas, stomach and bowel are grossly unremarkable. Upper abdominal lymph nodes are not enlarged by CT size criteria. Musculoskeletal: No worrisome lytic or sclerotic lesions. IMPRESSION: 1. Right lower lobe consolidation is likely due to pneumonia. Internal fluid, air and air-fluid levels indicate associated necrosis/abscess formation. An underlying mass cannot be definitively excluded. Follow-up to clearing is recommended. 2. Large loculated right pleural effusion is indicative of an empyema. 3. Right hilar adenopathy, likely reactive. This can also be re-evaluated on follow-up imaging. 4. Small left pleural effusion. 5. Hyperattenuating lesions in the right hepatic lobe are difficult to further characterize but may represent perfusion anomalies or flash fill hemangiomas. If further evaluation is desired, MR abdomen without and with contrast is recommended. 6.  Aortic atherosclerosis (ICD10-170.0). Coronary artery calcification. 7.  Emphysema (ICD10-J43.9). Electronically Signed   By: Leanna Battles M.D.   On: 12/07/2017 12:57   Dg Chest Port 1 View  Result Date: 12/16/2017 CLINICAL DATA:  Follow-up chest tube EXAM: PORTABLE CHEST 1 VIEW COMPARISON:  12/14/2017 FINDINGS: Cardiac shadow is stable. Right-sided chest tube is again seen. Fluid is noted loculated within the minor fissure as well as inferiorly on the right. A small left pleural effusion is again seen and stable. No new focal infiltrate is noted. No bony abnormality is seen. IMPRESSION: No significant interval change from the prior exam. Electronically Signed   By: Alcide Clever M.D.   On: 12/16/2017 07:22   Dg Chest Port 1 View  Result Date: 12/14/2017 CLINICAL DATA:  Dyspnea. EXAM: PORTABLE CHEST 1 VIEW COMPARISON:  12/12/2017. FINDINGS: Stable cardiomediastinal silhouette. Pigtail catheter RIGHT lower chest is unchanged, along with asymmetric RIGHT lung opacity. No pneumothorax. IMPRESSION: Stable chest. Electronically Signed   By: Elsie Stain M.D.   On: 12/14/2017 07:17   Dg Chest Port 1 View  Result Date: 12/12/2017 CLINICAL DATA:  Dyspnea, pleural effusions, right-sided chest tube. EXAM: PORTABLE CHEST 1 VIEW COMPARISON:  Portable chest x-ray of December 11, 2017 FINDINGS: Confluent airspace opacity persists in the right mid and lower lung. There is a moderate amount of pleural fluid on the right which is stable. The right pigtail pleural drainage catheter is in stable position inferiorly. On the left the lung is better inflated. There is left basilar atelectasis. There is a small left pleural effusion. The heart is top-normal in size. The pulmonary vascularity is normal. There is calcification in the wall of the aortic arch. The bony thorax is unremarkable. IMPRESSION: Persistent right basilar atelectasis and/or pneumonia with moderate-sized right pleural effusion. Stable positioning of the  pigtail catheter on the right. No pneumothorax. Stable small left pleural effusion and basilar atelectasis. Thoracic aortic atherosclerosis. Electronically Signed   By: David  [place] M.D.   On: 12/12/2017 07:49   Dg Chest Port 1 View  Result Date: 12/11/2017 CLINICAL DATA:  Shortness of breath. EXAM: PORTABLE CHEST 1 VIEW COMPARISON:  12/10/2017 FINDINGS: Pigtail drainage catheter over right lung base unchanged. No evidence pneumothorax. Lungs are adequately inflated with persistent opacification over the right mid to lower  lung unchanged likely right-sided effusion with atelectasis. Possible very small amount of left pleural fluid unchanged. Cardiomediastinal silhouette and remainder the exam is unchanged. IMPRESSION: Stable opacification over the right mid to lower lung likely effusion with atelectasis. Pigtail drainage catheter over the right base unchanged. No pneumothorax. Tiny stable amount left pleural fluid. Electronically Signed   By: Elberta Fortis M.D.   On: 12/11/2017 10:51   Dg Chest Port 1 View  Result Date: 12/10/2017 CLINICAL DATA:  Empyema.  Status post drainage catheter placement. EXAM: PORTABLE CHEST 1 VIEW COMPARISON:  CT scan 12/07/2017 and chest x-ray, same date. FINDINGS: The cardiac silhouette, mediastinal and hilar contours are within normal limits and stable. The left lung remains clear. There is a pigtail catheter noted at the right lung base which was placed into the empyema cavity. The loculated right basilar fluid collection is smaller. There is surrounding pneumonia and atelectasis which also appears slightly improved. IMPRESSION: Right basilar pleural drainage catheter in good position with decreased loculated right pleural fluid collection/empyema. Persistent right lower lobe pneumonia and atelectasis but it may be slightly improved. The left lung remains clear. Electronically Signed   By: Rudie Meyer M.D.   On: 12/10/2017 08:40   Ct Image Guided Drainage By Percutaneous  Catheter  Result Date: 12/10/2017 INDICATION: Sepsis, pneumonia, HIV, right parapneumonic effusion versus empyema EXAM: CT-GUIDED 14 FRENCH RIGHT CHEST TUBE INSERTION MEDICATIONS: The patient is currently admitted to the hospital and receiving intravenous antibiotics. The antibiotics were administered within an appropriate time frame prior to the initiation of the procedure. ANESTHESIA/SEDATION: 2.0 mg IV Versed 100 mcg IV Fentanyl Moderate Sedation Time:  20 MINUTES The patient was continuously monitored during the procedure by the interventional radiology nurse under my direct supervision. COMPLICATIONS: None immediate. TECHNIQUE: Informed written consent was obtained from the patient after a thorough discussion of the procedural risks, benefits and alternatives. All questions were addressed. Maximal Sterile Barrier Technique was utilized including caps, mask, sterile gowns, sterile gloves, sterile drape, hand hygiene and skin antiseptic. A timeout was performed prior to the initiation of the procedure. PROCEDURE: The right lateral chest was prepped with ChloraPrep in a sterile fashion, and a sterile drape was applied covering the operative field. A sterile gown and sterile gloves were used for the procedure. Local anesthesia was provided with 1% Lidocaine. Previous imaging reviewed. Patient positioned supine. Noncontrast localization CT performed. The large loculated right pleural effusion was localized. A right lateral approach mid axillary line was marked. Under sterile conditions and local anesthesia, an 18 gauge 10 cm access needle was advanced percutaneously right lateral axillary approach into the pleural effusion. There was return of exudative fluid. Amplatz guidewire inserted followed by tract dilatation to insert a 14 French drain. Drain catheter position confirmed with CT. Chest tube connected to pleura vac. 400 cc removed. Sample sent for culture. Catheter secured with a Prolene suture and a sterile  dressing. No immediate complication. Patient tolerated the procedure well. FINDINGS: Imaging confirms needle placed in the right loculated effusion for drain insertion IMPRESSION: Successful CT-guided 14 French right chest tube insertion Electronically Signed   By: Judie Petit.  Shick M.D.   On: 12/10/2017 08:17    Lab Data:  CBC: Recent Labs  Lab 12/14/17 0402 12/16/17 0525 12/19/17 0442 12/20/17 0657  WBC 11.2* 8.8 7.9 7.5  NEUTROABS  --  6.7  --   --   HGB 8.8* 10.2* 9.1* 9.9*  HCT 27.7* 34.2* 29.2* 32.0*  MCV 101.8* 108.2* 103.2* 101.9*  PLT  319 288 258 295   Basic Metabolic Panel: Recent Labs  Lab 12/16/17 0525 12/17/17 0452 12/18/17 0800 12/19/17 0442 12/20/17 0431  NA 139 138 139 140 139  K 3.7 3.4* 3.2* 3.7 4.3  CL 109 108 109 110 111  CO2 24 24 22 25 24   GLUCOSE 106* 109* 116* 126* 114*  BUN 6* 5* 6* <5* 6*  CREATININE 0.68 0.71 0.64 0.61 0.60*  CALCIUM 7.1* 6.8* 6.8* 6.8* 7.1*  MG  --   --  1.0* 1.2*  --    GFR: Estimated Creatinine Clearance: 92.5 mL/min (A) (by C-G formula based on SCr of 0.6 mg/dL (L)). Liver Function Tests: Recent Labs  Lab 12/16/17 0525 12/17/17 0452 12/18/17 0800 12/19/17 0442 12/20/17 0431  AST 191* 146* 125* 97* 86*  ALT 285* 219* 181* 137* 111*  ALKPHOS 77 79 91 81 79  BILITOT 1.0 0.8 0.8 0.5 0.8  PROT 5.7* 5.7* 6.0* 5.6* 4.9*  ALBUMIN 1.5* 1.4* 1.5* 1.3* 1.3*   No results for input(s): LIPASE, AMYLASE in the last 168 hours. No results for input(s): AMMONIA in the last 168 hours. Coagulation Profile: No results for input(s): INR, PROTIME in the last 168 hours. Cardiac Enzymes: Recent Labs  Lab 12/18/17 0800 12/18/17 1504 12/18/17 2052  TROPONINI 0.05* 0.06* 0.05*   BNP (last 3 results) No results for input(s): PROBNP in the last 8760 hours. HbA1C: No results for input(s): HGBA1C in the last 72 hours. CBG: No results for input(s): GLUCAP in the last 168 hours. Lipid Profile: No results for input(s): CHOL, HDL, LDLCALC,  TRIG, CHOLHDL, LDLDIRECT in the last 72 hours. Thyroid Function Tests: Recent Labs    12/18/17 0801  TSH 3.004   Anemia Panel: No results for input(s): VITAMINB12, FOLATE, FERRITIN, TIBC, IRON, RETICCTPCT in the last 72 hours. Urine analysis:    Component Value Date/Time   COLORURINE YELLOW 12/07/2017 0300   APPEARANCEUR HAZY (A) 12/07/2017 0300   LABSPEC 1.010 12/07/2017 0300   PHURINE 6.0 12/07/2017 0300   GLUCOSEU NEGATIVE 12/07/2017 0300   HGBUR SMALL (A) 12/07/2017 0300   BILIRUBINUR NEGATIVE 12/07/2017 0300   KETONESUR NEGATIVE 12/07/2017 0300   PROTEINUR NEGATIVE 12/07/2017 0300   NITRITE NEGATIVE 12/07/2017 0300   LEUKOCYTESUR NEGATIVE 12/07/2017 0300     Aerionna Moravek M.D. [facility] 12/20/2017, 1:35 PM  Pager: [phone] Between 7am to 7pm - call Pager - [phone]  After 7pm go to [email or web address] - password [email or web address]  Call night coverage person covering after 7pm

## 2017-12-20 NOTE — Progress Notes (Signed)
    [facility]   Reason for visit: Follow up on empyema  Interval History: continues to have significant drainage, about 100 cc yesterday. No new complaints.  No associated rash or diarrhea.    Physical Exam: Constitutional:  Vitals:   12/20/17 0500 12/20/17 1004  BP: 118/78 127/84  Pulse: 70 78  Resp: (!) 31   Temp: 97.6 F (36.4 C)   SpO2: 100%    patient appears in NAD Eyes: anicteric HENT: no thrush Respiratory: stable respiratory effort, though remains increased, somewhat shallow; CTA B Cardiovascular: RRR GI: soft, nt, nd  Review of Systems: Constitutional: negative for fevers, chills and malaise Gastrointestinal: negative for nausea and diarrhea Integument/breast: negative for rash  Lab Results  Component Value Date   WBC 7.5 12/20/2017   HGB 9.9 (L) 12/20/2017   HCT 32.0 (L) 12/20/2017   MCV 101.9 (H) 12/20/2017   PLT 295 12/20/2017    Lab Results  Component Value Date   CREATININE 0.60 (L) 12/20/2017   BUN 6 (L) 12/20/2017   NA 139 12/20/2017   K 4.3 12/20/2017   CL 111 12/20/2017   CO2 24 12/20/2017    Lab Results  Component Value Date   ALT 111 (H) 12/20/2017   AST 86 (H) 12/20/2017   ALKPHOS 79 12/20/2017     Microbiology: No results found for this or any previous visit (from the past 240 hour(s)).  Impression/Plan:  1. Empyema - continue with unasyn and will determine stop date once his chest tube is out.  2.  HIV - CD4 now back up to 330 and viral load now suppressed. Continue Biktarvy  3.  Pressure ulcer - developing while here.  Needs to get up.

## 2017-12-20 NOTE — Progress Notes (Signed)
OT Cancellation Note  Patient Details Name: Donta Fuster MRN: [id number] DOB: Aug 28, 1950   Cancelled Treatment:    Reason Eval/Treat Not Completed: Patient declined, no reason specified.  Patient reporting "exhaustion" and refusing to participate.  Educated on importance of mobilization, offering supine or EOB therex but patient continues to refuse stating "Come back tomorrow". Will attempt again as time allows.   Chancy Milroy 12/20/2017, 3:25 PM

## 2017-12-20 NOTE — Progress Notes (Addendum)
RN notified by NT that patient's penis and scrotum are swollen.  Patient states it is painful.  NT (dymond) states that area is double in size from when bath was performed this AM.  Dr Isidoro Donning paged.  Awaiting call back.  UPDATE:  Verbal order from Dr Isidoro Donning for US scrotum and to prop scrotum on pillow or towel.

## 2017-12-20 NOTE — Progress Notes (Signed)
PT Cancellation Note  Patient Details Name: Kevin Jimenez MRN: [id number] DOB: 05-16-1951   Cancelled Treatment:    Reason Eval/Treat Not Completed: Patient declined, no reason specified Pt attempted to educate pt on importance of mobility, including offering edge of bed and in bed therex, pt continued to refuse stating "I'm not doing anything".  PT will resume treatment as appropriate  Khia Dieterich 12/20/2017, 12:18 PM

## 2017-12-21 DIAGNOSIS — E860 Dehydration: Secondary | ICD-10-CM

## 2017-12-21 LAB — COMPREHENSIVE METABOLIC PANEL
ALK PHOS: 82 U/L (ref 38–126)
ALT: 101 U/L — ABNORMAL HIGH (ref 0–44)
AST: 76 U/L — ABNORMAL HIGH (ref 15–41)
Albumin: 1.4 g/dL — ABNORMAL LOW (ref 3.5–5.0)
Anion gap: 5 (ref 5–15)
BILIRUBIN TOTAL: 0.7 mg/dL (ref 0.3–1.2)
BUN: 6 mg/dL — ABNORMAL LOW (ref 8–23)
CALCIUM: 7.5 mg/dL — AB (ref 8.9–10.3)
CO2: 27 mmol/L (ref 22–32)
CREATININE: 0.6 mg/dL — AB (ref 0.61–1.24)
Chloride: 111 mmol/L (ref 98–111)
Glucose, Bld: 117 mg/dL — ABNORMAL HIGH (ref 70–99)
Potassium: 3.8 mmol/L (ref 3.5–5.1)
Sodium: 143 mmol/L (ref 135–145)
TOTAL PROTEIN: 5.7 g/dL — AB (ref 6.5–8.1)

## 2017-12-21 LAB — CBC
HEMATOCRIT: 31.4 % — AB (ref 39.0–52.0)
HEMOGLOBIN: 9.8 g/dL — AB (ref 13.0–17.0)
MCH: 31.6 pg (ref 26.0–34.0)
MCHC: 31.2 g/dL (ref 30.0–36.0)
MCV: 101.3 fL — AB (ref 78.0–100.0)
Platelets: 272 10*3/uL (ref 150–400)
RBC: 3.1 MIL/uL — AB (ref 4.22–5.81)
RDW: 13.1 % (ref 11.5–15.5)
WBC: 6.3 10*3/uL (ref 4.0–10.5)

## 2017-12-21 LAB — BRAIN NATRIURETIC PEPTIDE: B Natriuretic Peptide: 1238.9 pg/mL — ABNORMAL HIGH (ref 0.0–100.0)

## 2017-12-21 MED ORDER — DILTIAZEM HCL ER COATED BEADS 120 MG PO CP24
120.0000 mg | ORAL_CAPSULE | Freq: Every day | ORAL | Status: DC
  Administered 2017-12-21 – 2017-12-27 (×7): 120 mg via ORAL
  Filled 2017-12-21 (×7): qty 1

## 2017-12-21 MED ORDER — METOPROLOL TARTRATE 25 MG PO TABS
25.0000 mg | ORAL_TABLET | Freq: Two times a day (BID) | ORAL | Status: DC
  Administered 2017-12-21 – 2017-12-27 (×14): 25 mg via ORAL
  Filled 2017-12-21 (×14): qty 1

## 2017-12-21 MED ORDER — FUROSEMIDE 10 MG/ML IJ SOLN
40.0000 mg | Freq: Three times a day (TID) | INTRAMUSCULAR | Status: DC
  Administered 2017-12-21 – 2017-12-22 (×4): 40 mg via INTRAVENOUS
  Filled 2017-12-21 (×4): qty 4

## 2017-12-21 NOTE — Progress Notes (Signed)
  Subjective: R pigtail output now scant- ok to remove tube  Objective: Vital signs in last 24 hours: Temp:  [97.7 F (36.5 C)] 97.7 F (36.5 C) (07/31 0530) Pulse Rate:  [69-78] 69 (07/31 0530) Cardiac Rhythm: Normal sinus rhythm (07/31 0706) Resp:  [30] 30 (07/30 2029) BP: (127-141)/(76-98) 131/76 (07/31 0530) SpO2:  [98 %] 98 % (07/31 0530) Weight:  [118 lb 9.7 oz (53.8 kg)] 118 lb 9.7 oz (53.8 kg) (07/31 0530)  Hemodynamic parameters for last 24 hours:    Intake/Output from previous day: 07/30 0701 - 07/31 0700 In: 4279.7 [P.O.:600; I.V.:1798.9; IV Piggyback:1880.8] Out: 192 [Urine:112; Chest Tube:80] Intake/Output this shift: Total I/O In: 189.8 [I.V.:109; IV Piggyback:80.8] Out: 100 [Urine:100]    Lab Results: Recent Labs    12/20/17 0657 12/21/17 0714  WBC 7.5 6.3  HGB 9.9* 9.8*  HCT 32.0* 31.4*  PLT 295 272   BMET:  Recent Labs    12/20/17 0431 12/21/17 0714  NA 139 143  K 4.3 3.8  CL 111 111  CO2 24 27  GLUCOSE 114* 117*  BUN 6* 6*  CREATININE 0.60* 0.60*  CALCIUM 7.1* 7.5*    PT/INR: No results for input(s): LABPROT, INR in the last 72 hours. ABG    Component Value Date/Time   PHART 7.405 12/10/2017 0610   HCO3 19.0 (L) 12/10/2017 0610   ACIDBASEDEF 4.9 (H) 12/10/2017 0610   O2SAT 96.2 12/10/2017 0610   CBG (last 3)  No results for input(s): GLUCAP in the last 72 hours.  Assessment/Plan: S/P drainage R effusion- DC pigtail Cont lasix for cardiomyopathy, bilateral  pleural effusions    LOS: 14 days    Kathlee Nations Trigt III 12/21/2017

## 2017-12-21 NOTE — Progress Notes (Signed)
CSW met with patient, sister [name], and patient's mother at bedside. CSW had provided patient with SNF bed offers on 7/29. Patient stated today that he has not reviewed them and made a choice yet. Patient's family to review with him this afternoon and follow up for choice. Patient will need [facility] authorization and likely carve out with insurance for HIV medication at SNF, due to the high cost. CSW to follow and support.  Estanislado Emms, [place]

## 2017-12-21 NOTE — Plan of Care (Signed)
  Problem: Education: Goal: Knowledge of General Education information will improve Description Including pain rating scale, medication(s)/side effects and non-pharmacologic comfort measures Outcome: Completed/Met

## 2017-12-21 NOTE — Progress Notes (Signed)
[facility]                                                                              Patient Demographics  Kevin Jimenez, is a 67 y.o. male, DOB - 09-14-1950, WUJ:[id number]  Admit date - 12/07/2017   Admitting Physician Briscoe Deutscher, MD  Outpatient Primary MD for the patient is Fleet Contras, MD  Outpatient specialists:   LOS - 14  days   Medical records reviewed and are as summarized below:    Chief Complaint  Patient presents with  . Fatigue       Brief summary   Patient is a 67 year old male with past medical history of HIV, chronic hepatitis C, noncompliant with Harvoni, hypertension, polysubstance abuse including heroin, tobacco abuse, history of a spontaneous pneumothorax who  is being managed for right-sided pneumonia with parapneumonic effusion and empyema.  He is status post right-sided chest tube placement.  He was admitted on 7/17 when he presented to the emergency department with complaints of weight loss, shortness of breath and cough.  Assessment & Plan   Principal problem Right-sided pneumonia with  lung abscess/empyema -Large right-sided pleural effusion, CT VS was consulted, chest tube was placed -Management per CT surgery, plan for chest tube removal today -Infectious disease following, continue IV Unasyn  Active problems Dyspnea secondary to acute on chronic systolic CHF exacerbation, bilateral pleural effusion likely due to volume overload - At the time of my examination, patient has shallow breathing, bibasilar crackles, peripheral edema with penile and scrotal edema -I's and O's with 27.5 L positive, at the time of admission weight 121lbs-> 160 lbs on 7/30 - BNP 1238, scrotal ultrasound did not show any acute issues -Reviewed 2D echo done on 7/20 which had shown EF of 30 to 35% with severe hypokinesis at the basal to mid ventricular level -Discontinued IV fluids, saline lock, started on Lasix 40 mg IV every 8  hours  Paroxysmal atrial fibrillation with RVR -Currently rate controlled, patient had RVR on 7/27 requiring IV Cardizem and IV metoprolol -Decreased oral Cardizem and metoprolol to allow diuresis -Not a candidate for chronic anticoagulation due to comorbidities empyema and noncompliance  HIV/AIDS -Noncompliant with medications, continue Biktarvy -ID following  Chronic hepatitis C -Outpatient treatment.  Noncompliant with Harvoni  Failure to thrive -Continue Remeron, nutritional supplements, PT  Transaminitis -Improving, hepatitis C versus side effect of Biktarvy  Acute kidney injury with hyponatremia -Resolved, creatinine currently 0.6, monitor closely with aggressive diuresis  Stage II sacral pressure ulcer -Frequent turning, encourage ambulation, wound care  Code Status: DNR DVT Prophylaxis:  SCD's Family Communication: Discussed in detail with the patient, all imaging results, lab results explained to the patient    Disposition Plan: Will likely need skilled nursing facility  Time Spent in minutes   35 minutes  Procedures:  Chest tube placement  Consultants:   Infectious disease CT VS  Antimicrobials:   IV Unasyn   Medications  Scheduled Meds: . bictegravir-emtricitabine-tenofovir AF  1 tablet Oral Daily  . diltiazem  120 mg Oral Daily  . feeding supplement  1 Container Oral BID BM  . feeding supplement (ENSURE ENLIVE)  237 mL Oral BID BM  . folic acid  1 mg Oral Daily  . furosemide  40 mg Intravenous Q8H  . Gerhardt's butt cream   Topical BID  . guaiFENesin  600 mg Oral BID  . metoprolol tartrate  25 mg Oral BID  . multivitamin with minerals  1 tablet Oral Daily  . nicotine  14 mg Transdermal Daily  . sodium chloride flush  5 mL Intracatheter Q8H  . thiamine  100 mg Oral Daily  . traZODone  100 mg Oral QHS   Continuous Infusions: . ampicillin-sulbactam (UNASYN) IV Stopped (12/21/17 0616)   PRN Meds:.benzonatate, haloperidol lactate,  hydrALAZINE, metoprolol tartrate, ondansetron **OR** ondansetron (ZOFRAN) IV   Antibiotics   Anti-infectives (From admission, onward)   Start     Dose/Rate Route Frequency Ordered Stop   12/08/17 1430  Ampicillin-Sulbactam (UNASYN) 3 g in sodium chloride 0.9 % 100 mL IVPB     3 g 200 mL/hr over 30 Minutes Intravenous Every 8 hours 12/08/17 0915     12/08/17 0630  azithromycin (ZITHROMAX) 500 mg in sodium chloride 0.9 % 250 mL IVPB  Status:  Discontinued     500 mg 250 mL/hr over 60 Minutes Intravenous Every 24 hours 12/07/17 1014 12/07/17 1442   12/08/17 0600  cefTRIAXone (ROCEPHIN) 2 g in sodium chloride 0.9 % 100 mL IVPB  Status:  Discontinued     2 g 200 mL/hr over 30 Minutes Intravenous Every 24 hours 12/07/17 1014 12/07/17 1442   12/08/17 0300  vancomycin (VANCOCIN) 500 mg in sodium chloride 0.9 % 100 mL IVPB  Status:  Discontinued     500 mg 100 mL/hr over 60 Minutes Intravenous Every 12 hours 12/07/17 1455 12/09/17 1423   12/07/17 1800  bictegravir-emtricitabine-tenofovir AF (BIKTARVY) 50-200-25 MG per tablet 1 tablet     1 tablet Oral Daily 12/07/17 1651     12/07/17 1500  vancomycin (VANCOCIN) IVPB 1000 mg/200 mL premix     1,000 mg 200 mL/hr over 60 Minutes Intravenous  Once 12/07/17 1455 12/07/17 1638   12/07/17 1500  piperacillin-tazobactam (ZOSYN) IVPB 3.375 g  Status:  Discontinued     3.375 g 12.5 mL/hr over 240 Minutes Intravenous Every 8 hours 12/07/17 1455 12/08/17 0915   12/07/17 0245  cefTRIAXone (ROCEPHIN) 2 g in sodium chloride 0.9 % 100 mL IVPB  Status:  Discontinued     2 g 200 mL/hr over 30 Minutes Intravenous Every 24 hours 12/07/17 0236 12/07/17 1007   12/07/17 0245  azithromycin (ZITHROMAX) 500 mg in sodium chloride 0.9 % 250 mL IVPB  Status:  Discontinued     500 mg 250 mL/hr over 60 Minutes Intravenous Every 24 hours 12/07/17 0236 12/07/17 1007        Subjective:   Kevin Jimenez was seen and examined today.  Feels short of breath, no chest pain,  no fevers or chills.  Scrotal and penile edema+.   Patient denies dizziness, abdominal pain, N/V/D/C, new weakness, numbess, tingling. No acute events overnight.    Objective:   Vitals:   12/20/17 0500 12/20/17 1004 12/20/17 2029 12/21/17 0530  BP: 118/78 127/84 (!) 141/98 131/76  Pulse: 70 78 72 69  Resp: (!) 31  (!) 30   Temp: 97.6 F (36.4 C)  97.7 F (36.5 C) 97.7 F (36.5 C)  TempSrc: Oral  Axillary Axillary  SpO2: 100%  98% 98%  Weight: 73 kg (160 lb 15 oz)   53.8 kg (118 lb 9.7 oz)  Height:  Intake/Output Summary (Last 24 hours) at 12/21/2017 1302 Last data filed at 12/21/2017 1149 Gross per 24 hour  Intake 4109.5 ml  Output 580 ml  Net 3529.5 ml     Wt Readings from Last 3 Encounters:  12/21/17 53.8 kg (118 lb 9.7 oz)  04/04/17 52.2 kg (115 lb)  03/30/17 53.1 kg (117 lb)     Exam    General: Alert and oriented x 3, NAD  Eyes:   HEENT: JVP+  Cardiovascular: S1 S2 auscultated,  Regular rate and rhythm 1+ pedal edema b/l  Respiratory: Bibasilar crackles to midlung  Gastrointestinal: Soft, nontender, nondistended, + bowel sounds  Ext: 1+ pedal edema bilaterally, penile and scrotal edema  Neuro: no new deficits  Musculoskeletal: No digital cyanosis, clubbing  Skin: No rashes  Psych: Normal affect and demeanor, alert and oriented x3     Data Reviewed:  I have personally reviewed following labs and imaging studies  Micro Results No results found for this or any previous visit (from the past 240 hour(s)).  Radiology Reports Dg Chest 2 View  Result Date: 12/20/2017 CLINICAL DATA:  Chest tube, shortness of breath EXAM: CHEST - 2 VIEW COMPARISON:  12/16/2017 FINDINGS: Right basilar chest tube remains in place. Continued loculated right basilar hydropneumothorax. Moderate bilateral pleural effusions. Cardiomegaly. Bilateral airspace disease again noted, right greater than left. IMPRESSION: No real change since prior study. Continued loculated  pneumothorax at the right lung base with moderate bilateral pleural effusions and bilateral airspace disease, right greater than left. Electronically Signed   By: Charlett Nose M.D.   On: 12/20/2017 11:18   Dg Chest 2 View  Result Date: 12/07/2017 CLINICAL DATA:  67 year old male with shortness of breath and cough. EXAM: CHEST - 2 VIEW COMPARISON:  Chest radiograph dated 04/04/2017 FINDINGS: Large area of consolidative change at the right lung base abutting the lateral pleural surface. Although this may represent pneumonia a mass is not excluded. Clinical correlation and follow-up to resolution recommended. There is a small right pleural effusion. The left lung is clear. No pneumothorax. The cardiac silhouette is within normal limits. No acute osseous pathology. IMPRESSION: Right lower lobe consolidation may represent pneumonia. Clinical correlation and follow-up to resolution recommended to exclude an underlying mass. Trace right pleural effusion. Electronically Signed   By: Elgie Collard M.D.   On: 12/07/2017 02:25   Ct Head Wo Contrast  Result Date: 12/07/2017 CLINICAL DATA:  67 year old male with shortness of breath and altered mental status. EXAM: CT HEAD WITHOUT CONTRAST TECHNIQUE: Contiguous axial images were obtained from the base of the skull through the vertex without intravenous contrast. COMPARISON:  None. FINDINGS: Brain: The ventricles and sulci appropriate size for patient's age. Mild periventricular and deep white matter chronic microvascular ischemic changes noted. 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 There is no acute intracranial hemorrhage. No mass effect or midline shift. No extra-axial fluid collection. Vascular: No hyperdense vessel or unexpected calcification. Skull: Normal. Negative for fracture or focal lesion. Sinuses/Orbits: Mild diffuse mucoperiosteal thickening of paranasal sinuses. No air-fluid levels. The mastoid air cells are  clear. Other: None IMPRESSION: 1. No acute intracranial pathology. 2. Mild chronic microvascular ischemic changes. Electronically Signed   By: Elgie Collard M.D.   On: 12/07/2017 02:23   Ct Chest W Contrast  Result Date: 12/07/2017 CLINICAL DATA:  Cough and shortness of breath, HIV. Unintentional weight loss. EXAM: CT CHEST WITH CONTRAST TECHNIQUE: Multidetector CT imaging of the chest was performed  during intravenous contrast administration. CONTRAST:  OMNIPAQUE IOHEXOL 300 MG/ML  SOLN COMPARISON:  Chest radiographs 12/07/2017 and CT chest 03/30/2017. FINDINGS: Cardiovascular: Atherosclerotic calcification of the arterial vasculature, including coronary arteries. Heart is at the upper limits normal in size to mildly enlarged. No pericardial effusion. Mediastinum/Nodes: Mediastinal lymph nodes measure up to 9 mm in the low right paratracheal station. Hilar lymph nodes measure up to 1.4 cm on the right. No axillary adenopathy. Esophagus is grossly unremarkable. Lungs/Pleura: Right lower lobe consolidation with areas of decreased attenuation, air and air-fluid levels. Highly loculated large right pleural effusion. Centrilobular and paraseptal emphysema. Subpleural ground-glass in the posterior left upper lobe, likely infectious or inflammatory in etiology. Small left pleural effusion, simple in appearance, with minimal compressive atelectasis in the left lower lobe. Upper Abdomen: Visualized portion of the liver is mildly heterogeneous, which may be due to arterial phase imaging. There are 2 areas of hyper attenuation within the right hepatic lobe, measuring up to 1.6 cm visualized portions of the adrenal glands, kidneys, spleen, pancreas, stomach and bowel are grossly unremarkable. Upper abdominal lymph nodes are not enlarged by CT size criteria. Musculoskeletal: No worrisome lytic or sclerotic lesions. IMPRESSION: 1. Right lower lobe consolidation is likely due to pneumonia. Internal fluid, air and  air-fluid levels indicate associated necrosis/abscess formation. An underlying mass cannot be definitively excluded. Follow-up to clearing is recommended. 2. Large loculated right pleural effusion is indicative of an empyema. 3. Right hilar adenopathy, likely reactive. This can also be re-evaluated on follow-up imaging. 4. Small left pleural effusion. 5. Hyperattenuating lesions in the right hepatic lobe are difficult to further characterize but may represent perfusion anomalies or flash fill hemangiomas. If further evaluation is desired, MR abdomen without and with contrast is recommended. 6. Aortic atherosclerosis (ICD10-170.0). Coronary artery calcification. 7.  Emphysema (ICD10-J43.9). Electronically Signed   By: Leanna Battles M.D.   On: 12/07/2017 12:57   US Scrotum  Result Date: 12/20/2017 CLINICAL DATA:  Edema. EXAM: ULTRASOUND OF SCROTUM TECHNIQUE: Complete ultrasound examination of the testicles, epididymis, and other scrotal structures was performed. COMPARISON:  None. FINDINGS: Right testicle Measurements: 3.2 x 1.9 x 1.9 cm. No masses. Multiple microcalcifications throughout the right testicle. Left testicle Measurements: 3.3 x 2.0 x 2.0 cm. No masses. Microlithiasis identified. Right epididymis: Possible calcifications in the epididymis of doubtful significance. Left epididymis: Possible punctate calcifications in the epididymis of doubtful significance. Hydrocele:  None visualized. Varicocele:  None visualized. IMPRESSION: 1. Marked skin thickening in the scrotum/perineum. 2. Testicular microlithiasis. Current literature suggests that testicular microlithiasis is not a significant independent risk factor for development of testicular carcinoma, and that follow up imaging is not warranted in the absence of other risk factors. Monthly testicular self-examination and annual physical exams are considered appropriate surveillance. If patient has other risk factors for testicular carcinoma, then  referral to Urology should be considered. (Reference: DeCastro, et al.: A 5-Year Follow up Study of Asymptomatic Men with Testicular Microlithiasis. J Urol 2008; 179:1420-1423.) 3. No other significant abnormalities. Electronically Signed   By: Gerome Sam III M.D   On: 12/20/2017 17:19   Dg Chest Port 1 View  Result Date: 12/16/2017 CLINICAL DATA:  Follow-up chest tube EXAM: PORTABLE CHEST 1 VIEW COMPARISON:  12/14/2017 FINDINGS: Cardiac shadow is stable. Right-sided chest tube is again seen. Fluid is noted loculated within the minor fissure as well as inferiorly on the right. A small left pleural effusion is again seen and stable. No new focal infiltrate is noted. No bony  abnormality is seen. IMPRESSION: No significant interval change from the prior exam. Electronically Signed   By: Alcide Clever M.D.   On: 12/16/2017 07:22   Dg Chest Port 1 View  Result Date: 12/14/2017 CLINICAL DATA:  Dyspnea. EXAM: PORTABLE CHEST 1 VIEW COMPARISON:  12/12/2017. FINDINGS: Stable cardiomediastinal silhouette. Pigtail catheter RIGHT lower chest is unchanged, along with asymmetric RIGHT lung opacity. No pneumothorax. IMPRESSION: Stable chest. Electronically Signed   By: Elsie Stain M.D.   On: 12/14/2017 07:17   Dg Chest Port 1 View  Result Date: 12/12/2017 CLINICAL DATA:  Dyspnea, pleural effusions, right-sided chest tube. EXAM: PORTABLE CHEST 1 VIEW COMPARISON:  Portable chest x-ray of December 11, 2017 FINDINGS: Confluent airspace opacity persists in the right mid and lower lung. There is a moderate amount of pleural fluid on the right which is stable. The right pigtail pleural drainage catheter is in stable position inferiorly. On the left the lung is better inflated. There is left basilar atelectasis. There is a small left pleural effusion. The heart is top-normal in size. The pulmonary vascularity is normal. There is calcification in the wall of the aortic arch. The bony thorax is unremarkable. IMPRESSION:  Persistent right basilar atelectasis and/or pneumonia with moderate-sized right pleural effusion. Stable positioning of the pigtail catheter on the right. No pneumothorax. Stable small left pleural effusion and basilar atelectasis. Thoracic aortic atherosclerosis. Electronically Signed   By: David  [place] M.D.   On: 12/12/2017 07:49   Dg Chest Port 1 View  Result Date: 12/11/2017 CLINICAL DATA:  Shortness of breath. EXAM: PORTABLE CHEST 1 VIEW COMPARISON:  12/10/2017 FINDINGS: Pigtail drainage catheter over right lung base unchanged. No evidence pneumothorax. Lungs are adequately inflated with persistent opacification over the right mid to lower lung unchanged likely right-sided effusion with atelectasis. Possible very small amount of left pleural fluid unchanged. Cardiomediastinal silhouette and remainder the exam is unchanged. IMPRESSION: Stable opacification over the right mid to lower lung likely effusion with atelectasis. Pigtail drainage catheter over the right base unchanged. No pneumothorax. Tiny stable amount left pleural fluid. Electronically Signed   By: Elberta Fortis M.D.   On: 12/11/2017 10:51   Dg Chest Port 1 View  Result Date: 12/10/2017 CLINICAL DATA:  Empyema.  Status post drainage catheter placement. EXAM: PORTABLE CHEST 1 VIEW COMPARISON:  CT scan 12/07/2017 and chest x-ray, same date. FINDINGS: The cardiac silhouette, mediastinal and hilar contours are within normal limits and stable. The left lung remains clear. There is a pigtail catheter noted at the right lung base which was placed into the empyema cavity. The loculated right basilar fluid collection is smaller. There is surrounding pneumonia and atelectasis which also appears slightly improved. IMPRESSION: Right basilar pleural drainage catheter in good position with decreased loculated right pleural fluid collection/empyema. Persistent right lower lobe pneumonia and atelectasis but it may be slightly improved. The left lung remains  clear. Electronically Signed   By: Rudie Meyer M.D.   On: 12/10/2017 08:40   Ct Image Guided Drainage By Percutaneous Catheter  Result Date: 12/10/2017 INDICATION: Sepsis, pneumonia, HIV, right parapneumonic effusion versus empyema EXAM: CT-GUIDED 14 FRENCH RIGHT CHEST TUBE INSERTION MEDICATIONS: The patient is currently admitted to the hospital and receiving intravenous antibiotics. The antibiotics were administered within an appropriate time frame prior to the initiation of the procedure. ANESTHESIA/SEDATION: 2.0 mg IV Versed 100 mcg IV Fentanyl Moderate Sedation Time:  20 MINUTES The patient was continuously monitored during the procedure by the interventional radiology nurse under my direct  supervision. COMPLICATIONS: None immediate. TECHNIQUE: Informed written consent was obtained from the patient after a thorough discussion of the procedural risks, benefits and alternatives. All questions were addressed. Maximal Sterile Barrier Technique was utilized including caps, mask, sterile gowns, sterile gloves, sterile drape, hand hygiene and skin antiseptic. A timeout was performed prior to the initiation of the procedure. PROCEDURE: The right lateral chest was prepped with ChloraPrep in a sterile fashion, and a sterile drape was applied covering the operative field. A sterile gown and sterile gloves were used for the procedure. Local anesthesia was provided with 1% Lidocaine. Previous imaging reviewed. Patient positioned supine. Noncontrast localization CT performed. The large loculated right pleural effusion was localized. A right lateral approach mid axillary line was marked. Under sterile conditions and local anesthesia, an 18 gauge 10 cm access needle was advanced percutaneously right lateral axillary approach into the pleural effusion. There was return of exudative fluid. Amplatz guidewire inserted followed by tract dilatation to insert a 14 French drain. Drain catheter position confirmed with CT. Chest  tube connected to pleura vac. 400 cc removed. Sample sent for culture. Catheter secured with a Prolene suture and a sterile dressing. No immediate complication. Patient tolerated the procedure well. FINDINGS: Imaging confirms needle placed in the right loculated effusion for drain insertion IMPRESSION: Successful CT-guided 14 French right chest tube insertion Electronically Signed   By: Judie Petit.  Shick M.D.   On: 12/10/2017 08:17    Lab Data:  CBC: Recent Labs  Lab 12/16/17 0525 12/19/17 0442 12/20/17 0657 12/21/17 0714  WBC 8.8 7.9 7.5 6.3  NEUTROABS 6.7  --   --   --   HGB 10.2* 9.1* 9.9* 9.8*  HCT 34.2* 29.2* 32.0* 31.4*  MCV 108.2* 103.2* 101.9* 101.3*  PLT 288 258 295 272   Basic Metabolic Panel: Recent Labs  Lab 12/17/17 0452 12/18/17 0800 12/19/17 0442 12/20/17 0431 12/21/17 0714  NA 138 139 140 139 143  K 3.4* 3.2* 3.7 4.3 3.8  CL 108 109 110 111 111  CO2 24 22 25 24 27   GLUCOSE 109* 116* 126* 114* 117*  BUN 5* 6* <5* 6* 6*  CREATININE 0.71 0.64 0.61 0.60* 0.60*  CALCIUM 6.8* 6.8* 6.8* 7.1* 7.5*  MG  --  1.0* 1.2*  --   --    GFR: Estimated Creatinine Clearance: 68.2 mL/min (A) (by C-G formula based on SCr of 0.6 mg/dL (L)). Liver Function Tests: Recent Labs  Lab 12/17/17 0452 12/18/17 0800 12/19/17 0442 12/20/17 0431 12/21/17 0714  AST 146* 125* 97* 86* 76*  ALT 219* 181* 137* 111* 101*  ALKPHOS 79 91 81 79 82  BILITOT 0.8 0.8 0.5 0.8 0.7  PROT 5.7* 6.0* 5.6* 4.9* 5.7*  ALBUMIN 1.4* 1.5* 1.3* 1.3* 1.4*   No results for input(s): LIPASE, AMYLASE in the last 168 hours. No results for input(s): AMMONIA in the last 168 hours. Coagulation Profile: No results for input(s): INR, PROTIME in the last 168 hours. Cardiac Enzymes: Recent Labs  Lab 12/18/17 0800 12/18/17 1504 12/18/17 2052  TROPONINI 0.05* 0.06* 0.05*   BNP (last 3 results) No results for input(s): PROBNP in the last 8760 hours. HbA1C: No results for input(s): HGBA1C in the last 72  hours. CBG: No results for input(s): GLUCAP in the last 168 hours. Lipid Profile: No results for input(s): CHOL, HDL, LDLCALC, TRIG, CHOLHDL, LDLDIRECT in the last 72 hours. Thyroid Function Tests: No results for input(s): TSH, T4TOTAL, FREET4, T3FREE, THYROIDAB in the last 72 hours. Anemia Panel: No  results for input(s): VITAMINB12, FOLATE, FERRITIN, TIBC, IRON, RETICCTPCT in the last 72 hours. Urine analysis:    Component Value Date/Time   COLORURINE YELLOW 12/07/2017 0300   APPEARANCEUR HAZY (A) 12/07/2017 0300   LABSPEC 1.010 12/07/2017 0300   PHURINE 6.0 12/07/2017 0300   GLUCOSEU NEGATIVE 12/07/2017 0300   HGBUR SMALL (A) 12/07/2017 0300   BILIRUBINUR NEGATIVE 12/07/2017 0300   KETONESUR NEGATIVE 12/07/2017 0300   PROTEINUR NEGATIVE 12/07/2017 0300   NITRITE NEGATIVE 12/07/2017 0300   LEUKOCYTESUR NEGATIVE 12/07/2017 0300     Zeniyah Peaster M.D. [facility] 12/21/2017, 1:02 PM  Pager: [phone] Between 7am to 7pm - call Pager - [phone]  After 7pm go to [email or web address] - password [email or web address]  Call night coverage person covering after 7pm

## 2017-12-22 DIAGNOSIS — Z681 Body mass index (BMI) 19 or less, adult: Secondary | ICD-10-CM

## 2017-12-22 DIAGNOSIS — L8992 Pressure ulcer of unspecified site, stage 2: Secondary | ICD-10-CM

## 2017-12-22 DIAGNOSIS — E876 Hypokalemia: Secondary | ICD-10-CM

## 2017-12-22 LAB — COMPREHENSIVE METABOLIC PANEL
ALT: 97 U/L — ABNORMAL HIGH (ref 0–44)
ANION GAP: 8 (ref 5–15)
AST: 83 U/L — ABNORMAL HIGH (ref 15–41)
Albumin: 1.6 g/dL — ABNORMAL LOW (ref 3.5–5.0)
Alkaline Phosphatase: 98 U/L (ref 38–126)
BILIRUBIN TOTAL: 0.9 mg/dL (ref 0.3–1.2)
BUN: 6 mg/dL — AB (ref 8–23)
CO2: 31 mmol/L (ref 22–32)
Calcium: 7.6 mg/dL — ABNORMAL LOW (ref 8.9–10.3)
Chloride: 103 mmol/L (ref 98–111)
Creatinine, Ser: 0.71 mg/dL (ref 0.61–1.24)
Glucose, Bld: 89 mg/dL (ref 70–99)
POTASSIUM: 2.7 mmol/L — AB (ref 3.5–5.1)
Sodium: 142 mmol/L (ref 135–145)
TOTAL PROTEIN: 6.6 g/dL (ref 6.5–8.1)

## 2017-12-22 LAB — HIV-1 RNA QUANT-NO REFLEX-BLD: LOG10 HIV-1 RNA: UNDETERMINED {Log_copies}/mL

## 2017-12-22 LAB — CBC
HCT: 32.9 % — ABNORMAL LOW (ref 39.0–52.0)
Hemoglobin: 10.8 g/dL — ABNORMAL LOW (ref 13.0–17.0)
MCH: 31.9 pg (ref 26.0–34.0)
MCHC: 32.8 g/dL (ref 30.0–36.0)
MCV: 97.1 fL (ref 78.0–100.0)
Platelets: 309 10*3/uL (ref 150–400)
RBC: 3.39 MIL/uL — ABNORMAL LOW (ref 4.22–5.81)
RDW: 12.9 % (ref 11.5–15.5)
WBC: 6.2 10*3/uL (ref 4.0–10.5)

## 2017-12-22 LAB — MAGNESIUM: MAGNESIUM: 0.9 mg/dL — AB (ref 1.7–2.4)

## 2017-12-22 LAB — POTASSIUM: POTASSIUM: 3.2 mmol/L — AB (ref 3.5–5.1)

## 2017-12-22 MED ORDER — POTASSIUM CHLORIDE CRYS ER 20 MEQ PO TBCR
30.0000 meq | EXTENDED_RELEASE_TABLET | ORAL | Status: DC
  Administered 2017-12-22: 30 meq via ORAL
  Filled 2017-12-22: qty 1

## 2017-12-22 MED ORDER — MAGNESIUM SULFATE 50 % IJ SOLN: 4.0000 g | Freq: Once | INTRAVENOUS | Status: DC

## 2017-12-22 MED ORDER — POTASSIUM CHLORIDE 10 MEQ/100ML IV SOLN
10.0000 meq | INTRAVENOUS | Status: AC
Start: 2017-12-22 — End: 2017-12-22
  Administered 2017-12-22 (×3): 10 meq via INTRAVENOUS
  Filled 2017-12-22 (×3): qty 100

## 2017-12-22 MED ORDER — POTASSIUM CHLORIDE CRYS ER 20 MEQ PO TBCR
40.0000 meq | EXTENDED_RELEASE_TABLET | ORAL | Status: AC
  Administered 2017-12-22: 40 meq via ORAL
  Filled 2017-12-22: qty 2

## 2017-12-22 MED ORDER — AMOXICILLIN-POT CLAVULANATE 875-125 MG PO TABS
1.0000 | ORAL_TABLET | Freq: Two times a day (BID) | ORAL | Status: DC
  Administered 2017-12-22 – 2017-12-27 (×11): 1 via ORAL
  Filled 2017-12-22 (×12): qty 1

## 2017-12-22 MED ORDER — MAGNESIUM SULFATE 4 GM/100ML IV SOLN
4.0000 g | Freq: Once | INTRAVENOUS | Status: AC
  Administered 2017-12-22: 4 g via INTRAVENOUS
  Filled 2017-12-22: qty 100

## 2017-12-22 MED ORDER — FUROSEMIDE 10 MG/ML IJ SOLN
40.0000 mg | Freq: Two times a day (BID) | INTRAMUSCULAR | Status: DC
  Administered 2017-12-22: 40 mg via INTRAVENOUS
  Filled 2017-12-22: qty 4

## 2017-12-22 NOTE — Progress Notes (Signed)
CSW met with patient at bedside to follow up on SNF bed choice. Patient did not have a choice, indicated his mother and sister, [name], had taken the list with them yesterday. Patient gave permission to speak to mother and sister about choice. CSW left voicemail for mother, awaiting call back.   Kevin Jimenez, [place]

## 2017-12-22 NOTE — Progress Notes (Signed)
[facility]                                                                              Patient Demographics  Kevin Jimenez, is a 67 y.o. male, DOB - Jul 20, 1950, HYW:[id number]  Admit date - 12/07/2017   Admitting Physician Briscoe Deutscher, MD  Outpatient Primary MD for the patient is Fleet Contras, MD  Outpatient specialists:   LOS - 15  days   Medical records reviewed and are as summarized below:    Chief Complaint  Patient presents with  . Fatigue       Brief summary   Patient is a 67 year old male with past medical history of HIV, chronic hepatitis C, noncompliant with Harvoni, hypertension, polysubstance abuse including heroin, tobacco abuse, history of a spontaneous pneumothorax who  is being managed for right-sided pneumonia with parapneumonic effusion and empyema.  He is status post right-sided chest tube placement.  He was admitted on 7/17 when he presented to the emergency department with complaints of weight loss, shortness of breath and cough.  Assessment & Plan   Principal problem Right-sided pneumonia with  lung abscess/empyema -Large right-sided pleural effusion, CT VS was consulted, chest tube was placed -Management per CT surgery, chest tubes removed  -ID following, recommended continue Unasyn while inpatient and then transition to Augmentin for 10 days ending on 12/31/2017   Active problems Dyspnea secondary to acute on chronic systolic CHF exacerbation, bilateral pleural effusion likely due to volume overload - At the time of my examination, patient has shallow breathing, bibasilar crackles, peripheral edema with penile and scrotal edema -Isodose with 15.7 L positive, at the time of admission weight 121lbs-> 160 lbs on 7/30-> 90lbs today (?) - BNP 1238, scrotal ultrasound did not show any acute issues -Reviewed 2D echo done on 7/20 which had shown EF of 30 to 35% with severe hypokinesis at the basal to mid ventricular level -Shortness of  breath much improving, decrease IV Lasix to every 12 hours, will likely not need Lasix at the time of discharge.  Hypokalemia, hypomagnesemia -Replaced IV and oral  Paroxysmal atrial fibrillation with RVR -Currently rate controlled, patient had RVR on 7/27 requiring IV Cardizem and IV metoprolol -Decreased oral Cardizem and metoprolol to allow diuresis -Not a candidate for chronic anticoagulation due to comorbidities empyema and noncompliance  HIV/AIDS -Noncompliant with medications, continue Biktarvy -ID following  Chronic hepatitis C -Outpatient treatment.  Noncompliant with Harvoni  Failure to thrive -Continue Remeron, nutritional supplements, PT  Transaminitis -Improving, hepatitis C versus side effect of Biktarvy  Acute kidney injury with hyponatremia -Resolved, creatinine currently 0.6, monitor closely with aggressive diuresis  Stage II sacral pressure ulcer -Frequent turning, encourage ambulation, wound care  Code Status: DNR DVT Prophylaxis:  SCD's Family Communication: Discussed in detail with the patient, all imaging results, lab results explained to the patient    Disposition Plan: Skilled nursing facility when bed available, possibly tomorrow or Saturday   Time Spent in minutes   25 minutes  Procedures:  Chest tube placement  Consultants:   Infectious disease CT VS  Antimicrobials:   IV Unasyn   Medications  Scheduled Meds: .  amoxicillin-clavulanate  1 tablet Oral Q12H  . bictegravir-emtricitabine-tenofovir AF  1 tablet Oral Daily  . diltiazem  120 mg Oral Daily  . feeding supplement  1 Container Oral BID BM  . feeding supplement (ENSURE ENLIVE)  237 mL Oral BID BM  . folic acid  1 mg Oral Daily  . furosemide  40 mg Intravenous Q8H  . Gerhardt's butt cream   Topical BID  . guaiFENesin  600 mg Oral BID  . metoprolol tartrate  25 mg Oral BID  . multivitamin with minerals  1 tablet Oral Daily  . nicotine  14 mg Transdermal Daily  . sodium  chloride flush  5 mL Intracatheter Q8H  . thiamine  100 mg Oral Daily  . traZODone  100 mg Oral QHS   Continuous Infusions:  PRN Meds:.benzonatate, haloperidol lactate, hydrALAZINE, metoprolol tartrate, ondansetron **OR** ondansetron (ZOFRAN) IV   Antibiotics   Anti-infectives (From admission, onward)   Start     Dose/Rate Route Frequency Ordered Stop   12/22/17 1800  amoxicillin-clavulanate (AUGMENTIN) 875-125 MG per tablet 1 tablet     1 tablet Oral Every 12 hours 12/22/17 1429 01/01/18 2159   12/08/17 1430  Ampicillin-Sulbactam (UNASYN) 3 g in sodium chloride 0.9 % 100 mL IVPB  Status:  Discontinued     3 g 200 mL/hr over 30 Minutes Intravenous Every 8 hours 12/08/17 0915 12/22/17 1429   12/08/17 0630  azithromycin (ZITHROMAX) 500 mg in sodium chloride 0.9 % 250 mL IVPB  Status:  Discontinued     500 mg 250 mL/hr over 60 Minutes Intravenous Every 24 hours 12/07/17 1014 12/07/17 1442   12/08/17 0600  cefTRIAXone (ROCEPHIN) 2 g in sodium chloride 0.9 % 100 mL IVPB  Status:  Discontinued     2 g 200 mL/hr over 30 Minutes Intravenous Every 24 hours 12/07/17 1014 12/07/17 1442   12/08/17 0300  vancomycin (VANCOCIN) 500 mg in sodium chloride 0.9 % 100 mL IVPB  Status:  Discontinued     500 mg 100 mL/hr over 60 Minutes Intravenous Every 12 hours 12/07/17 1455 12/09/17 1423   12/07/17 1800  bictegravir-emtricitabine-tenofovir AF (BIKTARVY) 50-200-25 MG per tablet 1 tablet     1 tablet Oral Daily 12/07/17 1651     12/07/17 1500  vancomycin (VANCOCIN) IVPB 1000 mg/200 mL premix     1,000 mg 200 mL/hr over 60 Minutes Intravenous  Once 12/07/17 1455 12/07/17 1638   12/07/17 1500  piperacillin-tazobactam (ZOSYN) IVPB 3.375 g  Status:  Discontinued     3.375 g 12.5 mL/hr over 240 Minutes Intravenous Every 8 hours 12/07/17 1455 12/08/17 0915   12/07/17 0245  cefTRIAXone (ROCEPHIN) 2 g in sodium chloride 0.9 % 100 mL IVPB  Status:  Discontinued     2 g 200 mL/hr over 30 Minutes Intravenous  Every 24 hours 12/07/17 0236 12/07/17 1007   12/07/17 0245  azithromycin (ZITHROMAX) 500 mg in sodium chloride 0.9 % 250 mL IVPB  Status:  Discontinued     500 mg 250 mL/hr over 60 Minutes Intravenous Every 24 hours 12/07/17 0236 12/07/17 1007        Subjective:   Kevin Jimenez was seen and examined today.  Feeling a lot better today, shortness of breath improving, scrotal and penile edema improving,.  Denies any dizziness, abdominal pain, nausea vomiting or diarrhea.  No acute events overnight.   Objective:   Vitals:   12/22/17 0454 12/22/17 0600 12/22/17 0940 12/22/17 1458  BP: (!) 146/86  (!) 142/87 130/80  Pulse: 81  83 73  Resp:      Temp: 97.7 F (36.5 C)   97.6 F (36.4 C)  TempSrc: Oral   Oral  SpO2: 100%   99%  Weight:  40.9 kg (90 lb 2.7 oz)    Height:        Intake/Output Summary (Last 24 hours) at 12/22/2017 1509 Last data filed at 12/22/2017 1136 Gross per 24 hour  Intake 1010.22 ml  Output 4000 ml  Net -2989.78 ml     Wt Readings from Last 3 Encounters:  12/22/17 40.9 kg (90 lb 2.7 oz)  04/04/17 52.2 kg (115 lb)  03/30/17 53.1 kg (117 lb)     Exam   General: Alert and oriented x 3, NAD Eyes:  HEENT:  Atraumatic, normocephalic Cardiovascular: S1 S2 auscultated,  Regular rate and rhythm. 1+ pedal edema b/l Respiratory: Decreased breath sounds at the bases, improving Gastrointestinal: Soft, nontender, nondistended, + bowel sounds Ext: 1+ pedal edema bilaterally Neuro: no new deficit Musculoskeletal: No digital cyanosis, clubbing Skin: No rashes Psych: Normal affect and demeanor, alert and oriented x3   GU: Scrotal and penile edema improving    Data Reviewed:  I have personally reviewed following labs and imaging studies  Micro Results No results found for this or any previous visit (from the past 240 hour(s)).  Radiology Reports Dg Chest 2 View  Result Date: 12/20/2017 CLINICAL DATA:  Chest tube, shortness of breath EXAM: CHEST - 2 VIEW  COMPARISON:  12/16/2017 FINDINGS: Right basilar chest tube remains in place. Continued loculated right basilar hydropneumothorax. Moderate bilateral pleural effusions. Cardiomegaly. Bilateral airspace disease again noted, right greater than left. IMPRESSION: No real change since prior study. Continued loculated pneumothorax at the right lung base with moderate bilateral pleural effusions and bilateral airspace disease, right greater than left. Electronically Signed   By: Charlett Nose M.D.   On: 12/20/2017 11:18   Dg Chest 2 View  Result Date: 12/07/2017 CLINICAL DATA:  67 year old male with shortness of breath and cough. EXAM: CHEST - 2 VIEW COMPARISON:  Chest radiograph dated 04/04/2017 FINDINGS: Large area of consolidative change at the right lung base abutting the lateral pleural surface. Although this may represent pneumonia a mass is not excluded. Clinical correlation and follow-up to resolution recommended. There is a small right pleural effusion. The left lung is clear. No pneumothorax. The cardiac silhouette is within normal limits. No acute osseous pathology. IMPRESSION: Right lower lobe consolidation may represent pneumonia. Clinical correlation and follow-up to resolution recommended to exclude an underlying mass. Trace right pleural effusion. Electronically Signed   By: Elgie Collard M.D.   On: 12/07/2017 02:25   Ct Head Wo Contrast  Result Date: 12/07/2017 CLINICAL DATA:  67 year old male with shortness of breath and altered mental status. EXAM: CT HEAD WITHOUT CONTRAST TECHNIQUE: Contiguous axial images were obtained from the base of the skull through the vertex without intravenous contrast. COMPARISON:  None. FINDINGS: Brain: The ventricles and sulci appropriate size for patient's age. Mild periventricular and deep white matter chronic microvascular ischemic changes noted. 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 There is no acute intracranial  hemorrhage. No mass effect or midline shift. No extra-axial fluid collection. Vascular: No hyperdense vessel or unexpected calcification. Skull: Normal. Negative for fracture or focal lesion. Sinuses/Orbits: Mild diffuse mucoperiosteal thickening of paranasal sinuses. No air-fluid levels. The mastoid air cells are clear. Other: None IMPRESSION: 1. No acute intracranial pathology. 2. Mild chronic microvascular  ischemic changes. Electronically Signed   By: Elgie Collard M.D.   On: 12/07/2017 02:23   Ct Chest W Contrast  Result Date: 12/07/2017 CLINICAL DATA:  Cough and shortness of breath, HIV. Unintentional weight loss. EXAM: CT CHEST WITH CONTRAST TECHNIQUE: Multidetector CT imaging of the chest was performed during intravenous contrast administration. CONTRAST:  OMNIPAQUE IOHEXOL 300 MG/ML  SOLN COMPARISON:  Chest radiographs 12/07/2017 and CT chest 03/30/2017. FINDINGS: Cardiovascular: Atherosclerotic calcification of the arterial vasculature, including coronary arteries. Heart is at the upper limits normal in size to mildly enlarged. No pericardial effusion. Mediastinum/Nodes: Mediastinal lymph nodes measure up to 9 mm in the low right paratracheal station. Hilar lymph nodes measure up to 1.4 cm on the right. No axillary adenopathy. Esophagus is grossly unremarkable. Lungs/Pleura: Right lower lobe consolidation with areas of decreased attenuation, air and air-fluid levels. Highly loculated large right pleural effusion. Centrilobular and paraseptal emphysema. Subpleural ground-glass in the posterior left upper lobe, likely infectious or inflammatory in etiology. Small left pleural effusion, simple in appearance, with minimal compressive atelectasis in the left lower lobe. Upper Abdomen: Visualized portion of the liver is mildly heterogeneous, which may be due to arterial phase imaging. There are 2 areas of hyper attenuation within the right hepatic lobe, measuring up to 1.6 cm visualized portions of  the adrenal glands, kidneys, spleen, pancreas, stomach and bowel are grossly unremarkable. Upper abdominal lymph nodes are not enlarged by CT size criteria. Musculoskeletal: No worrisome lytic or sclerotic lesions. IMPRESSION: 1. Right lower lobe consolidation is likely due to pneumonia. Internal fluid, air and air-fluid levels indicate associated necrosis/abscess formation. An underlying mass cannot be definitively excluded. Follow-up to clearing is recommended. 2. Large loculated right pleural effusion is indicative of an empyema. 3. Right hilar adenopathy, likely reactive. This can also be re-evaluated on follow-up imaging. 4. Small left pleural effusion. 5. Hyperattenuating lesions in the right hepatic lobe are difficult to further characterize but may represent perfusion anomalies or flash fill hemangiomas. If further evaluation is desired, MR abdomen without and with contrast is recommended. 6. Aortic atherosclerosis (ICD10-170.0). Coronary artery calcification. 7.  Emphysema (ICD10-J43.9). Electronically Signed   By: Leanna Battles M.D.   On: 12/07/2017 12:57   US Scrotum  Result Date: 12/20/2017 CLINICAL DATA:  Edema. EXAM: ULTRASOUND OF SCROTUM TECHNIQUE: Complete ultrasound examination of the testicles, epididymis, and other scrotal structures was performed. COMPARISON:  None. FINDINGS: Right testicle Measurements: 3.2 x 1.9 x 1.9 cm. No masses. Multiple microcalcifications throughout the right testicle. Left testicle Measurements: 3.3 x 2.0 x 2.0 cm. No masses. Microlithiasis identified. Right epididymis: Possible calcifications in the epididymis of doubtful significance. Left epididymis: Possible punctate calcifications in the epididymis of doubtful significance. Hydrocele:  None visualized. Varicocele:  None visualized. IMPRESSION: 1. Marked skin thickening in the scrotum/perineum. 2. Testicular microlithiasis. Current literature suggests that testicular microlithiasis is not a significant  independent risk factor for development of testicular carcinoma, and that follow up imaging is not warranted in the absence of other risk factors. Monthly testicular self-examination and annual physical exams are considered appropriate surveillance. If patient has other risk factors for testicular carcinoma, then referral to Urology should be considered. (Reference: DeCastro, et al.: A 5-Year Follow up Study of Asymptomatic Men with Testicular Microlithiasis. J Urol 2008; 179:1420-1423.) 3. No other significant abnormalities. Electronically Signed   By: Gerome Sam III M.D   On: 12/20/2017 17:19   Dg Chest Port 1 View  Result Date: 12/16/2017 CLINICAL DATA:  Follow-up chest tube  EXAM: PORTABLE CHEST 1 VIEW COMPARISON:  12/14/2017 FINDINGS: Cardiac shadow is stable. Right-sided chest tube is again seen. Fluid is noted loculated within the minor fissure as well as inferiorly on the right. A small left pleural effusion is again seen and stable. No new focal infiltrate is noted. No bony abnormality is seen. IMPRESSION: No significant interval change from the prior exam. Electronically Signed   By: Alcide Clever M.D.   On: 12/16/2017 07:22   Dg Chest Port 1 View  Result Date: 12/14/2017 CLINICAL DATA:  Dyspnea. EXAM: PORTABLE CHEST 1 VIEW COMPARISON:  12/12/2017. FINDINGS: Stable cardiomediastinal silhouette. Pigtail catheter RIGHT lower chest is unchanged, along with asymmetric RIGHT lung opacity. No pneumothorax. IMPRESSION: Stable chest. Electronically Signed   By: Elsie Stain M.D.   On: 12/14/2017 07:17   Dg Chest Port 1 View  Result Date: 12/12/2017 CLINICAL DATA:  Dyspnea, pleural effusions, right-sided chest tube. EXAM: PORTABLE CHEST 1 VIEW COMPARISON:  Portable chest x-ray of December 11, 2017 FINDINGS: Confluent airspace opacity persists in the right mid and lower lung. There is a moderate amount of pleural fluid on the right which is stable. The right pigtail pleural drainage catheter is in stable  position inferiorly. On the left the lung is better inflated. There is left basilar atelectasis. There is a small left pleural effusion. The heart is top-normal in size. The pulmonary vascularity is normal. There is calcification in the wall of the aortic arch. The bony thorax is unremarkable. IMPRESSION: Persistent right basilar atelectasis and/or pneumonia with moderate-sized right pleural effusion. Stable positioning of the pigtail catheter on the right. No pneumothorax. Stable small left pleural effusion and basilar atelectasis. Thoracic aortic atherosclerosis. Electronically Signed   By: David  [place] M.D.   On: 12/12/2017 07:49   Dg Chest Port 1 View  Result Date: 12/11/2017 CLINICAL DATA:  Shortness of breath. EXAM: PORTABLE CHEST 1 VIEW COMPARISON:  12/10/2017 FINDINGS: Pigtail drainage catheter over right lung base unchanged. No evidence pneumothorax. Lungs are adequately inflated with persistent opacification over the right mid to lower lung unchanged likely right-sided effusion with atelectasis. Possible very small amount of left pleural fluid unchanged. Cardiomediastinal silhouette and remainder the exam is unchanged. IMPRESSION: Stable opacification over the right mid to lower lung likely effusion with atelectasis. Pigtail drainage catheter over the right base unchanged. No pneumothorax. Tiny stable amount left pleural fluid. Electronically Signed   By: Elberta Fortis M.D.   On: 12/11/2017 10:51   Dg Chest Port 1 View  Result Date: 12/10/2017 CLINICAL DATA:  Empyema.  Status post drainage catheter placement. EXAM: PORTABLE CHEST 1 VIEW COMPARISON:  CT scan 12/07/2017 and chest x-ray, same date. FINDINGS: The cardiac silhouette, mediastinal and hilar contours are within normal limits and stable. The left lung remains clear. There is a pigtail catheter noted at the right lung base which was placed into the empyema cavity. The loculated right basilar fluid collection is smaller. There is surrounding  pneumonia and atelectasis which also appears slightly improved. IMPRESSION: Right basilar pleural drainage catheter in good position with decreased loculated right pleural fluid collection/empyema. Persistent right lower lobe pneumonia and atelectasis but it may be slightly improved. The left lung remains clear. Electronically Signed   By: Rudie Meyer M.D.   On: 12/10/2017 08:40   Ct Image Guided Drainage By Percutaneous Catheter  Result Date: 12/10/2017 INDICATION: Sepsis, pneumonia, HIV, right parapneumonic effusion versus empyema EXAM: CT-GUIDED 14 FRENCH RIGHT CHEST TUBE INSERTION MEDICATIONS: The patient is currently admitted to the  hospital and receiving intravenous antibiotics. The antibiotics were administered within an appropriate time frame prior to the initiation of the procedure. ANESTHESIA/SEDATION: 2.0 mg IV Versed 100 mcg IV Fentanyl Moderate Sedation Time:  20 MINUTES The patient was continuously monitored during the procedure by the interventional radiology nurse under my direct supervision. COMPLICATIONS: None immediate. TECHNIQUE: Informed written consent was obtained from the patient after a thorough discussion of the procedural risks, benefits and alternatives. All questions were addressed. Maximal Sterile Barrier Technique was utilized including caps, mask, sterile gowns, sterile gloves, sterile drape, hand hygiene and skin antiseptic. A timeout was performed prior to the initiation of the procedure. PROCEDURE: The right lateral chest was prepped with ChloraPrep in a sterile fashion, and a sterile drape was applied covering the operative field. A sterile gown and sterile gloves were used for the procedure. Local anesthesia was provided with 1% Lidocaine. Previous imaging reviewed. Patient positioned supine. Noncontrast localization CT performed. The large loculated right pleural effusion was localized. A right lateral approach mid axillary line was marked. Under sterile conditions and  local anesthesia, an 18 gauge 10 cm access needle was advanced percutaneously right lateral axillary approach into the pleural effusion. There was return of exudative fluid. Amplatz guidewire inserted followed by tract dilatation to insert a 14 French drain. Drain catheter position confirmed with CT. Chest tube connected to pleura vac. 400 cc removed. Sample sent for culture. Catheter secured with a Prolene suture and a sterile dressing. No immediate complication. Patient tolerated the procedure well. FINDINGS: Imaging confirms needle placed in the right loculated effusion for drain insertion IMPRESSION: Successful CT-guided 14 French right chest tube insertion Electronically Signed   By: Judie Petit.  Shick M.D.   On: 12/10/2017 08:17    Lab Data:  CBC: Recent Labs  Lab 12/16/17 0525 12/19/17 0442 12/20/17 0657 12/21/17 0714 12/22/17 0401  WBC 8.8 7.9 7.5 6.3 6.2  NEUTROABS 6.7  --   --   --   --   HGB 10.2* 9.1* 9.9* 9.8* 10.8*  HCT 34.2* 29.2* 32.0* 31.4* 32.9*  MCV 108.2* 103.2* 101.9* 101.3* 97.1  PLT 288 258 295 272 309   Basic Metabolic Panel: Recent Labs  Lab 12/18/17 0800 12/19/17 0442 12/20/17 0431 12/21/17 0714 12/22/17 0401 12/22/17 0649  NA 139 140 139 143 142  --   K 3.2* 3.7 4.3 3.8 2.7*  --   CL 109 110 111 111 103  --   CO2 22 25 24 27 31   --   GLUCOSE 116* 126* 114* 117* 89  --   BUN 6* <5* 6* 6* 6*  --   CREATININE 0.64 0.61 0.60* 0.60* 0.71  --   CALCIUM 6.8* 6.8* 7.1* 7.5* 7.6*  --   MG 1.0* 1.2*  --   --   --  0.9*   GFR: Estimated Creatinine Clearance: 51.8 mL/min (by C-G formula based on SCr of 0.71 mg/dL). Liver Function Tests: Recent Labs  Lab 12/18/17 0800 12/19/17 0442 12/20/17 0431 12/21/17 0714 12/22/17 0401  AST 125* 97* 86* 76* 83*  ALT 181* 137* 111* 101* 97*  ALKPHOS 91 81 79 82 98  BILITOT 0.8 0.5 0.8 0.7 0.9  PROT 6.0* 5.6* 4.9* 5.7* 6.6  ALBUMIN 1.5* 1.3* 1.3* 1.4* 1.6*   No results for input(s): LIPASE, AMYLASE in the last 168  hours. No results for input(s): AMMONIA in the last 168 hours. Coagulation Profile: No results for input(s): INR, PROTIME in the last 168 hours. Cardiac Enzymes: Recent Labs  Lab 12/18/17 0800 12/18/17 1504 12/18/17 2052  TROPONINI 0.05* 0.06* 0.05*   BNP (last 3 results) No results for input(s): PROBNP in the last 8760 hours. HbA1C: No results for input(s): HGBA1C in the last 72 hours. CBG: No results for input(s): GLUCAP in the last 168 hours. Lipid Profile: No results for input(s): CHOL, HDL, LDLCALC, TRIG, CHOLHDL, LDLDIRECT in the last 72 hours. Thyroid Function Tests: No results for input(s): TSH, T4TOTAL, FREET4, T3FREE, THYROIDAB in the last 72 hours. Anemia Panel: No results for input(s): VITAMINB12, FOLATE, FERRITIN, TIBC, IRON, RETICCTPCT in the last 72 hours. Urine analysis:    Component Value Date/Time   COLORURINE YELLOW 12/07/2017 0300   APPEARANCEUR HAZY (A) 12/07/2017 0300   LABSPEC 1.010 12/07/2017 0300   PHURINE 6.0 12/07/2017 0300   GLUCOSEU NEGATIVE 12/07/2017 0300   HGBUR SMALL (A) 12/07/2017 0300   BILIRUBINUR NEGATIVE 12/07/2017 0300   KETONESUR NEGATIVE 12/07/2017 0300   PROTEINUR NEGATIVE 12/07/2017 0300   NITRITE NEGATIVE 12/07/2017 0300   LEUKOCYTESUR NEGATIVE 12/07/2017 0300     Shaye Elling M.D. [facility] 12/22/2017, 3:09 PM  Pager: [phone] Between 7am to 7pm - call Pager - [phone]  After 7pm go to [email or web address] - password [email or web address]  Call night coverage person covering after 7pm

## 2017-12-22 NOTE — Progress Notes (Signed)
Occupational Therapy Treatment Patient Details Name: Karma Hiney MRN: [id number] DOB: 1950/10/13 Today's Date: 12/22/2017    History of present illness Mikie Misner is a 67 y.o. M with a PMH of HIV (elite controller), Chronic Hep C (non compliant with Harvoni), HTN, polysubstance abuse (heroin, tobacco), PNA,  AKI. Pt admitted on 12/07/2017 to ED complaing of weight loss and SOB. Pt diagnosed with sepsis secondary to R sided PNA and Parapneumonica effusion and empyema.   OT comments  Patient progressing slowly. Pleasant and cooperative today, willing to engage and exit bed today.  Completed stand pivot with minA +2 using rollator to recliner.  Patient continues to fatigue easily, VSS throughout.  He will benefit from SNF rehab at dc in order to maximize strength and endurance prior to dc home.  Will continue to follow while admitted.    Follow Up Recommendations  SNF;Supervision/Assistance - 24 hour    Equipment Recommendations  None recommended by OT    Recommendations for Other Services      Precautions / Restrictions Precautions Precautions: Fall Restrictions Weight Bearing Restrictions: No       Mobility Bed Mobility Overal bed mobility: Needs Assistance             General bed mobility comments: PT to assist, see notes  Transfers Overall transfer level: Needs assistance Equipment used: 4-wheeled walker Transfers: Sit to/from Stand;Stand Pivot Transfers Sit to Stand: Min assist;+2 physical assistance Stand pivot transfers: Min assist;+2 physical assistance       General transfer comment: required minA +2 to power up to stand, given cueing for hand placement and safety; stand pivot with minA +2 with cueing for safety, A for posterior lean and walker management; fatigues easily    Balance Overall balance assessment: Needs assistance Sitting-balance support: Feet supported;No upper extremity supported Sitting balance-Leahy Scale: Fair     Standing balance  support: Bilateral upper extremity supported Standing balance-Leahy Scale: Poor Standing balance comment: reliant on BUE support, posterior lean                            ADL either performed or assessed with clinical judgement   ADL Overall ADL's : Needs assistance/impaired     Grooming: Supervision/safety;Sitting;Set up;Wash/dry face;Wash/dry Lawyer: Minimal assistance;+2 for physical assistance;Ambulation(rollator) Statistician Details (indicate cue type and reason): cueing for safety and technique, fatigues easily  Toileting- Clothing Manipulation and Hygiene: Maximal assistance;Bed level Toileting - Clothing Manipulation Details (indicate cue type and reason): urinal     Functional mobility during ADLs: Minimal assistance;+2 for physical assistance;Cueing for safety;Cueing for sequencing General ADL Comments: Patient with increased motivation to participate today, reports "feeling better" as he got out of the bed.     Vision       Perception     Praxis      Cognition Arousal/Alertness: Awake/alert Behavior During Therapy: WFL for tasks assessed/performed Overall Cognitive Status: Within Functional Limits for tasks assessed                                 General Comments: patient much more agreeable to participation today        Exercises     Shoulder Instructions       General Comments VSS     Pertinent Vitals/ Pain  Pain Assessment: Faces Faces Pain Scale: Hurts little more Pain Location: groin  Pain Descriptors / Indicators: Discomfort;Guarding Pain Intervention(s): Limited activity within patient's tolerance;Repositioned;Monitored during session  Home Living                                          Prior Functioning/Environment              Frequency  Min 2X/week        Progress Toward Goals  OT Goals(current goals can now be found in the care  plan section)  Progress towards OT goals: Progressing toward goals  Acute Rehab OT Goals Patient Stated Goal: to get stronger OT Goal Formulation: With patient Time For Goal Achievement: 12/27/17 Potential to Achieve Goals: Good  Plan Discharge plan remains appropriate;Frequency remains appropriate    Co-evaluation    PT/OT/SLP Co-Evaluation/Treatment: Yes Reason for Co-Treatment: To address functional/ADL transfers;Other (comment)(maximize success for participation)   OT goals addressed during session: ADL's and self-care      AM-PAC PT "6 Clicks" Daily Activity     Outcome Measure   Help from another person eating meals?: None Help from another person taking care of personal grooming?: None Help from another person toileting, which includes using toliet, bedpan, or urinal?: A Lot Help from another person bathing (including washing, rinsing, drying)?: A Little Help from another person to put on and taking off regular upper body clothing?: A Little Help from another person to put on and taking off regular lower body clothing?: A Lot 6 Click Score: 18    End of Session Equipment Utilized During Treatment: Gait belt;Other (comment)(rollator)  OT Visit Diagnosis: Other abnormalities of gait and mobility (R26.89);Muscle weakness (generalized) (M62.81)   Activity Tolerance Patient tolerated treatment well   Patient Left in chair;with chair alarm set;with call bell/phone within reach   Nurse Communication Mobility status        Time: 6767-2094 OT Time Calculation (min): 19 min  Charges: OT General Charges $OT Visit: 1 Visit  Chancy Milroy, OTR/L  Pager [phone]    Chancy Milroy 12/22/2017, 12:40 PM

## 2017-12-22 NOTE — Plan of Care (Signed)
  Problem: Activity: Goal: Risk for activity intolerance will decrease Outcome: Progressing   Problem: Coping: Goal: Level of anxiety will decrease Outcome: Progressing   

## 2017-12-22 NOTE — Care Management Important Message (Signed)
Important Message  Patient Details  Name: Kevin Jimenez MRN: [id number] Date of Birth: [birth year]   Medicare Important Message Given:  Yes    Kalyssa Anker P Ahniyah Giancola 12/22/2017, 3:43 PM

## 2017-12-22 NOTE — Progress Notes (Signed)
[facility]  Date of Admission:  12/07/2017     Total days of antibiotics 17         ASSESSMENT/PLAN  Mr. Seddon is a 67 y/o male with previous medical history of HIV, polysubstance abuse, hypertension, COPD, and Hepatitis C admitted to the hospital on 12/07/17 with the chief complaint of generalized weakness and cough and found to have a lung abscess and empyema.Status post chest tube for drainage with drain removed on 7/31.   Empyema - Drainage decreased with pigtail drain removed yesterday. Has remained afebrile with no leukocytosis. He is currently on Day 17 of antimicrobial therapy with Unasyn. Recommend continued Unasyn while in the hospital and transition to Augmentin with total treatment duration of 10 days ending on 12/31/17.  HIV - Stable with suppressed viral load and no evidence of opportunistic infection. Continue Biktarvy and follow up in ID clinic.  Stage 2 Pressure Ulcer - Mr. Bhat indicates he has been moving around more.  Encouraged good nutrition and continued movement.  Hypokalemia - replaced today by primary team.    ID will sign off and be available as needed during the remainder of MR. Tremaine's hospitalization.   Thank you for allow [place] to provide care for this patient.     Principal Problem:   Sepsis due to pneumonia [facility]) Active Problems:   History of substance abuse   HIV (+) Human immunodeficiency virus disease (HCC)   Tobacco abuse   Hyponatremia   Acute kidney injury (HCC)   Protein-calorie malnutrition, severe   Pressure injury of skin   Tachycardia   . bictegravir-emtricitabine-tenofovir AF  1 tablet Oral Daily  . diltiazem  120 mg Oral Daily  . feeding supplement  1 Container Oral BID BM  . feeding supplement (ENSURE ENLIVE)  237 mL Oral BID BM  . folic acid  1 mg Oral Daily  . furosemide  40 mg Intravenous Q8H  . Gerhardt's butt cream   Topical BID  . guaiFENesin  600 mg Oral BID  . metoprolol  tartrate  25 mg Oral BID  . multivitamin with minerals  1 tablet Oral Daily  . nicotine  14 mg Transdermal Daily  . sodium chloride flush  5 mL Intracatheter Q8H  . thiamine  100 mg Oral Daily  . traZODone  100 mg Oral QHS    SUBJECTIVE:  Afebrile overnight with no leukocytosis. Hypokalemia noted on blood work.  Pigtail drain was discontinued yesterday. Appears disposition likely to be skilled nursing.   Feeling good and has been moving around more. Eating and drinking okay.    No Known Allergies   Review of Systems: Review of Systems  Constitutional: Negative for chills and fever.  Respiratory: Negative for cough, shortness of breath and wheezing.   Cardiovascular: Negative for chest pain and leg swelling.  Gastrointestinal: Negative for abdominal pain, constipation, diarrhea, nausea and vomiting.  Skin: Negative for rash.    OBJECTIVE: Vitals:   12/21/17 2111 12/22/17 0454 12/22/17 0600 12/22/17 0940  BP:  (!) 146/86  (!) 142/87  Pulse: 87 81  83  Resp:      Temp:  97.7 F (36.5 C)    TempSrc:  Oral    SpO2:  100%    Weight:   90 lb 2.7 oz (40.9 kg)   Height:       Body mass index is 12.58 kg/m.  Physical Exam  Constitutional: He is oriented to person, place, and time. He appears well-developed  and well-nourished. He appears cachectic. He is cooperative. No distress.  Cardiovascular: Normal rate, regular rhythm, normal heart sounds and intact distal pulses. Exam reveals no gallop and no friction rub.  No murmur heard. Pulmonary/Chest: Effort normal and breath sounds normal. No stridor. No respiratory distress. He has no wheezes. He has no rales. He exhibits no tenderness.  Neurological: He is alert and oriented to person, place, and time.  Skin: Skin is warm and dry.  Psychiatric: He has a normal mood and affect. His behavior is normal. Judgment and thought content normal.    Lab Results Lab Results  Component Value Date   WBC 6.2 12/22/2017   HGB 10.8 (L)  12/22/2017   HCT 32.9 (L) 12/22/2017   MCV 97.1 12/22/2017   PLT 309 12/22/2017    Lab Results  Component Value Date   CREATININE 0.71 12/22/2017   BUN 6 (L) 12/22/2017   NA 142 12/22/2017   K 2.7 (LL) 12/22/2017   CL 103 12/22/2017   CO2 31 12/22/2017    Lab Results  Component Value Date   ALT 97 (H) 12/22/2017   AST 83 (H) 12/22/2017   ALKPHOS 98 12/22/2017   BILITOT 0.9 12/22/2017     Microbiology: No results found for this or any previous visit (from the past 240 hour(s)).   Marcos Eke, NP [facility] [phone] Pager  12/22/2017  9:58 AM

## 2017-12-22 NOTE — Progress Notes (Signed)
Physical Therapy Treatment Patient Details Name: Kevin Jimenez MRN: [id number] DOB: 10-21-1950 Today's Date: 12/22/2017    History of Present Illness Kevin Jimenez is a 67 y.o. M with a PMH of HIV (elite controller), Chronic Hep C (non compliant with Harvoni), HTN, polysubstance abuse (heroin, tobacco), PNA,  AKI. Pt admitted on 12/07/2017 to ED complaing of weight loss and SOB. Pt diagnosed with sepsis secondary to R sided PNA and Parapneumonica effusion and empyema.    PT Comments    Pt progressing toward goals. Continue to recommend SNF.   Follow Up Recommendations  SNF;Supervision/Assistance - 24 hour     Equipment Recommendations  Rolling walker with 5" wheels;3in1 (PT)    Recommendations for Other Services       Precautions / Restrictions Precautions Precautions: Fall Restrictions Weight Bearing Restrictions: No    Mobility  Bed Mobility Overal bed mobility: Needs Assistance Bed Mobility: Supine to Sit     Supine to sit: Mod assist     General bed mobility comments: Assist to move legs off of bed, elevate trunk into sitting, and bring hips to EOB.  Transfers Overall transfer level: Needs assistance Equipment used: 4-wheeled walker Transfers: Sit to/from [employer] Sit to Stand: +2 physical assistance;Mod assist Stand pivot transfers: Min assist;+2 physical assistance       General transfer comment: Assist to bring hips/trunk up. Pt with posterior lean. Took small pivotal steps with rollator from bed to recliner.   Ambulation/Gait             General Gait Details: Took small pivotal steps with rollator for bed to chair.    Stairs             Wheelchair Mobility    Modified Rankin (Stroke Patients Only)       Balance Overall balance assessment: Needs assistance Sitting-balance support: Feet supported;No upper extremity supported Sitting balance-Leahy Scale: Fair     Standing balance support: Bilateral upper  extremity supported Standing balance-Leahy Scale: Poor Standing balance comment: rollator and +2 min assist for static standing. Verbal/tactile cues to extend hips and trunk.                            Cognition Arousal/Alertness: Awake/alert Behavior During Therapy: WFL for tasks assessed/performed Overall Cognitive Status: Within Functional Limits for tasks assessed                                 General Comments: Pt agreeable to increasing mobilization      Exercises      General Comments General comments (skin integrity, edema, etc.): VSS      Pertinent Vitals/Pain Pain Assessment: Faces Faces Pain Scale: Hurts little more Pain Location: groin  Pain Descriptors / Indicators: Discomfort;Guarding Pain Intervention(s): Limited activity within patient's tolerance;Monitored during session;Repositioned    Home Living                      Prior Function            PT Goals (current goals can now be found in the care plan section) Acute Rehab PT Goals Patient Stated Goal: to get stronger Progress towards PT goals: Progressing toward goals    Frequency    Min 2X/week      PT Plan Current plan remains appropriate;Frequency needs to be updated    Co-evaluation PT/OT/SLP Co-Evaluation/Treatment: Yes Reason  for Co-Treatment: Other (comment)(maximize success for participation) PT goals addressed during session: Mobility/safety with mobility;Balance OT goals addressed during session: ADL's and self-care      AM-PAC PT "6 Clicks" Daily Activity  Outcome Measure  Difficulty turning over in bed (including adjusting bedclothes, sheets and blankets)?: Unable Difficulty moving from lying on back to sitting on the side of the bed? : Unable Difficulty sitting down on and standing up from a chair with arms (e.g., wheelchair, bedside commode, etc,.)?: Unable Help needed moving to and from a bed to chair (including a wheelchair)?: A  Lot Help needed walking in hospital room?: Total Help needed climbing 3-5 steps with a railing? : Total 6 Click Score: 7    End of Session Equipment Utilized During Treatment: Gait belt;Oxygen Activity Tolerance: Patient tolerated treatment well Patient left: in chair;with call bell/phone within reach;with chair alarm set Nurse Communication: Mobility status PT Visit Diagnosis: Unsteadiness on feet (R26.81);Other abnormalities of gait and mobility (R26.89);Muscle weakness (generalized) (M62.81)     Time: 8099-8338 PT Time Calculation (min) (ACUTE ONLY): 19 min  Charges:  $Therapeutic Activity: 8-22 mins                     [facility] PT [phone]    Angelina Ok [facility] 12/22/2017, 2:41 PM

## 2017-12-23 ENCOUNTER — Inpatient Hospital Stay (HOSPITAL_COMMUNITY): Payer: Medicare Other

## 2017-12-23 LAB — MAGNESIUM: MAGNESIUM: 1.3 mg/dL — AB (ref 1.7–2.4)

## 2017-12-23 LAB — COMPREHENSIVE METABOLIC PANEL
ALT: 85 U/L — AB (ref 0–44)
AST: 89 U/L — AB (ref 15–41)
Albumin: 1.6 g/dL — ABNORMAL LOW (ref 3.5–5.0)
Alkaline Phosphatase: 97 U/L (ref 38–126)
Anion gap: 7 (ref 5–15)
CHLORIDE: 95 mmol/L — AB (ref 98–111)
CO2: 37 mmol/L — AB (ref 22–32)
CREATININE: 0.69 mg/dL (ref 0.61–1.24)
Calcium: 7.4 mg/dL — ABNORMAL LOW (ref 8.9–10.3)
GFR calc Af Amer: >60 mL/min (ref >60)
Glucose, Bld: 86 mg/dL (ref 70–99)
POTASSIUM: 2.7 mmol/L — AB (ref 3.5–5.1)
SODIUM: 139 mmol/L (ref 135–145)
Total Bilirubin: 0.8 mg/dL (ref 0.3–1.2)
Total Protein: 6.3 g/dL — ABNORMAL LOW (ref 6.5–8.1)

## 2017-12-23 LAB — CBC
HCT: 32.2 % — ABNORMAL LOW (ref 39.0–52.0)
Hemoglobin: 10.4 g/dL — ABNORMAL LOW (ref 13.0–17.0)
MCH: 31.3 pg (ref 26.0–34.0)
MCHC: 32.3 g/dL (ref 30.0–36.0)
MCV: 97 fL (ref 78.0–100.0)
PLATELETS: 293 10*3/uL (ref 150–400)
RBC: 3.32 MIL/uL — ABNORMAL LOW (ref 4.22–5.81)
RDW: 12.9 % (ref 11.5–15.5)
WBC: 6.3 10*3/uL (ref 4.0–10.5)

## 2017-12-23 MED ORDER — MAGNESIUM SULFATE 2 GM/50ML IV SOLN
2.0000 g | Freq: Once | INTRAVENOUS | Status: AC
  Administered 2017-12-23: 2 g via INTRAVENOUS
  Filled 2017-12-23: qty 50

## 2017-12-23 MED ORDER — POTASSIUM CHLORIDE 10 MEQ/100ML IV SOLN
10.0000 meq | INTRAVENOUS | Status: AC
  Administered 2017-12-23 (×3): 10 meq via INTRAVENOUS
  Filled 2017-12-23 (×3): qty 100

## 2017-12-23 MED ORDER — MAGNESIUM SULFATE 2 GM/50ML IV SOLN
2.0000 g | Freq: Once | INTRAVENOUS | Status: DC
  Administered 2017-12-23: 2 g via INTRAVENOUS
  Filled 2017-12-23: qty 50

## 2017-12-23 MED ORDER — POTASSIUM CHLORIDE CRYS ER 20 MEQ PO TBCR
40.0000 meq | EXTENDED_RELEASE_TABLET | ORAL | Status: AC
  Administered 2017-12-23: 40 meq via ORAL
  Filled 2017-12-23: qty 2

## 2017-12-23 MED ORDER — MAGNESIUM SULFATE 4 GM/100ML IV SOLN: 4.0000 g | Freq: Once | INTRAVENOUS | Status: DC

## 2017-12-23 MED ORDER — POTASSIUM CHLORIDE CRYS ER 20 MEQ PO TBCR
40.0000 meq | EXTENDED_RELEASE_TABLET | Freq: Once | ORAL | Status: DC
  Administered 2017-12-23: 40 meq via ORAL
  Filled 2017-12-23: qty 2

## 2017-12-23 MED ORDER — POTASSIUM CHLORIDE CRYS ER 20 MEQ PO TBCR: 40.0000 meq | EXTENDED_RELEASE_TABLET | ORAL | Status: DC

## 2017-12-23 MED ORDER — FUROSEMIDE 10 MG/ML IJ SOLN
40.0000 mg | Freq: Two times a day (BID) | INTRAMUSCULAR | Status: DC
  Administered 2017-12-23 – 2017-12-27 (×9): 40 mg via INTRAVENOUS
  Filled 2017-12-23 (×9): qty 4

## 2017-12-23 MED ORDER — MAGNESIUM SULFATE 50 % IJ SOLN: 4.0000 g | Freq: Once | INTRAVENOUS | Status: DC

## 2017-12-23 NOTE — Progress Notes (Signed)
RT was called to assess the patient as his o2 saturation had dropped in the high 70's to low 80's. Upon assessment, the patient was on a NRB mask with o2 sats 88%. The patient did have a chest tube placed and removed this admission. The patient does have subcutaneous emphysema. The flow was increased on his NRB mask for an increase in o2 saturation to 95-100%. Will continue to monitor the patient.

## 2017-12-23 NOTE — Plan of Care (Signed)
  Problem: Activity: Goal: Risk for activity intolerance will decrease Outcome: Progressing   Problem: Nutrition: Goal: Adequate nutrition will be maintained Outcome: Progressing   Problem: Coping: Goal: Level of anxiety will decrease Outcome: Completed/Met

## 2017-12-23 NOTE — Progress Notes (Signed)
CSW confirmed SNF bed choice with patient - [facility]. [facility] to start patient's [facility] authorization and attempt carve out with [facility] for [facility], due to the high cost. CSW awaiting determination. Will follow and support.  Abigail Butts, LCSWA [phone]

## 2017-12-23 NOTE — Progress Notes (Addendum)
[facility]                                                                              Patient Demographics  Kevin Jimenez, is a 67 y.o. male, DOB - 1951-02-01, RDE:[id number]  Admit date - 12/07/2017   Admitting Physician Briscoe Deutscher, MD  Outpatient Primary MD for the patient is Fleet Contras, MD  Outpatient specialists:   LOS - 16  days   Medical records reviewed and are as summarized below:    Chief Complaint  Patient presents with  . Fatigue       Brief summary   Patient is a 67 year old male with past medical history of HIV, chronic hepatitis C, noncompliant with Harvoni, hypertension, polysubstance abuse including heroin, tobacco abuse, history of a spontaneous pneumothorax who  is being managed for right-sided pneumonia with parapneumonic effusion and empyema.  He is status post right-sided chest tube placement.  He was admitted on 7/17 when he presented to the emergency department with complaints of weight loss, shortness of breath and cough.  Assessment & Plan   Principal problem Right-sided pneumonia with  lung abscess/empyema -Large right-sided pleural effusion, CT VS was consulted, chest tube was placed -Management per CT surgery, chest tubes removed  -ID following, recommended continue Unasyn while inpatient and then transition to Augmentin for 10 days ending on 12/31/2017   Active problems Dyspnea secondary to acute on chronic systolic CHF exacerbation, bilateral pleural effusion likely due to volume overload -Improving, peripheral edema, penile and scrotal edema improving, no shortness of breath this morning. -Positive balance of 12.1 L  (down from 27L), discontinue IV Lasix -2D echo on 7/20 had shown EF of 30 to 35% with severe hypokinesis at the basal to mid ventricular level Addendum 4:55pm RN notified dyspnea again, sats in 80's, placed on NRB mask - Lasix 40mg  IV x 1, stat CXR. If patient has any air leak or PTX, will call [facility].  Patient had chest tube removed on 7/31 Addendum: 5:04 pm CXR reviewed loculated hydropneumothorax resolved, still has pulmonary edema - will continue lasix 40mg  IV q12hours  Hypokalemia, hypomagnesemia -Replaced IV and oral  Paroxysmal atrial fibrillation with RVR -Currently rate controlled, patient had RVR on 7/27 requiring IV Cardizem and IV metoprolol -Continue oral Cardizem and metoprolol -Not a candidate for chronic anticoagulation due to comorbidities empyema and noncompliance  HIV/AIDS -Noncompliant with medications, continue Biktarvy -ID following  Chronic hepatitis C -Outpatient treatment.  Noncompliant with Harvoni  Failure to thrive -Continue Remeron, nutritional supplements, PT  Transaminitis -Improving, hepatitis C versus side effect of Biktarvy  Acute kidney injury with hyponatremia -Resolved, stable at 0.6  Stage II sacral pressure ulcer -Frequent turning, encourage ambulation, wound care  Generalized debility, hypoalbuminemia -  PT recommending skilled nursing facility  Code Status: DNR DVT Prophylaxis:  SCD's Family Communication: Discussed in detail with the patient, all imaging results, lab results explained to the patient    Disposition Plan:   Time Spent in minutes   25 minutes  Procedures:  Chest tube placement  Consultants:   Infectious disease CT VS  Antimicrobials:   IV Unasyn   Medications  Scheduled Meds: . amoxicillin-clavulanate  1 tablet Oral Q12H  . bictegravir-emtricitabine-tenofovir AF  1 tablet Oral Daily  . diltiazem  120 mg Oral Daily  . feeding supplement  1 Container Oral BID BM  . feeding supplement (ENSURE ENLIVE)  237 mL Oral BID BM  . folic acid  1 mg Oral Daily  . Gerhardt's butt cream   Topical BID  . guaiFENesin  600 mg Oral BID  . metoprolol tartrate  25 mg Oral BID  . multivitamin with minerals  1 tablet Oral Daily  . nicotine  14 mg Transdermal Daily  . potassium chloride  40 mEq Oral Q4H  .  sodium chloride flush  5 mL Intracatheter Q8H  . thiamine  100 mg Oral Daily  . traZODone  100 mg Oral QHS   Continuous Infusions:  PRN Meds:.benzonatate, haloperidol lactate, hydrALAZINE, metoprolol tartrate, ondansetron **OR** ondansetron (ZOFRAN) IV   Antibiotics   Anti-infectives (From admission, onward)   Start     Dose/Rate Route Frequency Ordered Stop   12/22/17 1800  amoxicillin-clavulanate (AUGMENTIN) 875-125 MG per tablet 1 tablet     1 tablet Oral Every 12 hours 12/22/17 1429 01/01/18 2159   12/08/17 1430  Ampicillin-Sulbactam (UNASYN) 3 g in sodium chloride 0.9 % 100 mL IVPB  Status:  Discontinued     3 g 200 mL/hr over 30 Minutes Intravenous Every 8 hours 12/08/17 0915 12/22/17 1429   12/08/17 0630  azithromycin (ZITHROMAX) 500 mg in sodium chloride 0.9 % 250 mL IVPB  Status:  Discontinued     500 mg 250 mL/hr over 60 Minutes Intravenous Every 24 hours 12/07/17 1014 12/07/17 1442   12/08/17 0600  cefTRIAXone (ROCEPHIN) 2 g in sodium chloride 0.9 % 100 mL IVPB  Status:  Discontinued     2 g 200 mL/hr over 30 Minutes Intravenous Every 24 hours 12/07/17 1014 12/07/17 1442   12/08/17 0300  vancomycin (VANCOCIN) 500 mg in sodium chloride 0.9 % 100 mL IVPB  Status:  Discontinued     500 mg 100 mL/hr over 60 Minutes Intravenous Every 12 hours 12/07/17 1455 12/09/17 1423   12/07/17 1800  bictegravir-emtricitabine-tenofovir AF (BIKTARVY) 50-200-25 MG per tablet 1 tablet     1 tablet Oral Daily 12/07/17 1651     12/07/17 1500  vancomycin (VANCOCIN) IVPB 1000 mg/200 mL premix     1,000 mg 200 mL/hr over 60 Minutes Intravenous  Once 12/07/17 1455 12/07/17 1638   12/07/17 1500  piperacillin-tazobactam (ZOSYN) IVPB 3.375 g  Status:  Discontinued     3.375 g 12.5 mL/hr over 240 Minutes Intravenous Every 8 hours 12/07/17 1455 12/08/17 0915   12/07/17 0245  cefTRIAXone (ROCEPHIN) 2 g in sodium chloride 0.9 % 100 mL IVPB  Status:  Discontinued     2 g 200 mL/hr over 30 Minutes  Intravenous Every 24 hours 12/07/17 0236 12/07/17 1007   12/07/17 0245  azithromycin (ZITHROMAX) 500 mg in sodium chloride 0.9 % 250 mL IVPB  Status:  Discontinued     500 mg 250 mL/hr over 60 Minutes Intravenous Every 24 hours 12/07/17 0236 12/07/17 1007        Subjective:   Kevin Jimenez was seen and examined today.  Feeling a lot better today, shortness of breath improving, scrotal and penile edema improving,.  Denies any dizziness, abdominal pain, nausea vomiting or diarrhea.  No acute events overnight.   Objective:   Vitals:   12/22/17 1458 12/22/17 2207 12/23/17 0421 12/23/17 0918  BP: 130/80 136/80 Marland Kitchen)  142/86 140/84  Pulse: 73 76 78 79  Resp:  20 18   Temp: 97.6 F (36.4 C) 98.5 F (36.9 C) 98.8 F (37.1 C)   TempSrc: Oral Oral Oral   SpO2: 99% 97% 100%   Weight:   34 kg (75 lb)   Height:        Intake/Output Summary (Last 24 hours) at 12/23/2017 1238 Last data filed at 12/23/2017 0422 Gross per 24 hour  Intake 460 ml  Output 2275 ml  Net -1815 ml     Wt Readings from Last 3 Encounters:  12/23/17 34 kg (75 lb)  04/04/17 52.2 kg (115 lb)  03/30/17 53.1 kg (117 lb)     Exam    General: Alert and oriented x 3, NAD  Eyes:   HEENT:  Atraumatic, normocephalic  Cardiovascular: S1 S2 auscultated, Regular rate and rhythm.  Trace pedal edema b/l  Respiratory: Clear to auscultation bilaterally, no wheezing, rales or rhonchi  Gastrointestinal: Soft, nontender, nondistended, + bowel sounds  Ext: trace pedal edema bilaterally  Neuro: no neuro deficits  Musculoskeletal: No digital cyanosis, clubbing  Skin: No rashes  Psych: Normal affect and demeanor, alert and oriented x3   GU: Penile and scrotal edema improved    Data Reviewed:  I have personally reviewed following labs and imaging studies  Micro Results No results found for this or any previous visit (from the past 240 hour(s)).  Radiology Reports Dg Chest 2 View  Result Date:  12/20/2017 CLINICAL DATA:  Chest tube, shortness of breath EXAM: CHEST - 2 VIEW COMPARISON:  12/16/2017 FINDINGS: Right basilar chest tube remains in place. Continued loculated right basilar hydropneumothorax. Moderate bilateral pleural effusions. Cardiomegaly. Bilateral airspace disease again noted, right greater than left. IMPRESSION: No real change since prior study. Continued loculated pneumothorax at the right lung base with moderate bilateral pleural effusions and bilateral airspace disease, right greater than left. Electronically Signed   By: Charlett Nose M.D.   On: 12/20/2017 11:18   Dg Chest 2 View  Result Date: 12/07/2017 CLINICAL DATA:  67 year old male with shortness of breath and cough. EXAM: CHEST - 2 VIEW COMPARISON:  Chest radiograph dated 04/04/2017 FINDINGS: Large area of consolidative change at the right lung base abutting the lateral pleural surface. Although this may represent pneumonia a mass is not excluded. Clinical correlation and follow-up to resolution recommended. There is a small right pleural effusion. The left lung is clear. No pneumothorax. The cardiac silhouette is within normal limits. No acute osseous pathology. IMPRESSION: Right lower lobe consolidation may represent pneumonia. Clinical correlation and follow-up to resolution recommended to exclude an underlying mass. Trace right pleural effusion. Electronically Signed   By: Elgie Collard M.D.   On: 12/07/2017 02:25   Ct Head Wo Contrast  Result Date: 12/07/2017 CLINICAL DATA:  67 year old male with shortness of breath and altered mental status. EXAM: CT HEAD WITHOUT CONTRAST TECHNIQUE: Contiguous axial images were obtained from the base of the skull through the vertex without intravenous contrast. COMPARISON:  None. FINDINGS: Brain: The ventricles and sulci appropriate size for patient's age. Mild periventricular and deep white matter chronic microvascular ischemic changes noted. A small focal area of subcortical old  infarct noted in the left frontal convexity. There is no acute intracranial hemorrhage. No mass effect or midline shift. No extra-axial fluid collection. Vascular: No hyperdense vessel or unexpected calcification. Skull: Normal. Negative for fracture or focal lesion. Sinuses/Orbits: Mild diffuse mucoperiosteal thickening of paranasal sinuses. No air-fluid levels. The mastoid air  cells are clear. Other: None IMPRESSION: 1. No acute intracranial pathology. 2. Mild chronic microvascular ischemic changes. Electronically Signed   By: Elgie Collard M.D.   On: 12/07/2017 02:23   Ct Chest W Contrast  Result Date: 12/07/2017 CLINICAL DATA:  Cough and shortness of breath, HIV. Unintentional weight loss. EXAM: CT CHEST WITH CONTRAST TECHNIQUE: Multidetector CT imaging of the chest was performed during intravenous contrast administration. CONTRAST:  OMNIPAQUE IOHEXOL 300 MG/ML  SOLN COMPARISON:  Chest radiographs 12/07/2017 and CT chest 03/30/2017. FINDINGS: Cardiovascular: Atherosclerotic calcification of the arterial vasculature, including coronary arteries. Heart is at the upper limits normal in size to mildly enlarged. No pericardial effusion. Mediastinum/Nodes: Mediastinal lymph nodes measure up to 9 mm in the low right paratracheal station. Hilar lymph nodes measure up to 1.4 cm on the right. No axillary adenopathy. Esophagus is grossly unremarkable. Lungs/Pleura: Right lower lobe consolidation with areas of decreased attenuation, air and air-fluid levels. Highly loculated large right pleural effusion. Centrilobular and paraseptal emphysema. Subpleural ground-glass in the posterior left upper lobe, likely infectious or inflammatory in etiology. Small left pleural effusion, simple in appearance, with minimal compressive atelectasis in the left lower lobe. Upper Abdomen: Visualized portion of the liver is mildly heterogeneous, which may be due to arterial phase imaging. There are 2 areas of hyper attenuation  within the right hepatic lobe, measuring up to 1.6 cm visualized portions of the adrenal glands, kidneys, spleen, pancreas, stomach and bowel are grossly unremarkable. Upper abdominal lymph nodes are not enlarged by CT size criteria. Musculoskeletal: No worrisome lytic or sclerotic lesions. IMPRESSION: 1. Right lower lobe consolidation is likely due to pneumonia. Internal fluid, air and air-fluid levels indicate associated necrosis/abscess formation. An underlying mass cannot be definitively excluded. Follow-up to clearing is recommended. 2. Large loculated right pleural effusion is indicative of an empyema. 3. Right hilar adenopathy, likely reactive. This can also be re-evaluated on follow-up imaging. 4. Small left pleural effusion. 5. Hyperattenuating lesions in the right hepatic lobe are difficult to further characterize but may represent perfusion anomalies or flash fill hemangiomas. If further evaluation is desired, MR abdomen without and with contrast is recommended. 6. Aortic atherosclerosis (ICD10-170.0). Coronary artery calcification. 7.  Emphysema (ICD10-J43.9). Electronically Signed   By: Leanna Battles M.D.   On: 12/07/2017 12:57   US Scrotum  Result Date: 12/20/2017 CLINICAL DATA:  Edema. EXAM: ULTRASOUND OF SCROTUM TECHNIQUE: Complete ultrasound examination of the testicles, epididymis, and other scrotal structures was performed. COMPARISON:  None. FINDINGS: Right testicle Measurements: 3.2 x 1.9 x 1.9 cm. No masses. Multiple microcalcifications throughout the right testicle. Left testicle Measurements: 3.3 x 2.0 x 2.0 cm. No masses. Microlithiasis identified. Right epididymis: Possible calcifications in the epididymis of doubtful significance. Left epididymis: Possible punctate calcifications in the epididymis of doubtful significance. Hydrocele:  None visualized. Varicocele:  None visualized. IMPRESSION: 1. Marked skin thickening in the scrotum/perineum. 2. Testicular microlithiasis. Current  literature suggests that testicular microlithiasis is not a significant independent risk factor for development of testicular carcinoma, and that follow up imaging is not warranted in the absence of other risk factors. Monthly testicular self-examination and annual physical exams are considered appropriate surveillance. If patient has other risk factors for testicular carcinoma, then referral to Urology should be considered. (Reference: DeCastro, et al.: A 5-Year Follow up Study of Asymptomatic Men with Testicular Microlithiasis. J Urol 2008; 179:1420-1423.) 3. No other significant abnormalities. Electronically Signed   By: Gerome Sam III M.D   On: 12/20/2017 17:19  Dg Chest Port 1 View  Result Date: 12/16/2017 CLINICAL DATA:  Follow-up chest tube EXAM: PORTABLE CHEST 1 VIEW COMPARISON:  12/14/2017 FINDINGS: Cardiac shadow is stable. Right-sided chest tube is again seen. Fluid is noted loculated within the minor fissure as well as inferiorly on the right. A small left pleural effusion is again seen and stable. No new focal infiltrate is noted. No bony abnormality is seen. IMPRESSION: No significant interval change from the prior exam. Electronically Signed   By: Alcide Clever M.D.   On: 12/16/2017 07:22   Dg Chest Port 1 View  Result Date: 12/14/2017 CLINICAL DATA:  Dyspnea. EXAM: PORTABLE CHEST 1 VIEW COMPARISON:  12/12/2017. FINDINGS: Stable cardiomediastinal silhouette. Pigtail catheter RIGHT lower chest is unchanged, along with asymmetric RIGHT lung opacity. No pneumothorax. IMPRESSION: Stable chest. Electronically Signed   By: Elsie Stain M.D.   On: 12/14/2017 07:17   Dg Chest Port 1 View  Result Date: 12/12/2017 CLINICAL DATA:  Dyspnea, pleural effusions, right-sided chest tube. EXAM: PORTABLE CHEST 1 VIEW COMPARISON:  Portable chest x-ray of December 11, 2017 FINDINGS: Confluent airspace opacity persists in the right mid and lower lung. There is a moderate amount of pleural fluid on the right  which is stable. The right pigtail pleural drainage catheter is in stable position inferiorly. On the left the lung is better inflated. There is left basilar atelectasis. There is a small left pleural effusion. The heart is top-normal in size. The pulmonary vascularity is normal. There is calcification in the wall of the aortic arch. The bony thorax is unremarkable. IMPRESSION: Persistent right basilar atelectasis and/or pneumonia with moderate-sized right pleural effusion. Stable positioning of the pigtail catheter on the right. No pneumothorax. Stable small left pleural effusion and basilar atelectasis. Thoracic aortic atherosclerosis. Electronically Signed   By: David  [place] M.D.   On: 12/12/2017 07:49   Dg Chest Port 1 View  Result Date: 12/11/2017 CLINICAL DATA:  Shortness of breath. EXAM: PORTABLE CHEST 1 VIEW COMPARISON:  12/10/2017 FINDINGS: Pigtail drainage catheter over right lung base unchanged. No evidence pneumothorax. Lungs are adequately inflated with persistent opacification over the right mid to lower lung unchanged likely right-sided effusion with atelectasis. Possible very small amount of left pleural fluid unchanged. Cardiomediastinal silhouette and remainder the exam is unchanged. IMPRESSION: Stable opacification over the right mid to lower lung likely effusion with atelectasis. Pigtail drainage catheter over the right base unchanged. No pneumothorax. Tiny stable amount left pleural fluid. Electronically Signed   By: Elberta Fortis M.D.   On: 12/11/2017 10:51   Dg Chest Port 1 View  Result Date: 12/10/2017 CLINICAL DATA:  Empyema.  Status post drainage catheter placement. EXAM: PORTABLE CHEST 1 VIEW COMPARISON:  CT scan 12/07/2017 and chest x-ray, same date. FINDINGS: The cardiac silhouette, mediastinal and hilar contours are within normal limits and stable. The left lung remains clear. There is a pigtail catheter noted at the right lung base which was placed into the empyema cavity. The  loculated right basilar fluid collection is smaller. There is surrounding pneumonia and atelectasis which also appears slightly improved. IMPRESSION: Right basilar pleural drainage catheter in good position with decreased loculated right pleural fluid collection/empyema. Persistent right lower lobe pneumonia and atelectasis but it may be slightly improved. The left lung remains clear. Electronically Signed   By: Rudie Meyer M.D.   On: 12/10/2017 08:40   Ct Image Guided Drainage By Percutaneous Catheter  Result Date: 12/10/2017 INDICATION: Sepsis, pneumonia, HIV, right parapneumonic effusion versus empyema EXAM:  CT-GUIDED 14 FRENCH RIGHT CHEST TUBE INSERTION MEDICATIONS: The patient is currently admitted to the hospital and receiving intravenous antibiotics. The antibiotics were administered within an appropriate time frame prior to the initiation of the procedure. ANESTHESIA/SEDATION: 2.0 mg IV Versed 100 mcg IV Fentanyl Moderate Sedation Time:  20 MINUTES The patient was continuously monitored during the procedure by the interventional radiology nurse under my direct supervision. COMPLICATIONS: None immediate. TECHNIQUE: Informed written consent was obtained from the patient after a thorough discussion of the procedural risks, benefits and alternatives. All questions were addressed. Maximal Sterile Barrier Technique was utilized including caps, mask, sterile gowns, sterile gloves, sterile drape, hand hygiene and skin antiseptic. A timeout was performed prior to the initiation of the procedure. PROCEDURE: The right lateral chest was prepped with ChloraPrep in a sterile fashion, and a sterile drape was applied covering the operative field. A sterile gown and sterile gloves were used for the procedure. Local anesthesia was provided with 1% Lidocaine. Previous imaging reviewed. Patient positioned supine. Noncontrast localization CT performed. The large loculated right pleural effusion was localized. A right  lateral approach mid axillary line was marked. Under sterile conditions and local anesthesia, an 18 gauge 10 cm access needle was advanced percutaneously right lateral axillary approach into the pleural effusion. There was return of exudative fluid. Amplatz guidewire inserted followed by tract dilatation to insert a 14 French drain. Drain catheter position confirmed with CT. Chest tube connected to pleura vac. 400 cc removed. Sample sent for culture. Catheter secured with a Prolene suture and a sterile dressing. No immediate complication. Patient tolerated the procedure well. FINDINGS: Imaging confirms needle placed in the right loculated effusion for drain insertion IMPRESSION: Successful CT-guided 14 French right chest tube insertion Electronically Signed   By: Judie Petit.  Shick M.D.   On: 12/10/2017 08:17    Lab Data:  CBC: Recent Labs  Lab 12/19/17 0442 12/20/17 0657 12/21/17 0714 12/22/17 0401 12/23/17 0359  WBC 7.9 7.5 6.3 6.2 6.3  HGB 9.1* 9.9* 9.8* 10.8* 10.4*  HCT 29.2* 32.0* 31.4* 32.9* 32.2*  MCV 103.2* 101.9* 101.3* 97.1 97.0  PLT 258 295 272 309 293   Basic Metabolic Panel: Recent Labs  Lab 12/18/17 0800 12/19/17 0442 12/20/17 0431 12/21/17 0714 12/22/17 0401 12/22/17 0649 12/22/17 1529 12/23/17 0359  NA 139 140 139 143 142  --   --  139  K 3.2* 3.7 4.3 3.8 2.7*  --  3.2* 2.7*  CL 109 110 111 111 103  --   --  95*  CO2 22 25 24 27 31   --   --  37*  GLUCOSE 116* 126* 114* 117* 89  --   --  86  BUN 6* <5* 6* 6* 6*  --   --  <5*  CREATININE 0.64 0.61 0.60* 0.60* 0.71  --   --  0.69  CALCIUM 6.8* 6.8* 7.1* 7.5* 7.6*  --   --  7.4*  MG 1.0* 1.2*  --   --   --  0.9*  --  1.3*   GFR: Estimated Creatinine Clearance: 43.1 mL/min (by C-G formula based on SCr of 0.69 mg/dL). Liver Function Tests: Recent Labs  Lab 12/19/17 0442 12/20/17 0431 12/21/17 0714 12/22/17 0401 12/23/17 0359  AST 97* 86* 76* 83* 89*  ALT 137* 111* 101* 97* 85*  ALKPHOS 81 79 82 98 97  BILITOT 0.5  0.8 0.7 0.9 0.8  PROT 5.6* 4.9* 5.7* 6.6 6.3*  ALBUMIN 1.3* 1.3* 1.4* 1.6* 1.6*   No  results for input(s): LIPASE, AMYLASE in the last 168 hours. No results for input(s): AMMONIA in the last 168 hours. Coagulation Profile: No results for input(s): INR, PROTIME in the last 168 hours. Cardiac Enzymes: Recent Labs  Lab 12/18/17 0800 12/18/17 1504 12/18/17 2052  TROPONINI 0.05* 0.06* 0.05*   BNP (last 3 results) No results for input(s): PROBNP in the last 8760 hours. HbA1C: No results for input(s): HGBA1C in the last 72 hours. CBG: No results for input(s): GLUCAP in the last 168 hours. Lipid Profile: No results for input(s): CHOL, HDL, LDLCALC, TRIG, CHOLHDL, LDLDIRECT in the last 72 hours. Thyroid Function Tests: No results for input(s): TSH, T4TOTAL, FREET4, T3FREE, THYROIDAB in the last 72 hours. Anemia Panel: No results for input(s): VITAMINB12, FOLATE, FERRITIN, TIBC, IRON, RETICCTPCT in the last 72 hours. Urine analysis:    Component Value Date/Time   COLORURINE YELLOW 12/07/2017 0300   APPEARANCEUR HAZY (A) 12/07/2017 0300   LABSPEC 1.010 12/07/2017 0300   PHURINE 6.0 12/07/2017 0300   GLUCOSEU NEGATIVE 12/07/2017 0300   HGBUR SMALL (A) 12/07/2017 0300   BILIRUBINUR NEGATIVE 12/07/2017 0300   KETONESUR NEGATIVE 12/07/2017 0300   PROTEINUR NEGATIVE 12/07/2017 0300   NITRITE NEGATIVE 12/07/2017 0300   LEUKOCYTESUR NEGATIVE 12/07/2017 0300     Ripudeep Rai M.D. [facility] 12/23/2017, 12:38 PM  Pager: [phone] Between 7am to 7pm - call Pager - [phone]  After 7pm go to [email or web address] - password [email or web address]  Call night coverage person covering after 7pm

## 2017-12-23 NOTE — Progress Notes (Signed)
Around 1620 patient was found to be SOB and tachypneic. Was on 2L o2. O2 sats were 70s to mid 80s. Was placed on non rebreather. Crackles in R upper and lower lobe. Notified Dr. Isidoro Donning. Orders for CXR. IV lasix.  Now at 1830 patient was trialed off non rebreather and on 4L o2 at mid to high 90s. Dr. Isidoro Donning aware of cxr results. Will continue to monitor.

## 2017-12-24 LAB — CBC
HEMATOCRIT: 29.5 % — AB (ref 39.0–52.0)
Hemoglobin: 9.4 g/dL — ABNORMAL LOW (ref 13.0–17.0)
MCH: 31.3 pg (ref 26.0–34.0)
MCHC: 31.9 g/dL (ref 30.0–36.0)
MCV: 98.3 fL (ref 78.0–100.0)
Platelets: 268 10*3/uL (ref 150–400)
RBC: 3 MIL/uL — ABNORMAL LOW (ref 4.22–5.81)
RDW: 13.1 % (ref 11.5–15.5)
WBC: 5.8 10*3/uL (ref 4.0–10.5)

## 2017-12-24 LAB — BASIC METABOLIC PANEL
Anion gap: 6 (ref 5–15)
BUN: 6 mg/dL — AB (ref 8–23)
CO2: 35 mmol/L — AB (ref 22–32)
CREATININE: 0.68 mg/dL (ref 0.61–1.24)
Calcium: 7.6 mg/dL — ABNORMAL LOW (ref 8.9–10.3)
Chloride: 96 mmol/L — ABNORMAL LOW (ref 98–111)
GFR calc non Af Amer: >60 mL/min (ref >60)
GLUCOSE: 100 mg/dL — AB (ref 70–99)
Potassium: 3.6 mmol/L (ref 3.5–5.1)
Sodium: 137 mmol/L (ref 135–145)

## 2017-12-24 LAB — MAGNESIUM: Magnesium: 1.5 mg/dL — ABNORMAL LOW (ref 1.7–2.4)

## 2017-12-24 MED ORDER — POTASSIUM CHLORIDE CRYS ER 20 MEQ PO TBCR
40.0000 meq | EXTENDED_RELEASE_TABLET | Freq: Once | ORAL | Status: AC
  Administered 2017-12-24: 40 meq via ORAL
  Filled 2017-12-24: qty 2

## 2017-12-24 MED ORDER — POTASSIUM CHLORIDE CRYS ER 20 MEQ PO TBCR
20.0000 meq | EXTENDED_RELEASE_TABLET | Freq: Two times a day (BID) | ORAL | Status: DC
  Administered 2017-12-24 – 2017-12-27 (×8): 20 meq via ORAL
  Filled 2017-12-24 (×7): qty 1

## 2017-12-24 MED ORDER — MAGNESIUM SULFATE 4 GM/100ML IV SOLN
4.0000 g | Freq: Once | INTRAVENOUS | Status: AC
  Administered 2017-12-24: 4 g via INTRAVENOUS
  Filled 2017-12-24: qty 100

## 2017-12-24 NOTE — Plan of Care (Signed)
  Problem: Clinical Measurements: Goal: Ability to maintain clinical measurements within normal limits will improve Outcome: Progressing Goal: Respiratory complications will improve Outcome: Progressing   Problem: Activity: Goal: Risk for activity intolerance will decrease Outcome: Progressing   

## 2017-12-24 NOTE — Progress Notes (Signed)
PROGRESS NOTE    Daimien Patmon  EOF:[id number] DOB: 1951/05/14 DOA: 12/07/2017 PCP: Fleet Contras, MD   Brief Narrative: 67 year old male with past medical history of HIV, chronic hepatitis C, noncompliant with Harvoni, hypertension, polysubstance abuse including heroin, tobacco abuse, history of a spontaneous pneumothorax who is being managed for right-sided pneumonia with parapneumonic effusion and empyema. He is status post right-sided chest tube placement. He was admitted on 7/17 when he presented to the emergency department with complaints of weight loss, shortness of breath and cough.   Assessment & Plan:   Principal Problem:   Sepsis due to pneumonia [facility]) Active Problems:   History of substance abuse   HIV (+) Human immunodeficiency virus disease (HCC)   Tobacco abuse   Hyponatremia   Acute kidney injury (HCC)   Protein-calorie malnutrition, severe   Pressure injury of skin   Tachycardia  Right-sided pneumonia with  lung abscess/empyema -Large right-sided pleural effusion, CT VS was consulted, chest tube was placed -Management per CT surgery, chest tubes removed  -ID following, recommended continue Unasyn while inpatient and then transition to Augmentin for 10 days ending on 12/31/2017   Active problems Dyspnea secondary to acute on chronic systolic CHF exacerbation, bilateral pleural effusion likely due to volume overload -Improving, peripheral edema, penile and scrotal edema improving, no shortness of breath this morning. -Positive balance of 12.1 L  (down from 27L), discontinue IV Lasix -2D echo on 7/20 had shown EF of 30 to 35% with severe hypokinesis at the basal to mid ventricular level.  Chest x-ray yesterday consistent with pulmonary edema continue Lasix IV 40 mg twice a day.  Hypokalemia, hypomagnesemia -Replaced IV and oral  Paroxysmal atrial fibrillation with RVR -Currently rate controlled, patient had RVR on 7/27 requiring IV Cardizem and IV  metoprolol -Continue oral Cardizem and metoprolol -Not a candidate for chronic anticoagulation due to comorbidities empyema and noncompliance  HIV/AIDS -Noncompliant with medications, continue Biktarvy -ID following  Chronic hepatitis C -Outpatient treatment.  Noncompliant with Harvoni  Failure to thrive -Continue Remeron, nutritional supplements, PT  Transaminitis -Improving, hepatitis C versus side effect of Biktarvy  Acute kidney injury with hyponatremia -Resolved, stable at 0.6  Stage II sacral pressure ulcer -Frequent turning, encourage ambulation, wound care  Generalized debility, hypoalbuminemia -  PT recommending skilled nursing facility      DVT prophylaxis SCD Code Status DO NOT RESUSCITATE Family Communication: No family available patient says that he lives at home with his 100 year old mother. Disposition Plan   Consultants: CT surgery and infectious disease Procedures: Chest tube placement Antimicrobials: Augmentin  Subjective: Resting in bed awake alert in no acute distress denies any specific complaints no nausea vomiting diarrhea chest pain shortness of breath or cough reported.  Objective: Vitals:   12/23/17 1642 12/23/17 2004 12/24/17 0520 12/24/17 0851  BP:  121/79 128/75 113/63  Pulse: 85 81 80 79  Resp: (!) 28 (!) 35 20   Temp:  98.3 F (36.8 C) 98.5 F (36.9 C)   TempSrc:  Oral Oral   SpO2: 100% 100% 100% 100%  Weight:   59.1 kg (130 lb 6.4 oz)   Height:        Intake/Output Summary (Last 24 hours) at 12/24/2017 1048 Last data filed at 12/24/2017 0852 Gross per 24 hour  Intake 725 ml  Output 1350 ml  Net -625 ml   Filed Weights   12/22/17 0600 12/23/17 0421 12/24/17 0520  Weight: 40.9 kg (90 lb 2.7 oz) 34 kg (75 lb) 59.1 kg (130  lb 6.4 oz)    Examination:  General exam: Appears calm and comfortable  Respiratory system: Clear to auscultation. Respiratory effort normal. Cardiovascular system: S1 & S2 heard, RRR. No JVD,  murmurs, rubs, gallops or clicks. No pedal edema. Gastrointestinal system: Abdomen is nondistended, soft and nontender. No organomegaly or masses felt. Normal bowel sounds heard. Central nervous system: Alert and oriented. No focal neurological deficits. Extremities: 1+ bilateral pitting edema Skin: No rashes, lesions or ulcers Psychiatry: Judgement and insight appear normal. Mood & affect appropriate.     Data Reviewed: I have personally reviewed following labs and imaging studies  CBC: Recent Labs  Lab 12/20/17 0657 12/21/17 0714 12/22/17 0401 12/23/17 0359 12/24/17 0514  WBC 7.5 6.3 6.2 6.3 5.8  HGB 9.9* 9.8* 10.8* 10.4* 9.4*  HCT 32.0* 31.4* 32.9* 32.2* 29.5*  MCV 101.9* 101.3* 97.1 97.0 98.3  PLT 295 272 309 293 268   Basic Metabolic Panel: Recent Labs  Lab 12/18/17 0800 12/19/17 0442 12/20/17 0431 12/21/17 0714 12/22/17 0401 12/22/17 0649 12/22/17 1529 12/23/17 0359 12/24/17 0514  NA 139 140 139 143 142  --   --  139 137  K 3.2* 3.7 4.3 3.8 2.7*  --  3.2* 2.7* 3.6  CL 109 110 111 111 103  --   --  95* 96*  CO2 22 25 24 27 31   --   --  37* 35*  GLUCOSE 116* 126* 114* 117* 89  --   --  86 100*  BUN 6* <5* 6* 6* 6*  --   --  <5* 6*  CREATININE 0.64 0.61 0.60* 0.60* 0.71  --   --  0.69 0.68  CALCIUM 6.8* 6.8* 7.1* 7.5* 7.6*  --   --  7.4* 7.6*  MG 1.0* 1.2*  --   --   --  0.9*  --  1.3* 1.5*   GFR: Estimated Creatinine Clearance: 74.9 mL/min (by C-G formula based on SCr of 0.68 mg/dL). Liver Function Tests: Recent Labs  Lab 12/19/17 0442 12/20/17 0431 12/21/17 0714 12/22/17 0401 12/23/17 0359  AST 97* 86* 76* 83* 89*  ALT 137* 111* 101* 97* 85*  ALKPHOS 81 79 82 98 97  BILITOT 0.5 0.8 0.7 0.9 0.8  PROT 5.6* 4.9* 5.7* 6.6 6.3*  ALBUMIN 1.3* 1.3* 1.4* 1.6* 1.6*   No results for input(s): LIPASE, AMYLASE in the last 168 hours. No results for input(s): AMMONIA in the last 168 hours. Coagulation Profile: No results for input(s): INR, PROTIME in the last  168 hours. Cardiac Enzymes: Recent Labs  Lab 12/18/17 0800 12/18/17 1504 12/18/17 2052  TROPONINI 0.05* 0.06* 0.05*   BNP (last 3 results) No results for input(s): PROBNP in the last 8760 hours. HbA1C: No results for input(s): HGBA1C in the last 72 hours. CBG: No results for input(s): GLUCAP in the last 168 hours. Lipid Profile: No results for input(s): CHOL, HDL, LDLCALC, TRIG, CHOLHDL, LDLDIRECT in the last 72 hours. Thyroid Function Tests: No results for input(s): TSH, T4TOTAL, FREET4, T3FREE, THYROIDAB in the last 72 hours. Anemia Panel: No results for input(s): VITAMINB12, FOLATE, FERRITIN, TIBC, IRON, RETICCTPCT in the last 72 hours. Sepsis Labs: No results for input(s): PROCALCITON, LATICACIDVEN in the last 168 hours.  No results found for this or any previous visit (from the past 240 hour(s)).       Radiology Studies: Dg Chest Port 1 View  Result Date: 12/23/2017 CLINICAL DATA:  Shortness of breath. EXAM: PORTABLE CHEST 1 VIEW COMPARISON:  12/20/2017. FINDINGS: The previously  demonstrated loculated right basilar hydropneumothorax is not visual today. There is increased oval density at the right lung base, including airspace opacity and pleural fluid. Decreased pleural fluid and airspace opacity at the left lung base. No gross change in size of the heart. The pulmonary vasculature and interstitial markings are mildly prominent. Diffuse osteopenia. Thoracic spine degenerative changes. IMPRESSION: 1. Resolved loculated right basilar hydropneumothorax. 2. Mildly increased right basilar pleural fluid and atelectasis or pneumonia. 3. Decreased left basilar pleural fluid and atelectasis or pneumonia. 4. Mild changes of congestive heart failure with pulmonary vascular congestion and mild interstitial pulmonary edema superimposed on chronic interstitial lung disease. Electronically Signed   By: Beckie Salts M.D.   On: 12/23/2017 16:57        Scheduled Meds: .  amoxicillin-clavulanate  1 tablet Oral Q12H  . bictegravir-emtricitabine-tenofovir AF  1 tablet Oral Daily  . diltiazem  120 mg Oral Daily  . feeding supplement  1 Container Oral BID BM  . feeding supplement (ENSURE ENLIVE)  237 mL Oral BID BM  . folic acid  1 mg Oral Daily  . furosemide  40 mg Intravenous BID  . Gerhardt's butt cream   Topical BID  . guaiFENesin  600 mg Oral BID  . metoprolol tartrate  25 mg Oral BID  . multivitamin with minerals  1 tablet Oral Daily  . nicotine  14 mg Transdermal Daily  . sodium chloride flush  5 mL Intracatheter Q8H  . thiamine  100 mg Oral Daily  . traZODone  100 mg Oral QHS   Continuous Infusions:   LOS: 17 days     Alwyn Ren, MD [facility] If 7PM-7AM, please contact night-coverage [email or web address] Password [facility] 12/24/2017, 10:48 AM

## 2017-12-25 ENCOUNTER — Inpatient Hospital Stay (HOSPITAL_COMMUNITY): Payer: Medicare Other

## 2017-12-25 LAB — CBC
HCT: 30 % — ABNORMAL LOW (ref 39.0–52.0)
HEMOGLOBIN: 9.5 g/dL — AB (ref 13.0–17.0)
MCH: 31.5 pg (ref 26.0–34.0)
MCHC: 31.7 g/dL (ref 30.0–36.0)
MCV: 99.3 fL (ref 78.0–100.0)
Platelets: 243 10*3/uL (ref 150–400)
RBC: 3.02 MIL/uL — ABNORMAL LOW (ref 4.22–5.81)
RDW: 12.8 % (ref 11.5–15.5)
WBC: 5.2 10*3/uL (ref 4.0–10.5)

## 2017-12-25 LAB — BASIC METABOLIC PANEL
Anion gap: 5 (ref 5–15)
CALCIUM: 7.8 mg/dL — AB (ref 8.9–10.3)
CO2: 38 mmol/L — ABNORMAL HIGH (ref 22–32)
CREATININE: 0.65 mg/dL (ref 0.61–1.24)
Chloride: 95 mmol/L — ABNORMAL LOW (ref 98–111)
GFR calc non Af Amer: >60 mL/min (ref >60)
Glucose, Bld: 89 mg/dL (ref 70–99)
Potassium: 3.9 mmol/L (ref 3.5–5.1)
SODIUM: 138 mmol/L (ref 135–145)

## 2017-12-25 LAB — MAGNESIUM: MAGNESIUM: 1.4 mg/dL — AB (ref 1.7–2.4)

## 2017-12-25 MED ORDER — ACETAMINOPHEN 325 MG PO TABS
650.0000 mg | ORAL_TABLET | Freq: Four times a day (QID) | ORAL | Status: DC | PRN
  Administered 2017-12-25: 650 mg via ORAL
  Filled 2017-12-25: qty 2

## 2017-12-25 MED ORDER — MAGNESIUM SULFATE 4 GM/100ML IV SOLN
4.0000 g | Freq: Once | INTRAVENOUS | Status: AC
  Administered 2017-12-25: 4 g via INTRAVENOUS
  Filled 2017-12-25: qty 100

## 2017-12-25 MED ORDER — MAGNESIUM OXIDE 400 (241.3 MG) MG PO TABS
400.0000 mg | ORAL_TABLET | Freq: Two times a day (BID) | ORAL | Status: DC
  Administered 2017-12-25 – 2017-12-27 (×6): 400 mg via ORAL
  Filled 2017-12-25 (×6): qty 1

## 2017-12-25 NOTE — Progress Notes (Signed)
Patient's temperature per nurse tech charting at 2012 was 101.9 degrees Farenheit orally.  RN rechecked oral temperature at about 2216-2217 and temperature was 101 or 100.6 degrees Farenheit depending on thermometer used.  RN text paged [facility] with this information.

## 2017-12-25 NOTE — Progress Notes (Signed)
PROGRESS NOTE    Kevin Jimenez  KZS:[id number] DOB: [birth year] DOA: 12/07/2017 PCP: Fleet Contras, MD  Brief Narrative:  67 year old male with past medical history of HIV, chronic hepatitis C, noncompliant with Harvoni, hypertension, polysubstance abuse including heroin, tobacco abuse, history of a spontaneous pneumothorax who is being managed for right-sided pneumonia with parapneumonic effusion and empyema. He is status post right-sided chest tube placement. He was admitted on 7/17 when he presented to the emergency department with complaints of weight loss, shortness of breath and cough.    Assessment & Plan:   Principal Problem:   Sepsis due to pneumonia [facility]) Active Problems:   History of substance abuse   HIV (+) Human immunodeficiency virus disease (HCC)   Tobacco abuse   Hyponatremia   Acute kidney injury (HCC)   Protein-calorie malnutrition, severe   Pressure injury of skin   Tachycardia Right-sided pneumonia with lung abscess/empyema -Large right-sided pleural effusion, CT VS was consulted, chest tube was placed -Management per CT surgery, chest tubes removed  -ID following, recommended continue Unasyn while inpatient and then transition to Augmentin for 10 days ending on 12/31/2017 -Chest x-ray today   Active problems Dyspnea secondary to acute on chronic systolic CHF exacerbation, bilateral pleural effusion likely due to volume overload -Improving, peripheral edema, penile and scrotal edema improving, no shortness of breath this morning. -Positive balance of 12.1 L(down from 27L),discontinue IV Lasix -2D echo on 7/20had shown EF of 30 to 35% with severe hypokinesis at the basal to mid ventricular level.  Chest x-ray yesterday consistent with pulmonary edema continue Lasix IV 40 mg twice a day.  Hypokalemia, hypomagnesemia -Replaced IV and oral Paroxysmal atrial fibrillation with RVR -Currently rate controlled, patient had RVR on 7/27 requiring IV  Cardizem and IV metoprolol -Continue oral Cardizem and metoprolol -Not a candidate for chronic anticoagulation due to comorbidities empyema and noncompliance  HIV/AIDS -Noncompliant with medications, continue Biktarvy -ID following  Chronic hepatitis C -Outpatient treatment. Noncompliant with Harvoni  Failure to thrive -Continue Remeron, nutritional supplements, PT  Transaminitis -Improving, hepatitis C versus side effect of Biktarvy  Acute kidney injury with hyponatremia -Resolved, stable at 0.6   Stage II sacral pressure ulcer -Frequent turning, encourage ambulation, wound care  Generalized debility, hypoalbuminemia -PT recommending skilled nursing facility         DVT prophylaxis: Lovenox Code Status: DO NOT RESUSCITATE Family Communication: No family available Disposition Plan: Plan discharge to [facility] 12/26/2017  Consultants: CT surgery  Procedures: None Antimicrobials: Augmentin  Subjective: Patient resting in bed in no acute distress answers all questions appropriately denies any nausea vomiting shortness of breath or cough.   Objective: Vitals:   12/24/17 1700 12/24/17 2038 12/25/17 0500 12/25/17 1100  BP:  131/73 126/76 124/71  Pulse:  86 77 78  Resp:  (!) 36 (!) 39 (!) 35  Temp:  99.1 F (37.3 C) 98.1 F (36.7 C)   TempSrc:  Oral Axillary   SpO2: 100% 100% 100% 100%  Weight:   56.7 kg (125 lb)   Height:        Intake/Output Summary (Last 24 hours) at 12/25/2017 1138 Last data filed at 12/25/2017 1100 Gross per 24 hour  Intake 725 ml  Output 1725 ml  Net -1000 ml   Filed Weights   12/23/17 0421 12/24/17 0520 12/25/17 0500  Weight: 34 kg (75 lb) 59.1 kg (130 lb 6.4 oz) 56.7 kg (125 lb)    Examination:  General exam: Appears calm and comfortable  Respiratory system:DIMINISHED  Breath  sounds presents the bases auscultation. Respiratory effort normal. Cardiovascular system: S1 & S2 heard, RRR. No JVD, murmurs, rubs, gallops or  clicks. No pedal edema. Gastrointestinal system: Abdomen is nondistended, soft and nontender. No organomegaly or masses felt. Normal bowel sounds heard. Central nervous system: Alert and oriented. No focal neurological deficits. Extremities: 1+ edema bilaterally lower extremity Skin: No rashes, lesions or ulcers Psychiatry: Judgement and insight appear normal. Mood & affect appropriate.     Data Reviewed: I have personally reviewed following labs and imaging studies  CBC: Recent Labs  Lab 12/21/17 0714 12/22/17 0401 12/23/17 0359 12/24/17 0514 12/25/17 0407  WBC 6.3 6.2 6.3 5.8 5.2  HGB 9.8* 10.8* 10.4* 9.4* 9.5*  HCT 31.4* 32.9* 32.2* 29.5* 30.0*  MCV 101.3* 97.1 97.0 98.3 99.3  PLT 272 309 293 268 243   Basic Metabolic Panel: Recent Labs  Lab 12/19/17 0442  12/21/17 0714 12/22/17 0401 12/22/17 0649 12/22/17 1529 12/23/17 0359 12/24/17 0514 12/25/17 0407  NA 140   < > 143 142  --   --  139 137 138  K 3.7   < > 3.8 2.7*  --  3.2* 2.7* 3.6 3.9  CL 110   < > 111 103  --   --  95* 96* 95*  CO2 25   < > 27 31  --   --  37* 35* 38*  GLUCOSE 126*   < > 117* 89  --   --  86 100* 89  BUN <5*   < > 6* 6*  --   --  <5* 6* <5*  CREATININE 0.61   < > 0.60* 0.71  --   --  0.69 0.68 0.65  CALCIUM 6.8*   < > 7.5* 7.6*  --   --  7.4* 7.6* 7.8*  MG 1.2*  --   --   --  0.9*  --  1.3* 1.5* 1.4*   < > = values in this interval not displayed.   GFR: Estimated Creatinine Clearance: 71.9 mL/min (by C-G formula based on SCr of 0.65 mg/dL). Liver Function Tests: Recent Labs  Lab 12/19/17 0442 12/20/17 0431 12/21/17 0714 12/22/17 0401 12/23/17 0359  AST 97* 86* 76* 83* 89*  ALT 137* 111* 101* 97* 85*  ALKPHOS 81 79 82 98 97  BILITOT 0.5 0.8 0.7 0.9 0.8  PROT 5.6* 4.9* 5.7* 6.6 6.3*  ALBUMIN 1.3* 1.3* 1.4* 1.6* 1.6*   No results for input(s): LIPASE, AMYLASE in the last 168 hours. No results for input(s): AMMONIA in the last 168 hours. Coagulation Profile: No results for  input(s): INR, PROTIME in the last 168 hours. Cardiac Enzymes: Recent Labs  Lab 12/18/17 1504 12/18/17 2052  TROPONINI 0.06* 0.05*   BNP (last 3 results) No results for input(s): PROBNP in the last 8760 hours. HbA1C: No results for input(s): HGBA1C in the last 72 hours. CBG: No results for input(s): GLUCAP in the last 168 hours. Lipid Profile: No results for input(s): CHOL, HDL, LDLCALC, TRIG, CHOLHDL, LDLDIRECT in the last 72 hours. Thyroid Function Tests: No results for input(s): TSH, T4TOTAL, FREET4, T3FREE, THYROIDAB in the last 72 hours. Anemia Panel: No results for input(s): VITAMINB12, FOLATE, FERRITIN, TIBC, IRON, RETICCTPCT in the last 72 hours. Sepsis Labs: No results for input(s): PROCALCITON, LATICACIDVEN in the last 168 hours.  No results found for this or any previous visit (from the past 240 hour(s)).       Radiology Studies: Dg Chest Port 1 View  Result Date: 12/23/2017  CLINICAL DATA:  Shortness of breath. EXAM: PORTABLE CHEST 1 VIEW COMPARISON:  12/20/2017. FINDINGS: The previously demonstrated loculated right basilar hydropneumothorax is not visual today. There is increased oval density at the right lung base, including airspace opacity and pleural fluid. Decreased pleural fluid and airspace opacity at the left lung base. No gross change in size of the heart. The pulmonary vasculature and interstitial markings are mildly prominent. Diffuse osteopenia. Thoracic spine degenerative changes. IMPRESSION: 1. Resolved loculated right basilar hydropneumothorax. 2. Mildly increased right basilar pleural fluid and atelectasis or pneumonia. 3. Decreased left basilar pleural fluid and atelectasis or pneumonia. 4. Mild changes of congestive heart failure with pulmonary vascular congestion and mild interstitial pulmonary edema superimposed on chronic interstitial lung disease. Electronically Signed   By: Beckie Salts M.D.   On: 12/23/2017 16:57        Scheduled Meds: .  amoxicillin-clavulanate  1 tablet Oral Q12H  . bictegravir-emtricitabine-tenofovir AF  1 tablet Oral Daily  . diltiazem  120 mg Oral Daily  . feeding supplement  1 Container Oral BID BM  . feeding supplement (ENSURE ENLIVE)  237 mL Oral BID BM  . folic acid  1 mg Oral Daily  . furosemide  40 mg Intravenous BID  . Gerhardt's butt cream   Topical BID  . guaiFENesin  600 mg Oral BID  . metoprolol tartrate  25 mg Oral BID  . multivitamin with minerals  1 tablet Oral Daily  . nicotine  14 mg Transdermal Daily  . potassium chloride  20 mEq Oral BID  . sodium chloride flush  5 mL Intracatheter Q8H  . thiamine  100 mg Oral Daily  . traZODone  100 mg Oral QHS   Continuous Infusions:   LOS: 18 days     Alwyn Ren, MD [facility]  If 7PM-7AM, please contact night-coverage [email or web address] Password [email or web address] 12/25/2017, 11:38 AM

## 2017-12-26 LAB — CBC
HCT: 28.6 % — ABNORMAL LOW (ref 39.0–52.0)
HEMOGLOBIN: 9.2 g/dL — AB (ref 13.0–17.0)
MCH: 31.2 pg (ref 26.0–34.0)
MCHC: 32.2 g/dL (ref 30.0–36.0)
MCV: 96.9 fL (ref 78.0–100.0)
PLATELETS: 228 10*3/uL (ref 150–400)
RBC: 2.95 MIL/uL — AB (ref 4.22–5.81)
RDW: 12.8 % (ref 11.5–15.5)
WBC: 8 10*3/uL (ref 4.0–10.5)

## 2017-12-26 LAB — BASIC METABOLIC PANEL
Anion gap: 8 (ref 5–15)
BUN: 5 mg/dL — AB (ref 8–23)
CO2: 35 mmol/L — ABNORMAL HIGH (ref 22–32)
CREATININE: 0.75 mg/dL (ref 0.61–1.24)
Calcium: 7.7 mg/dL — ABNORMAL LOW (ref 8.9–10.3)
Chloride: 90 mmol/L — ABNORMAL LOW (ref 98–111)
Glucose, Bld: 137 mg/dL — ABNORMAL HIGH (ref 70–99)
POTASSIUM: 3.3 mmol/L — AB (ref 3.5–5.1)
SODIUM: 133 mmol/L — AB (ref 135–145)

## 2017-12-26 MED ORDER — AMOXICILLIN-POT CLAVULANATE 875-125 MG PO TABS: 1.0000 | ORAL_TABLET | Freq: Two times a day (BID) | ORAL | 0 refills | Status: AC

## 2017-12-26 MED ORDER — DILTIAZEM HCL ER COATED BEADS 120 MG PO CP24: 120.0000 mg | ORAL_CAPSULE | Freq: Every day | ORAL | 0 refills | Status: AC

## 2017-12-26 MED ORDER — GERHARDT'S BUTT CREAM: 1.0000 "application " | TOPICAL_CREAM | Freq: Two times a day (BID) | CUTANEOUS | Status: AC

## 2017-12-26 MED ORDER — FOLIC ACID 1 MG PO TABS: 1.0000 mg | ORAL_TABLET | Freq: Every day | ORAL | Status: AC

## 2017-12-26 MED ORDER — POTASSIUM CHLORIDE CRYS ER 20 MEQ PO TBCR
20.0000 meq | EXTENDED_RELEASE_TABLET | Freq: Two times a day (BID) | ORAL | 0 refills | Status: AC
Start: 2017-12-26 — End: ?

## 2017-12-26 MED ORDER — NICOTINE 14 MG/24HR TD PT24: 14.0000 mg | MEDICATED_PATCH | Freq: Every day | TRANSDERMAL | 0 refills | Status: AC

## 2017-12-26 MED ORDER — MAGNESIUM OXIDE 400 (241.3 MG) MG PO TABS: 400.0000 mg | ORAL_TABLET | Freq: Two times a day (BID) | ORAL | Status: AC

## 2017-12-26 MED ORDER — THIAMINE HCL 100 MG PO TABS: 100.0000 mg | ORAL_TABLET | Freq: Every day | ORAL | Status: AC

## 2017-12-26 MED ORDER — ADULT MULTIVITAMIN W/MINERALS CH: 1.0000 | ORAL_TABLET | Freq: Every day | ORAL | Status: AC

## 2017-12-26 MED ORDER — FUROSEMIDE 20 MG PO TABS: 20.0000 mg | ORAL_TABLET | Freq: Every day | ORAL | 11 refills | Status: AC

## 2017-12-26 MED ORDER — BICTEGRAVIR-EMTRICITAB-TENOFOV 50-200-25 MG PO TABS: 1.0000 | ORAL_TABLET | Freq: Every day | ORAL | 1 refills | Status: AC

## 2017-12-26 MED ORDER — METOPROLOL TARTRATE 25 MG PO TABS: 25.0000 mg | ORAL_TABLET | Freq: Two times a day (BID) | ORAL | 0 refills | Status: AC

## 2017-12-26 NOTE — Progress Notes (Signed)
Nutrition Follow-up  DOCUMENTATION CODES:   Severe malnutrition in context of chronic illness, Underweight  INTERVENTION:   - Continue Ensure Enlive po BID, each supplement provides 350 kcal and 20 grams of protein  - Continue Boost Breeze po BID, each supplement provides 250 kcal and 9 grams of protein  - Continue Magic cup TID with meals, each supplement provides 290 kcal and 9 grams of protein  - Continue MVI with mienrals daily  NUTRITION DIAGNOSIS:   Severe Malnutrition related to chronic illness (HIV) as evidenced by severe fat depletion, severe muscle depletion.  Ongoing, being addressed via oral nutrition supplements  GOAL:   Patient will meet greater than or equal to 90% of their needs  Progressing  MONITOR:   PO intake, Supplement acceptance  REASON FOR ASSESSMENT:   Consult Poor PO  ASSESSMENT:   67 yo male with PMH of substance abuse, tobacco dependence, HTN, HIV (not on antivirals for several months), COPD, and chronic hepatitis C who was admitted on 7/17 with PNA, small left pleural effusion, large loculated right pleural effusion.  7/31 - s/p R chest tube removal  Spoke with pt at bedside. Pt states that he has been eating "better." Per discussion with NT, pt has been eating well and averaging between 75-100% meal completion. Also spoke with RN who reports pt is eating well and taking supplements.   Noted pt with untouched lunch meal tray at bedside. Pt stated he will eat from the tray, but upon RD return, untouched tray being removed from pt's room by Nutrition Services. Spoke again with pt who reports "I'm just not going to eat." Discussed importance of adequate PO intake with pt who reports he will eat at dinner.  Meal Completion: 0-75%  Medications reviewed and include: Boost Breeze BID, Ensure Enlive BID, 1 mg folic acid daily, 40 mg Lasix BID, 400 mg magnesium oxide BID, MVI with minerals daily, 20 mEq K-dur BID, 100 mg thiamine daily  Labs  reviewed: sodium 133 (L), potassium 3.3 (L) - replaced, chloride 90 (L), CO2 35 (H), BUN 5 (L), calcium 7.7 (L), magnesium 1.4 (L) - replaced, hemoglobin 9.2 (L), HCT 28.6 (L)  UOP: 3225 ml x 24 hours I/O's: +6.3 L since 7/22  Diet Order:   Diet Order           Diet regular Room service appropriate? Yes; Fluid consistency: Thin  Diet effective now          EDUCATION NEEDS:   No education needs have been identified at this time  Skin:  Skin Assessment: Skin Integrity Issues: Stage II to coccyx  Last BM:  12/26/17 - medium type 2  Height:   Ht Readings from Last 1 Encounters:  12/07/17 5\' 11"  (1.803 m)    Weight:   Wt Readings from Last 1 Encounters:  12/26/17 120 lb 14.4 oz (54.8 kg)    Ideal Body Weight:  78.2 kg  BMI:  Body mass index is 16.86 kg/m.  Estimated Nutritional Needs:   Kcal:  1900-2100 kcal  Protein:  100-115 gm  Fluid:  1.9-2.1 L    02/25/18, MS, RD, LDN Pager: [phone] Weekend/After Hours: [phone]

## 2017-12-26 NOTE — Care Management Important Message (Signed)
Important Message  Patient Details  Name: Kevin Jimenez MRN: [id number] Date of Birth: 1950/06/12   Medicare Important Message Given:  Yes    Kevin Jimenez Kevin Jimenez 12/26/2017, 3:52 PM

## 2017-12-26 NOTE — Plan of Care (Signed)
RN asked patient if she could assess buttocks/sacrum/coccyx area and apply ordered cream.  Patient refused stating it was already done earlier.  RN explained to patient it was ordered twice a day and patient stated it had been done twice already.  RN and nurse tech going to pull patient up in bed using bed pads prior to patient taking medications and bed pad noted to be wet with urine.  RN explained to patient the bed pad was wet and needed to be changed, patient stated "do what you need to do".   Patient able to turn side to side in bed per self while staff changed linen.  During this care sacral foam dressing removed, peri care completed, cream applied per order and new foam dressing placed.  RN then asked patient if a pillow could be placed under one of his hips to help decrease pressure to sacrum/buttocks/coccyx area.  Patient replied no he had done it all day yesterday. RN explained that patient had a pressure injury on his backside and it would help prevent further pressure sores and could assist with healing current one.  RN also explained to patient that a new pressure injury could develop in about two hours that's why it was important to continue frequent repositioning.  Patient continued to refuse repositioning.

## 2017-12-26 NOTE — Discharge Summary (Addendum)
Physician Discharge Summary  Kevin Jimenez GEZ:[id number] DOB: 09-30-1950 DOA: 12/07/2017  PCP: Fleet Contras, MD  Admit date: 12/07/2017 Discharge date: 12/27/2017 Admitted From: Home Disposition: Nursing home Recommendations for Outpatient Follow-up:  1. Follow up with PCP in 1-2 weeks 2. Please obtain BMP/CBC in one week  Home Health: None Equipment/Devices none  Discharge Condition stable CODE STATUS: DO NOT RESUSCITATE Diet recommendation cardiac Brief/Interim Summary:67 year old male with past medical history of HIV, chronic hepatitis C, noncompliant with Harvoni, hypertension, polysubstance abuse including heroin, tobacco abuse, history of a spontaneous pneumothorax who is being managed for right-sided pneumonia with parapneumonic effusion and empyema. He is status post right-sided chest tube placement. He was admitted on 7/17 when he presented to the emergency department with complaints of weight loss, shortness of breath and cough.    Discharge Diagnoses:  Principal Problem:   Sepsis due to pneumonia [facility]) Active Problems:   History of substance abuse   HIV (+) Human immunodeficiency virus disease (HCC)   Tobacco abuse   Hyponatremia   Acute kidney injury (HCC)   Protein-calorie malnutrition, severe   Pressure injury of skin   Tachycardia Right-sided pneumonia with lung abscess/empyema -Large right-sided pleural effusion, CT VS was consulted, chest tube was placed and removed.  Patient was treated with Unasyn.  He is being discharged on Augmentin to finish a course of 10 days. Active problems Dyspnea secondary to acute on chronic systolic CHF exacerbation, bilateral pleural effusion likely due to volume overload -Improving, peripheral edema, penile and scrotal edema improving, no shortness of breath this morning.  Will DC him on Lasix 20 mg daily.  Hypokalemia, hypomagnesemia -Replaced IV and oral Paroxysmal atrial fibrillation with RVR -Currently rate  controlled, patient had RVR on 7/27 requiring IV Cardizem and IV metoprolol -Continue oral Cardizem and metoprolol -Not a candidate for chronic anticoagulation due to comorbidities empyema and noncompliance HIV/AIDS -Noncompliant with medications, continue Biktarvy -ID was consulted.  Chronic hepatitis C -Outpatient treatment. Noncompliant with Harvoni  Failure to thrive -Continue Remeron, nutritional supplements, PT  Transaminitis -Improving, hepatitis C versus side effect of Biktarvy  Acute kidney injury with hyponatremia -Resolved, stable at 0.6   Stage II sacral pressure ulcer -Frequent turning, encourage ambulation, wound care       Discharge Instructions  Discharge Instructions    Call MD for:  difficulty breathing, headache or visual disturbances   Complete by:  As directed    Call MD for:  persistant dizziness or light-headedness   Complete by:  As directed    Call MD for:  persistant nausea and vomiting   Complete by:  As directed    Call MD for:  redness, tenderness, or signs of infection (pain, swelling, redness, odor or green/yellow discharge around incision site)   Complete by:  As directed    Call MD for:  severe uncontrolled pain   Complete by:  As directed    Call MD for:  temperature >100.4   Complete by:  As directed    Diet - low sodium heart healthy   Complete by:  As directed    Increase activity slowly   Complete by:  As directed      Allergies as of 12/26/2017   No Known Allergies     Medication List    STOP taking these medications   emtricitabine-tenofovir AF 200-25 MG tablet Commonly known as:  DESCOVY   gabapentin 100 MG capsule Commonly known as:  NEURONTIN   Ledipasvir-Sofosbuvir 90-400 MG Tabs Commonly known as:  HARVONI  lisinopril-hydrochlorothiazide 10-12.5 MG tablet Commonly known as:  PRINZIDE,ZESTORETIC   meloxicam 15 MG tablet Commonly known as:  MOBIC     TAKE these medications    amoxicillin-clavulanate 875-125 MG tablet Commonly known as:  AUGMENTIN Take 1 tablet by mouth every 12 (twelve) hours.   bictegravir-emtricitabine-tenofovir AF 50-200-25 MG Tabs tablet Commonly known as:  BIKTARVY Take 1 tablet by mouth daily. Start taking on:  12/27/2017   diltiazem 120 MG 24 hr capsule Commonly known as:  CARDIZEM CD Take 1 capsule (120 mg total) by mouth daily. Start taking on:  12/27/2017   folic acid 1 MG tablet Commonly known as:  FOLVITE Take 1 tablet (1 mg total) by mouth daily. Start taking on:  12/27/2017   Gerhardt's butt cream Crea Apply 1 application topically 2 (two) times daily.   magnesium oxide 400 (241.3 Mg) MG tablet Commonly known as:  MAG-OX Take 1 tablet (400 mg total) by mouth 2 (two) times daily.   metoprolol tartrate 25 MG tablet Commonly known as:  LOPRESSOR Take 1 tablet (25 mg total) by mouth 2 (two) times daily.   multivitamin with minerals Tabs tablet Take 1 tablet by mouth daily.   nicotine 14 mg/24hr patch Commonly known as:  NICODERM CQ - dosed in mg/24 hours Place 1 patch (14 mg total) onto the skin daily. Start taking on:  12/27/2017   potassium chloride SA 20 MEQ tablet Commonly known as:  K-DUR,KLOR-CON Take 1 tablet (20 mEq total) by mouth 2 (two) times daily.   thiamine 100 MG tablet Take 1 tablet (100 mg total) by mouth daily. Start taking on:  12/27/2017   VENTOLIN HFA 108 (90 Base) MCG/ACT inhaler Generic drug:  albuterol Take 2 puffs 4 (four) times daily as needed by mouth for shortness of breath.       No Known Allergies  Consultations:ct surgery   Procedures/Studies: Dg Chest 2 View  Result Date: 12/20/2017 CLINICAL DATA:  Chest tube, shortness of breath EXAM: CHEST - 2 VIEW COMPARISON:  12/16/2017 FINDINGS: Right basilar chest tube remains in place. Continued loculated right basilar hydropneumothorax. Moderate bilateral pleural effusions. Cardiomegaly. Bilateral airspace disease again noted, right  greater than left. IMPRESSION: No real change since prior study. Continued loculated pneumothorax at the right lung base with moderate bilateral pleural effusions and bilateral airspace disease, right greater than left. Electronically Signed   By: Charlett Nose M.D.   On: 12/20/2017 11:18   Dg Chest 2 View  Result Date: 12/07/2017 CLINICAL DATA:  67 year old male with shortness of breath and cough. EXAM: CHEST - 2 VIEW COMPARISON:  Chest radiograph dated 04/04/2017 FINDINGS: Large area of consolidative change at the right lung base abutting the lateral pleural surface. Although this may represent pneumonia a mass is not excluded. Clinical correlation and follow-up to resolution recommended. There is a small right pleural effusion. The left lung is clear. No pneumothorax. The cardiac silhouette is within normal limits. No acute osseous pathology. IMPRESSION: Right lower lobe consolidation may represent pneumonia. Clinical correlation and follow-up to resolution recommended to exclude an underlying mass. Trace right pleural effusion. Electronically Signed   By: Elgie Collard M.D.   On: 12/07/2017 02:25   Ct Head Wo Contrast  Result Date: 12/07/2017 CLINICAL DATA:  67 year old male with shortness of breath and altered mental status. EXAM: CT HEAD WITHOUT CONTRAST TECHNIQUE: Contiguous axial images were obtained from the base of the skull through the vertex without intravenous contrast. COMPARISON:  None. FINDINGS: Brain: The ventricles and sulci  appropriate size for patient's age. Mild periventricular and deep white matter chronic microvascular ischemic changes noted. 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 There is no acute intracranial hemorrhage. No mass effect or midline shift. No extra-axial fluid collection. Vascular: No hyperdense vessel or unexpected calcification. Skull: Normal. Negative for fracture or focal lesion. Sinuses/Orbits: Mild diffuse  mucoperiosteal thickening of paranasal sinuses. No air-fluid levels. The mastoid air cells are clear. Other: None IMPRESSION: 1. No acute intracranial pathology. 2. Mild chronic microvascular ischemic changes. Electronically Signed   By: Elgie Collard M.D.   On: 12/07/2017 02:23   Ct Chest W Contrast  Result Date: 12/07/2017 CLINICAL DATA:  Cough and shortness of breath, HIV. Unintentional weight loss. EXAM: CT CHEST WITH CONTRAST TECHNIQUE: Multidetector CT imaging of the chest was performed during intravenous contrast administration. CONTRAST:  OMNIPAQUE IOHEXOL 300 MG/ML  SOLN COMPARISON:  Chest radiographs 12/07/2017 and CT chest 03/30/2017. FINDINGS: Cardiovascular: Atherosclerotic calcification of the arterial vasculature, including coronary arteries. Heart is at the upper limits normal in size to mildly enlarged. No pericardial effusion. Mediastinum/Nodes: Mediastinal lymph nodes measure up to 9 mm in the low right paratracheal station. Hilar lymph nodes measure up to 1.4 cm on the right. No axillary adenopathy. Esophagus is grossly unremarkable. Lungs/Pleura: Right lower lobe consolidation with areas of decreased attenuation, air and air-fluid levels. Highly loculated large right pleural effusion. Centrilobular and paraseptal emphysema. Subpleural ground-glass in the posterior left upper lobe, likely infectious or inflammatory in etiology. Small left pleural effusion, simple in appearance, with minimal compressive atelectasis in the left lower lobe. Upper Abdomen: Visualized portion of the liver is mildly heterogeneous, which may be due to arterial phase imaging. There are 2 areas of hyper attenuation within the right hepatic lobe, measuring up to 1.6 cm visualized portions of the adrenal glands, kidneys, spleen, pancreas, stomach and bowel are grossly unremarkable. Upper abdominal lymph nodes are not enlarged by CT size criteria. Musculoskeletal: No worrisome lytic or sclerotic lesions.  IMPRESSION: 1. Right lower lobe consolidation is likely due to pneumonia. Internal fluid, air and air-fluid levels indicate associated necrosis/abscess formation. An underlying mass cannot be definitively excluded. Follow-up to clearing is recommended. 2. Large loculated right pleural effusion is indicative of an empyema. 3. Right hilar adenopathy, likely reactive. This can also be re-evaluated on follow-up imaging. 4. Small left pleural effusion. 5. Hyperattenuating lesions in the right hepatic lobe are difficult to further characterize but may represent perfusion anomalies or flash fill hemangiomas. If further evaluation is desired, MR abdomen without and with contrast is recommended. 6. Aortic atherosclerosis (ICD10-170.0). Coronary artery calcification. 7.  Emphysema (ICD10-J43.9). Electronically Signed   By: Leanna Battles M.D.   On: 12/07/2017 12:57   US Scrotum  Result Date: 12/20/2017 CLINICAL DATA:  Edema. EXAM: ULTRASOUND OF SCROTUM TECHNIQUE: Complete ultrasound examination of the testicles, epididymis, and other scrotal structures was performed. COMPARISON:  None. FINDINGS: Right testicle Measurements: 3.2 x 1.9 x 1.9 cm. No masses. Multiple microcalcifications throughout the right testicle. Left testicle Measurements: 3.3 x 2.0 x 2.0 cm. No masses. Microlithiasis identified. Right epididymis: Possible calcifications in the epididymis of doubtful significance. Left epididymis: Possible punctate calcifications in the epididymis of doubtful significance. Hydrocele:  None visualized. Varicocele:  None visualized. IMPRESSION: 1. Marked skin thickening in the scrotum/perineum. 2. Testicular microlithiasis. Current literature suggests that testicular microlithiasis is not a significant independent risk factor for development of testicular carcinoma, and that follow up imaging is not warranted  in the absence of other risk factors. Monthly testicular self-examination and annual physical exams are considered  appropriate surveillance. If patient has other risk factors for testicular carcinoma, then referral to Urology should be considered. (Reference: DeCastro, et al.: A 5-Year Follow up Study of Asymptomatic Men with Testicular Microlithiasis. J Urol 2008; 179:1420-1423.) 3. No other significant abnormalities. Electronically Signed   By: Gerome Sam III M.D   On: 12/20/2017 17:19   Dg Chest Port 1 View  Result Date: 12/25/2017 CLINICAL DATA:  Hypoxia EXAM: PORTABLE CHEST 1 VIEW COMPARISON:  12/23/2017 FINDINGS: Moderate right pleural effusion, loculated. No definite pneumothorax component. Small left pleural effusion.  No frank interstitial edema. The heart is normal in size. IMPRESSION: Moderate right pleural effusion, loculated. No definite pneumothorax component. Small left pleural effusion. Electronically Signed   By: Charline Bills M.D.   On: 12/25/2017 15:09   Dg Chest Port 1 View  Result Date: 12/23/2017 CLINICAL DATA:  Shortness of breath. EXAM: PORTABLE CHEST 1 VIEW COMPARISON:  12/20/2017. FINDINGS: The previously demonstrated loculated right basilar hydropneumothorax is not visual today. There is increased oval density at the right lung base, including airspace opacity and pleural fluid. Decreased pleural fluid and airspace opacity at the left lung base. No gross change in size of the heart. The pulmonary vasculature and interstitial markings are mildly prominent. Diffuse osteopenia. Thoracic spine degenerative changes. IMPRESSION: 1. Resolved loculated right basilar hydropneumothorax. 2. Mildly increased right basilar pleural fluid and atelectasis or pneumonia. 3. Decreased left basilar pleural fluid and atelectasis or pneumonia. 4. Mild changes of congestive heart failure with pulmonary vascular congestion and mild interstitial pulmonary edema superimposed on chronic interstitial lung disease. Electronically Signed   By: Beckie Salts M.D.   On: 12/23/2017 16:57   Dg Chest Port 1  View  Result Date: 12/16/2017 CLINICAL DATA:  Follow-up chest tube EXAM: PORTABLE CHEST 1 VIEW COMPARISON:  12/14/2017 FINDINGS: Cardiac shadow is stable. Right-sided chest tube is again seen. Fluid is noted loculated within the minor fissure as well as inferiorly on the right. A small left pleural effusion is again seen and stable. No new focal infiltrate is noted. No bony abnormality is seen. IMPRESSION: No significant interval change from the prior exam. Electronically Signed   By: Alcide Clever M.D.   On: 12/16/2017 07:22   Dg Chest Port 1 View  Result Date: 12/14/2017 CLINICAL DATA:  Dyspnea. EXAM: PORTABLE CHEST 1 VIEW COMPARISON:  12/12/2017. FINDINGS: Stable cardiomediastinal silhouette. Pigtail catheter RIGHT lower chest is unchanged, along with asymmetric RIGHT lung opacity. No pneumothorax. IMPRESSION: Stable chest. Electronically Signed   By: Elsie Stain M.D.   On: 12/14/2017 07:17   Dg Chest Port 1 View  Result Date: 12/12/2017 CLINICAL DATA:  Dyspnea, pleural effusions, right-sided chest tube. EXAM: PORTABLE CHEST 1 VIEW COMPARISON:  Portable chest x-ray of December 11, 2017 FINDINGS: Confluent airspace opacity persists in the right mid and lower lung. There is a moderate amount of pleural fluid on the right which is stable. The right pigtail pleural drainage catheter is in stable position inferiorly. On the left the lung is better inflated. There is left basilar atelectasis. There is a small left pleural effusion. The heart is top-normal in size. The pulmonary vascularity is normal. There is calcification in the wall of the aortic arch. The bony thorax is unremarkable. IMPRESSION: Persistent right basilar atelectasis and/or pneumonia with moderate-sized right pleural effusion. Stable positioning of the pigtail catheter on the right. No pneumothorax. Stable small left pleural  effusion and basilar atelectasis. Thoracic aortic atherosclerosis. Electronically Signed   By: David  [place] M.D.   On:  12/12/2017 07:49   Dg Chest Port 1 View  Result Date: 12/11/2017 CLINICAL DATA:  Shortness of breath. EXAM: PORTABLE CHEST 1 VIEW COMPARISON:  12/10/2017 FINDINGS: Pigtail drainage catheter over right lung base unchanged. No evidence pneumothorax. Lungs are adequately inflated with persistent opacification over the right mid to lower lung unchanged likely right-sided effusion with atelectasis. Possible very small amount of left pleural fluid unchanged. Cardiomediastinal silhouette and remainder the exam is unchanged. IMPRESSION: Stable opacification over the right mid to lower lung likely effusion with atelectasis. Pigtail drainage catheter over the right base unchanged. No pneumothorax. Tiny stable amount left pleural fluid. Electronically Signed   By: Elberta Fortis M.D.   On: 12/11/2017 10:51   Dg Chest Port 1 View  Result Date: 12/10/2017 CLINICAL DATA:  Empyema.  Status post drainage catheter placement. EXAM: PORTABLE CHEST 1 VIEW COMPARISON:  CT scan 12/07/2017 and chest x-ray, same date. FINDINGS: The cardiac silhouette, mediastinal and hilar contours are within normal limits and stable. The left lung remains clear. There is a pigtail catheter noted at the right lung base which was placed into the empyema cavity. The loculated right basilar fluid collection is smaller. There is surrounding pneumonia and atelectasis which also appears slightly improved. IMPRESSION: Right basilar pleural drainage catheter in good position with decreased loculated right pleural fluid collection/empyema. Persistent right lower lobe pneumonia and atelectasis but it may be slightly improved. The left lung remains clear. Electronically Signed   By: Rudie Meyer M.D.   On: 12/10/2017 08:40   Ct Image Guided Drainage By Percutaneous Catheter  Result Date: 12/10/2017 INDICATION: Sepsis, pneumonia, HIV, right parapneumonic effusion versus empyema EXAM: CT-GUIDED 14 FRENCH RIGHT CHEST TUBE INSERTION MEDICATIONS: The patient  is currently admitted to the hospital and receiving intravenous antibiotics. The antibiotics were administered within an appropriate time frame prior to the initiation of the procedure. ANESTHESIA/SEDATION: 2.0 mg IV Versed 100 mcg IV Fentanyl Moderate Sedation Time:  20 MINUTES The patient was continuously monitored during the procedure by the interventional radiology nurse under my direct supervision. COMPLICATIONS: None immediate. TECHNIQUE: Informed written consent was obtained from the patient after a thorough discussion of the procedural risks, benefits and alternatives. All questions were addressed. Maximal Sterile Barrier Technique was utilized including caps, mask, sterile gowns, sterile gloves, sterile drape, hand hygiene and skin antiseptic. A timeout was performed prior to the initiation of the procedure. PROCEDURE: The right lateral chest was prepped with ChloraPrep in a sterile fashion, and a sterile drape was applied covering the operative field. A sterile gown and sterile gloves were used for the procedure. Local anesthesia was provided with 1% Lidocaine. Previous imaging reviewed. Patient positioned supine. Noncontrast localization CT performed. The large loculated right pleural effusion was localized. A right lateral approach mid axillary line was marked. Under sterile conditions and local anesthesia, an 18 gauge 10 cm access needle was advanced percutaneously right lateral axillary approach into the pleural effusion. There was return of exudative fluid. Amplatz guidewire inserted followed by tract dilatation to insert a 14 French drain. Drain catheter position confirmed with CT. Chest tube connected to pleura vac. 400 cc removed. Sample sent for culture. Catheter secured with a Prolene suture and a sterile dressing. No immediate complication. Patient tolerated the procedure well. FINDINGS: Imaging confirms needle placed in the right loculated effusion for drain insertion IMPRESSION: Successful  CT-guided 14 French right chest tube  insertion Electronically Signed   By: Judie Petit.  Shick M.D.   On: 12/10/2017 08:17    (Echo, Carotid, EGD, Colonoscopy, ERCP)    Subjective:   Discharge Exam: Vitals:   12/26/17 1101 12/26/17 1500  BP: 139/80 (!) 147/84  Pulse: 78 75  Resp: (!) 26 13  Temp:  97.7 F (36.5 C)  SpO2: 94% 100%   Vitals:   12/26/17 0242 12/26/17 0445 12/26/17 1101 12/26/17 1500  BP:  (!) 95/59 139/80 (!) 147/84  Pulse:  65 78 75  Resp:  (!) 22 (!) 26 13  Temp: 98.4 F (36.9 C) 98.2 F (36.8 C)  97.7 F (36.5 C)  TempSrc: Oral Oral  Oral  SpO2:  97% 94% 100%  Weight:  54.8 kg (120 lb 14.4 oz)    Height:        General: Pt is alert, awake, not in acute distress Cardiovascular: RRR, S1/S2 +, no rubs, no gallops Respiratory: CTA bilaterally, no wheezing, no rhonchi Abdominal: Soft, NT, ND, bowel sounds + Extremities: no edema, no cyanosis    The results of significant diagnostics from this hospitalization (including imaging, microbiology, ancillary and laboratory) are listed below for reference.     Microbiology: No results found for this or any previous visit (from the past 240 hour(s)).   Labs: BNP (last 3 results) Recent Labs    12/21/17 0714  BNP 1,238.9*   Basic Metabolic Panel: Recent Labs  Lab 12/22/17 0401 12/22/17 0649 12/22/17 1529 12/23/17 0359 12/24/17 0514 12/25/17 0407 12/26/17 0438  NA 142  --   --  139 137 138 133*  K 2.7*  --  3.2* 2.7* 3.6 3.9 3.3*  CL 103  --   --  95* 96* 95* 90*  CO2 31  --   --  37* 35* 38* 35*  GLUCOSE 89  --   --  86 100* 89 137*  BUN 6*  --   --  <5* 6* <5* 5*  CREATININE 0.71  --   --  0.69 0.68 0.65 0.75  CALCIUM 7.6*  --   --  7.4* 7.6* 7.8* 7.7*  MG  --  0.9*  --  1.3* 1.5* 1.4*  --    Liver Function Tests: Recent Labs  Lab 12/20/17 0431 12/21/17 0714 12/22/17 0401 12/23/17 0359  AST 86* 76* 83* 89*  ALT 111* 101* 97* 85*  ALKPHOS 79 82 98 97  BILITOT 0.8 0.7 0.9 0.8  PROT 4.9*  5.7* 6.6 6.3*  ALBUMIN 1.3* 1.4* 1.6* 1.6*   No results for input(s): LIPASE, AMYLASE in the last 168 hours. No results for input(s): AMMONIA in the last 168 hours. CBC: Recent Labs  Lab 12/22/17 0401 12/23/17 0359 12/24/17 0514 12/25/17 0407 12/26/17 0438  WBC 6.2 6.3 5.8 5.2 8.0  HGB 10.8* 10.4* 9.4* 9.5* 9.2*  HCT 32.9* 32.2* 29.5* 30.0* 28.6*  MCV 97.1 97.0 98.3 99.3 96.9  PLT 309 293 268 243 228   Cardiac Enzymes: No results for input(s): CKTOTAL, CKMB, CKMBINDEX, TROPONINI in the last 168 hours. BNP: Invalid input(s): POCBNP CBG: No results for input(s): GLUCAP in the last 168 hours. D-Dimer No results for input(s): DDIMER in the last 72 hours. Hgb A1c No results for input(s): HGBA1C in the last 72 hours. Lipid Profile No results for input(s): CHOL, HDL, LDLCALC, TRIG, CHOLHDL, LDLDIRECT in the last 72 hours. Thyroid function studies No results for input(s): TSH, T4TOTAL, T3FREE, THYROIDAB in the last 72 hours.  Invalid input(s): FREET3 Anemia work  up No results for input(s): VITAMINB12, FOLATE, FERRITIN, TIBC, IRON, RETICCTPCT in the last 72 hours. Urinalysis    Component Value Date/Time   COLORURINE YELLOW 12/07/2017 0300   APPEARANCEUR HAZY (A) 12/07/2017 0300   LABSPEC 1.010 12/07/2017 0300   PHURINE 6.0 12/07/2017 0300   GLUCOSEU NEGATIVE 12/07/2017 0300   HGBUR SMALL (A) 12/07/2017 0300   BILIRUBINUR NEGATIVE 12/07/2017 0300   KETONESUR NEGATIVE 12/07/2017 0300   PROTEINUR NEGATIVE 12/07/2017 0300   NITRITE NEGATIVE 12/07/2017 0300   LEUKOCYTESUR NEGATIVE 12/07/2017 0300   Sepsis Labs Invalid input(s): PROCALCITONIN,  WBC,  LACTICIDVEN Microbiology No results found for this or any previous visit (from the past 240 hour(s)).   Time coordinating discharge: 40 minutes  SIGNED:   Alwyn Ren, MD  [facility] 12/26/2017, 4:13 PM Pager   If 7PM-7AM, please contact night-coverage [email or web address] Password [email or web address]

## 2017-12-26 NOTE — Progress Notes (Signed)
Patient's first choice of facility, [facility], will not be able to carve out patient's medications with insurance. CSW informed patient of this and is seeking other bed offers. [address] and [address] did offer, and are now reviewing to determine if they will carve out with [facility] for meds. CSW to follow and support.  Abigail Butts, LCSWA [phone]

## 2017-12-27 NOTE — Clinical Social Work Placement (Signed)
   CLINICAL SOCIAL WORK PLACEMENT  NOTE  Date:  12/27/2017  Patient Details  Name: Kevin Jimenez MRN: [id number] Date of Birth: 1950-10-07  Clinical Social Work is seeking post-discharge placement for this patient at the Skilled  Nursing Facility level of care (*CSW will initial, date and re-position this form in  chart as items are completed):  Yes   Patient/family provided with [facility] Clinical Social Work Department's list of facilities offering this level of care within the geographic area requested by the patient (or if unable, by the patient's family).  Yes   Patient/family informed of their freedom to choose among providers that offer the needed level of care, that participate in Medicare, Medicaid or managed care program needed by the patient, have an available bed and are willing to accept the patient.  Yes   Patient/family informed of [place]'s ownership interest in [facility] and [facility], as well as of the fact that they are under no obligation to receive care at these facilities.  PASRR submitted to EDS on 12/13/17     PASRR number received on 12/13/17     Existing PASRR number confirmed on       FL2 transmitted to all facilities in geographic area requested by pt/family on 12/13/17     FL2 transmitted to all facilities within larger geographic area on       Patient informed that his/her managed care company has contracts with or will negotiate with certain facilities, including the following:  [facility])         Patient/family informed of bed offers received.  Patient chooses bed at [facility] [place]([facility])     Physician recommends and patient chooses bed at      Patient to be transferred to [facility]) on 12/27/17.  Patient to be transferred to facility by PTAR     Patient family notified on 12/27/17 of transfer.  Name of family  member notified:  Lady Saucier, mother     PHYSICIAN       Additional Comment:    _______________________________________________ Abigail Butts, LCSW 12/27/2017, 5:43 PM

## 2017-12-27 NOTE — Progress Notes (Signed)
Occupational Therapy Treatment Patient Details Name: Kevin Jimenez MRN: [id number] DOB: 1951/03/29 Today's Date: 12/27/2017    History of present illness Kevin Jimenez is a 67 y.o. M with a PMH of HIV (elite controller), Chronic Hep C (non compliant with Harvoni), HTN, polysubstance abuse (heroin, tobacco), PNA,  AKI. Pt admitted on 12/07/2017 to ED complaing of weight loss and SOB. Pt diagnosed with sepsis secondary to R sided PNA and Parapneumonica effusion and empyema.   OT comments  Patient pleasant and cooperative, willing to participate in bed level HEP for UE strength and endurance/activitiy tolerance (as he just returned back to bed).  Patient issued yellow theraband and HEP, completing exercises with supervision.  Patient eager to use band outside of session, educated to use 3x/day.  Will continue to follow while admitted.    Follow Up Recommendations  SNF;Supervision/Assistance - 24 hour    Equipment Recommendations  None recommended by OT    Recommendations for Other Services      Precautions / Restrictions Precautions Precautions: Fall Restrictions Weight Bearing Restrictions: No       Mobility Bed Mobility Overal bed mobility: Needs Assistance Bed Mobility: Supine to Sit     Supine to sit: Min guard     General bed mobility comments: declined mobility, as patient just returned back to bed  Transfers Overall transfer level: Needs assistance Equipment used: Rolling walker (2 wheeled) Transfers: Sit to/from [employer] Sit to Stand: Mod assist;+2 safety/equipment Stand pivot transfers: Min assist;+2 safety/equipment       General transfer comment: declined transfers, pt just returned back to bed    Balance Overall balance assessment: Needs assistance Sitting-balance support: Feet supported;No upper extremity supported Sitting balance-Leahy Scale: Fair     Standing balance support: Bilateral upper extremity supported Standing  balance-Leahy Scale: Poor Standing balance comment: reliant on UE support and external assist                           ADL either performed or assessed with clinical judgement   ADL Overall ADL's : Needs assistance/impaired                             Toileting- Clothing Manipulation and Hygiene: Set up;Bed level Toileting - Clothing Manipulation Details (indicate cue type and reason): using urinal       General ADL Comments: session focused on UE strength/endurance     Vision       Perception     Praxis      Cognition Arousal/Alertness: Awake/alert Behavior During Therapy: WFL for tasks assessed/performed Overall Cognitive Status: Within Functional Limits for tasks assessed                                          Exercises Exercises: General Upper Extremity General Exercises - Upper Extremity Shoulder Flexion: Strengthening;Both;10 reps;Supine;Theraband Theraband Level (Shoulder Flexion): Level 1 (Yellow) Shoulder ABduction: Strengthening;Both;10 reps;Supine;Theraband Theraband Level (Shoulder Abduction): Level 1 (Yellow) Shoulder Horizontal ABduction: Strengthening;Both;10 reps;Supine;Theraband Theraband Level (Shoulder Horizontal Abduction): Level 1 (Yellow) Elbow Extension: Strengthening;Both;10 reps;Supine;Theraband Theraband Level (Elbow Extension): Level 1 (Yellow)    Shoulder Instructions       General Comments session focused on B UE strength/endurance with HEP provided    Pertinent Vitals/ Pain       Pain Assessment: No/denies pain  Home Living                                          Prior Functioning/Environment              Frequency  Min 2X/week        Progress Toward Goals  OT Goals(current goals can now be found in the care plan section)  Progress towards OT goals: Progressing toward goals  Acute Rehab OT Goals Patient Stated Goal: to get stronger OT Goal  Formulation: With patient Time For Goal Achievement: 12/27/17 Potential to Achieve Goals: Good  Plan Discharge plan remains appropriate;Frequency remains appropriate    Co-evaluation                 AM-PAC PT "6 Clicks" Daily Activity     Outcome Measure   Help from another person eating meals?: None Help from another person taking care of personal grooming?: None Help from another person toileting, which includes using toliet, bedpan, or urinal?: A Lot Help from another person bathing (including washing, rinsing, drying)?: A Little Help from another person to put on and taking off regular upper body clothing?: A Little Help from another person to put on and taking off regular lower body clothing?: A Lot 6 Click Score: 18    End of Session Equipment Utilized During Treatment: Other (comment)(theraband)  OT Visit Diagnosis: Other abnormalities of gait and mobility (R26.89);Muscle weakness (generalized) (M62.81)   Activity Tolerance Patient tolerated treatment well   Patient Left in bed;with bed alarm set;with call bell/phone within reach   Nurse Communication Mobility status        Time: 1435-1450 OT Time Calculation (min): 15 min  Charges: OT General Charges $OT Visit: 1 Visit OT Treatments $Therapeutic Exercise: 8-22 mins  Chancy Milroy, OTR/L  Pager [phone]    Chancy Milroy 12/27/2017, 4:21 PM

## 2017-12-27 NOTE — Progress Notes (Signed)
PROGRESS NOTE    Kevin Jimenez  UDJ:[id number] DOB: Mar 22, 1951 DOA: 12/07/2017 PCP: Fleet Contras, MD   Brief Narrative:67 year old male with past medical history of HIV, chronic hepatitis C, noncompliant with Harvoni, hypertension, polysubstance abuse including heroin, tobacco abuse, history of a spontaneous pneumothorax who is being managed for right-sided pneumonia with parapneumonic effusion and empyema. He is status post right-sided chest tube placement. He was admitted on 7/17 when he presented to the emergency department with complaints of weight loss, shortness of breath and cough.     Assessment & Plan:   Principal Problem:   Sepsis due to pneumonia [facility]) Active Problems:   History of substance abuse   HIV (+) Human immunodeficiency virus disease (HCC)   Tobacco abuse   Hyponatremia   Acute kidney injury (HCC)   Protein-calorie malnutrition, severe   Pressure injury of skin   Tachycardia   Right-sided pneumonia with lung abscess/empyema -Large right-sided pleural effusion, CT VS was consulted, chest tube was placed and removed.  Patient was treated with Unasyn.  He is being discharged on Augmentin to finish a course of 10 days. Active problems Dyspnea secondary to acute on chronic systolic CHF exacerbation, bilateral pleural effusion likely due to volume overload -Improving, peripheral edema, penile and scrotal edema improving, no shortness of breath this morning.  Will DC him on Lasix 20 mg daily.  Hypokalemia, hypomagnesemia -Replaced IV and oral Paroxysmal atrial fibrillation with RVR -Currently rate controlled, patient had RVR on 7/27 requiring IV Cardizem and IV metoprolol -Continue oral Cardizem and metoprolol -Not a candidate for chronic anticoagulation due to comorbidities empyema and noncompliance HIV/AIDS -Noncompliant with medications, continue Biktarvy -ID was consulted.  Chronic hepatitis C -Outpatient treatment. Noncompliant with  Harvoni  Failure to thrive -Continue Remeron, nutritional supplements, PT  Transaminitis -Improving, hepatitis C versus side effect of Biktarvy  Acute kidney injury with hyponatremia -Resolved, stable at 0.6   Stage II sacral pressure ulcer -Frequent turning, encourage ambulation, wound care        DVT prophylaxis: Lovenox Code Status: DO NOT RESUSCITATE Family Communication: No family available at this time however his mother and his sister was here earlier to talk about replacement with a Child psychotherapist. Disposition Plan: TBD  Consultants:  CT surgery  Procedures: Chest tube placement Antimicrobials None Subjective: He is resting in bed in no acute distress he reports that he has no complaints no nausea vomiting shortness of breath cough fever chest pain or diarrhea.  Objective: Vitals:   12/26/17 1101 12/26/17 1500 12/26/17 2157 12/27/17 0404  BP: 139/80 (!) 147/84 (!) 152/94 115/72  Pulse: 78 75 87 78  Resp: (!) 26 13 (!) 22 (!) 27  Temp:  97.7 F (36.5 C) 98.5 F (36.9 C) 99.3 F (37.4 C)  TempSrc:  Oral Oral Oral  SpO2: 94% 100% 100% 99%  Weight:    52.8 kg (116 lb 6.5 oz)  Height:        Intake/Output Summary (Last 24 hours) at 12/27/2017 0931 Last data filed at 12/27/2017 0752 Gross per 24 hour  Intake 1140 ml  Output 1750 ml  Net -610 ml   Filed Weights   12/25/17 0500 12/26/17 0445 12/27/17 0404  Weight: 56.7 kg (125 lb) 54.8 kg (120 lb 14.4 oz) 52.8 kg (116 lb 6.5 oz)    Examination:  General exam: Appears calm and comfortable  Respiratory system: Clear to auscultation. Respiratory effort normal. Cardiovascular system: S1 & S2 heard, RRR. No JVD, murmurs, rubs, gallops or clicks. No pedal  edema. Gastrointestinal system: Abdomen is nondistended, soft and nontender. No organomegaly or masses felt. Normal bowel sounds heard. Central nervous system: Alert and oriented. No focal neurological deficits. Extremities:trace edema Skin: No  rashes, lesions or ulcers Psychiatry: Judgement and insight appear normal. Mood & affect appropriate.     Data Reviewed: I have personally reviewed following labs and imaging studies  CBC: Recent Labs  Lab 12/22/17 0401 12/23/17 0359 12/24/17 0514 12/25/17 0407 12/26/17 0438  WBC 6.2 6.3 5.8 5.2 8.0  HGB 10.8* 10.4* 9.4* 9.5* 9.2*  HCT 32.9* 32.2* 29.5* 30.0* 28.6*  MCV 97.1 97.0 98.3 99.3 96.9  PLT 309 293 268 243 228   Basic Metabolic Panel: Recent Labs  Lab 12/22/17 0401 12/22/17 0649 12/22/17 1529 12/23/17 0359 12/24/17 0514 12/25/17 0407 12/26/17 0438  NA 142  --   --  139 137 138 133*  K 2.7*  --  3.2* 2.7* 3.6 3.9 3.3*  CL 103  --   --  95* 96* 95* 90*  CO2 31  --   --  37* 35* 38* 35*  GLUCOSE 89  --   --  86 100* 89 137*  BUN 6*  --   --  <5* 6* <5* 5*  CREATININE 0.71  --   --  0.69 0.68 0.65 0.75  CALCIUM 7.6*  --   --  7.4* 7.6* 7.8* 7.7*  MG  --  0.9*  --  1.3* 1.5* 1.4*  --    GFR: Estimated Creatinine Clearance: 66.9 mL/min (by C-G formula based on SCr of 0.75 mg/dL). Liver Function Tests: Recent Labs  Lab 12/21/17 0714 12/22/17 0401 12/23/17 0359  AST 76* 83* 89*  ALT 101* 97* 85*  ALKPHOS 82 98 97  BILITOT 0.7 0.9 0.8  PROT 5.7* 6.6 6.3*  ALBUMIN 1.4* 1.6* 1.6*   No results for input(s): LIPASE, AMYLASE in the last 168 hours. No results for input(s): AMMONIA in the last 168 hours. Coagulation Profile: No results for input(s): INR, PROTIME in the last 168 hours. Cardiac Enzymes: No results for input(s): CKTOTAL, CKMB, CKMBINDEX, TROPONINI in the last 168 hours. BNP (last 3 results) No results for input(s): PROBNP in the last 8760 hours. HbA1C: No results for input(s): HGBA1C in the last 72 hours. CBG: No results for input(s): GLUCAP in the last 168 hours. Lipid Profile: No results for input(s): CHOL, HDL, LDLCALC, TRIG, CHOLHDL, LDLDIRECT in the last 72 hours. Thyroid Function Tests: No results for input(s): TSH, T4TOTAL, FREET4,  T3FREE, THYROIDAB in the last 72 hours. Anemia Panel: No results for input(s): VITAMINB12, FOLATE, FERRITIN, TIBC, IRON, RETICCTPCT in the last 72 hours. Sepsis Labs: No results for input(s): PROCALCITON, LATICACIDVEN in the last 168 hours.  No results found for this or any previous visit (from the past 240 hour(s)).       Radiology Studies: Dg Chest Port 1 View  Result Date: 12/25/2017 CLINICAL DATA:  Hypoxia EXAM: PORTABLE CHEST 1 VIEW COMPARISON:  12/23/2017 FINDINGS: Moderate right pleural effusion, loculated. No definite pneumothorax component. Small left pleural effusion.  No frank interstitial edema. The heart is normal in size. IMPRESSION: Moderate right pleural effusion, loculated. No definite pneumothorax component. Small left pleural effusion. Electronically Signed   By: Charline Bills M.D.   On: 12/25/2017 15:09        Scheduled Meds: . amoxicillin-clavulanate  1 tablet Oral Q12H  . bictegravir-emtricitabine-tenofovir AF  1 tablet Oral Daily  . diltiazem  120 mg Oral Daily  . feeding supplement  1 Container Oral BID BM  . feeding supplement (ENSURE ENLIVE)  237 mL Oral BID BM  . folic acid  1 mg Oral Daily  . furosemide  40 mg Intravenous BID  . Gerhardt's butt cream   Topical BID  . guaiFENesin  600 mg Oral BID  . magnesium oxide  400 mg Oral BID  . metoprolol tartrate  25 mg Oral BID  . multivitamin with minerals  1 tablet Oral Daily  . nicotine  14 mg Transdermal Daily  . potassium chloride  20 mEq Oral BID  . thiamine  100 mg Oral Daily  . traZODone  100 mg Oral QHS   Continuous Infusions:   LOS: 20 days      Alwyn Ren, MD [facility]  If 7PM-7AM, please contact night-coverage [email or web address] Password [email or web address] 12/27/2017, 9:31 AM

## 2017-12-27 NOTE — Progress Notes (Signed)
Physical Therapy Treatment Patient Details Name: Kevin Jimenez MRN: [id number] DOB: 22-Mar-1951 Today's Date: 12/27/2017    History of Present Illness Kevin Jimenez is a 67 y.o. M with a PMH of HIV (elite controller), Chronic Hep C (non compliant with Harvoni), HTN, polysubstance abuse (heroin, tobacco), PNA,  AKI. Pt admitted on 12/07/2017 to ED complaing of weight loss and SOB. Pt diagnosed with sepsis secondary to R sided PNA and Parapneumonica effusion and empyema.    PT Comments    Pt continues to be limited by LE weakness. Able to transfer from bed to chair with mod A +2 for safety. Unable to ambulate at this time secondary to weakness and decreased activity tolerance. SNF placement remains appropriate to maximize functional independence and activity tolerance.     Follow Up Recommendations  SNF;Supervision/Assistance - 24 hour     Equipment Recommendations  Rolling walker with 5" wheels;3in1 (PT)    Recommendations for Other Services       Precautions / Restrictions Precautions Precautions: Fall Restrictions Weight Bearing Restrictions: No    Mobility  Bed Mobility Overal bed mobility: Needs Assistance Bed Mobility: Supine to Sit     Supine to sit: Min guard     General bed mobility comments: min guard for safety. No assist, cues to progress hips forward and rest feet on ground.  Transfers Overall transfer level: Needs assistance Equipment used: Rolling walker (2 wheeled) Transfers: Sit to/from [employer] Sit to Stand: Mod assist;+2 safety/equipment Stand pivot transfers: Min assist;+2 safety/equipment       General transfer comment: Pt stood with mod A to power up and steady. Unable to fully extend BIL knees to support weight. Min A steady during pivot.  Ambulation/Gait                 Stairs             Wheelchair Mobility    Modified Rankin (Stroke Patients Only)       Balance Overall balance assessment: Needs  assistance Sitting-balance support: Feet supported;No upper extremity supported Sitting balance-Leahy Scale: Fair     Standing balance support: Bilateral upper extremity supported Standing balance-Leahy Scale: Poor Standing balance comment: reliant on UE support and external assist                            Cognition Arousal/Alertness: Awake/alert Behavior During Therapy: WFL for tasks assessed/performed Overall Cognitive Status: Within Functional Limits for tasks assessed                                        Exercises General Exercises - Lower Extremity Long Arc Quad: AROM;Both;10 reps;Seated    General Comments        Pertinent Vitals/Pain Pain Assessment: No/denies pain    Home Living                      Prior Function            PT Goals (current goals can now be found in the care plan section) Acute Rehab PT Goals Patient Stated Goal: to get stronger PT Goal Formulation: With patient Time For Goal Achievement: 12/26/17 Potential to Achieve Goals: Good Progress towards PT goals: Progressing toward goals    Frequency    Min 2X/week      PT Plan Current plan remains  appropriate;Frequency needs to be updated    Co-evaluation              AM-PAC PT "6 Clicks" Daily Activity  Outcome Measure  Difficulty turning over in bed (including adjusting bedclothes, sheets and blankets)?: Unable Difficulty moving from lying on back to sitting on the side of the bed? : Unable Difficulty sitting down on and standing up from a chair with arms (e.g., wheelchair, bedside commode, etc,.)?: Unable Help needed moving to and from a bed to chair (including a wheelchair)?: A Little Help needed walking in hospital room?: A Lot Help needed climbing 3-5 steps with a railing? : Total 6 Click Score: 9    End of Session Equipment Utilized During Treatment: Gait belt Activity Tolerance: Patient tolerated treatment well Patient left:  in chair;with call bell/phone within reach;with chair alarm set Nurse Communication: Mobility status PT Visit Diagnosis: Unsteadiness on feet (R26.81);Other abnormalities of gait and mobility (R26.89);Muscle weakness (generalized) (M62.81) Pain - Right/Left: Right Pain - part of body: Hip     Time: 6226-3335 PT Time Calculation (min) (ACUTE ONLY): 12 min  Charges:  $Therapeutic Activity: 8-22 mins                     Kallie Locks, [place] Pager [phone] Acute Rehab   Sheral Apley 12/27/2017, 2:28 PM

## 2017-12-27 NOTE — Progress Notes (Signed)
Report called to Clement Sayres, RN of [facility]. Questions answered. Awaiting [facility] to transport patient out. Elvera Bicker, BSN, RN

## 2017-12-27 NOTE — Progress Notes (Signed)
[facility] and [facility] offered SNF bed and can carve out with insurance for patient's meds. CSW reviewed the two offers with patient at bedside and with mother and sister over the phone, with permission from patient. Patient and family choose [facility]. [facility] can carve out patient's medications with [employer]. Awaiting [employer] auth. CSW to follow.  Abigail Butts, LCSWA [phone]

## 2017-12-27 NOTE — Progress Notes (Signed)
Patient transported to SNF via PT[facility] with his personal belongings and discharge paperwork.  VSS.  Kevin Jimenez

## 2017-12-27 NOTE — Progress Notes (Signed)
[name] has [facility] authorization for patient to admit today.  Patient will discharge to [facility] Anticipated discharge date: 12/27/17 Family notified: Lady Saucier, mother Transportation by: Sharin Mons  Nurse to call report to [phone].   CSW signing off.  Abigail Butts, LCSWA  Clinical Social Worker

## 2017-12-31 LAB — CULTURE, BLOOD (ROUTINE X 2)
CULTURE: NO GROWTH
Culture: NO GROWTH
SPECIAL REQUESTS: ADEQUATE
Special Requests: ADEQUATE

## 2018-02-23 ENCOUNTER — Emergency Department (HOSPITAL_COMMUNITY): Payer: Medicare Other

## 2018-02-23 ENCOUNTER — Encounter (HOSPITAL_COMMUNITY): Payer: Self-pay | Admitting: *Deleted

## 2018-02-23 ENCOUNTER — Inpatient Hospital Stay (HOSPITAL_COMMUNITY): Payer: Medicare Other

## 2018-02-23 ENCOUNTER — Inpatient Hospital Stay (HOSPITAL_COMMUNITY): Payer: Medicare Other | Admitting: Certified Registered"

## 2018-02-23 ENCOUNTER — Inpatient Hospital Stay (HOSPITAL_COMMUNITY)
Admission: EM | Admit: 2018-02-23 | Discharge: 2018-03-24 | DRG: 969 | Disposition: E | Payer: Medicare Other | Attending: General Surgery | Admitting: General Surgery

## 2018-02-23 ENCOUNTER — Other Ambulatory Visit: Payer: Self-pay

## 2018-02-23 ENCOUNTER — Encounter (HOSPITAL_COMMUNITY): Admission: EM | Disposition: E | Payer: Self-pay | Source: Home / Self Care

## 2018-02-23 DIAGNOSIS — K3533 Acute appendicitis with perforation and localized peritonitis, with abscess: Secondary | ICD-10-CM | POA: Diagnosis present

## 2018-02-23 DIAGNOSIS — Z4682 Encounter for fitting and adjustment of non-vascular catheter: Secondary | ICD-10-CM

## 2018-02-23 DIAGNOSIS — K659 Peritonitis, unspecified: Secondary | ICD-10-CM | POA: Diagnosis not present

## 2018-02-23 DIAGNOSIS — I5022 Chronic systolic (congestive) heart failure: Secondary | ICD-10-CM | POA: Diagnosis present

## 2018-02-23 DIAGNOSIS — B182 Chronic viral hepatitis C: Secondary | ICD-10-CM | POA: Diagnosis not present

## 2018-02-23 DIAGNOSIS — A4102 Sepsis due to Methicillin resistant Staphylococcus aureus: Principal | ICD-10-CM | POA: Diagnosis present

## 2018-02-23 DIAGNOSIS — J440 Chronic obstructive pulmonary disease with acute lower respiratory infection: Secondary | ICD-10-CM | POA: Diagnosis present

## 2018-02-23 DIAGNOSIS — R7881 Bacteremia: Secondary | ICD-10-CM | POA: Diagnosis present

## 2018-02-23 DIAGNOSIS — Z87891 Personal history of nicotine dependence: Secondary | ICD-10-CM | POA: Diagnosis not present

## 2018-02-23 DIAGNOSIS — Z978 Presence of other specified devices: Secondary | ICD-10-CM | POA: Diagnosis not present

## 2018-02-23 DIAGNOSIS — I361 Nonrheumatic tricuspid (valve) insufficiency: Secondary | ICD-10-CM | POA: Diagnosis not present

## 2018-02-23 DIAGNOSIS — E872 Acidosis, unspecified: Secondary | ICD-10-CM | POA: Diagnosis present

## 2018-02-23 DIAGNOSIS — R64 Cachexia: Secondary | ICD-10-CM | POA: Diagnosis present

## 2018-02-23 DIAGNOSIS — Z515 Encounter for palliative care: Secondary | ICD-10-CM | POA: Diagnosis not present

## 2018-02-23 DIAGNOSIS — K650 Generalized (acute) peritonitis: Secondary | ICD-10-CM | POA: Diagnosis present

## 2018-02-23 DIAGNOSIS — N136 Pyonephrosis: Secondary | ICD-10-CM | POA: Diagnosis present

## 2018-02-23 DIAGNOSIS — K565 Intestinal adhesions [bands], unspecified as to partial versus complete obstruction: Secondary | ICD-10-CM | POA: Diagnosis present

## 2018-02-23 DIAGNOSIS — Z66 Do not resuscitate: Secondary | ICD-10-CM | POA: Diagnosis not present

## 2018-02-23 DIAGNOSIS — B9562 Methicillin resistant Staphylococcus aureus infection as the cause of diseases classified elsewhere: Secondary | ICD-10-CM | POA: Diagnosis not present

## 2018-02-23 DIAGNOSIS — Z681 Body mass index (BMI) 19 or less, adult: Secondary | ICD-10-CM

## 2018-02-23 DIAGNOSIS — Z9114 Patient's other noncompliance with medication regimen: Secondary | ICD-10-CM

## 2018-02-23 DIAGNOSIS — N401 Enlarged prostate with lower urinary tract symptoms: Secondary | ICD-10-CM | POA: Diagnosis not present

## 2018-02-23 DIAGNOSIS — J189 Pneumonia, unspecified organism: Secondary | ICD-10-CM

## 2018-02-23 DIAGNOSIS — J9601 Acute respiratory failure with hypoxia: Secondary | ICD-10-CM | POA: Diagnosis not present

## 2018-02-23 DIAGNOSIS — R319 Hematuria, unspecified: Secondary | ICD-10-CM | POA: Diagnosis not present

## 2018-02-23 DIAGNOSIS — Z9911 Dependence on respirator [ventilator] status: Secondary | ICD-10-CM

## 2018-02-23 DIAGNOSIS — J90 Pleural effusion, not elsewhere classified: Secondary | ICD-10-CM | POA: Diagnosis present

## 2018-02-23 DIAGNOSIS — F1911 Other psychoactive substance abuse, in remission: Secondary | ICD-10-CM | POA: Diagnosis present

## 2018-02-23 DIAGNOSIS — I110 Hypertensive heart disease with heart failure: Secondary | ICD-10-CM | POA: Diagnosis present

## 2018-02-23 DIAGNOSIS — B20 Human immunodeficiency virus [HIV] disease: Secondary | ICD-10-CM | POA: Diagnosis present

## 2018-02-23 DIAGNOSIS — Z8261 Family history of arthritis: Secondary | ICD-10-CM

## 2018-02-23 DIAGNOSIS — E43 Unspecified severe protein-calorie malnutrition: Secondary | ICD-10-CM | POA: Diagnosis present

## 2018-02-23 DIAGNOSIS — E871 Hypo-osmolality and hyponatremia: Secondary | ICD-10-CM | POA: Diagnosis present

## 2018-02-23 DIAGNOSIS — K460 Unspecified abdominal hernia with obstruction, without gangrene: Secondary | ICD-10-CM | POA: Diagnosis present

## 2018-02-23 DIAGNOSIS — R6521 Severe sepsis with septic shock: Secondary | ICD-10-CM | POA: Diagnosis not present

## 2018-02-23 DIAGNOSIS — J95821 Acute postprocedural respiratory failure: Secondary | ICD-10-CM | POA: Diagnosis not present

## 2018-02-23 DIAGNOSIS — Z8249 Family history of ischemic heart disease and other diseases of the circulatory system: Secondary | ICD-10-CM

## 2018-02-23 DIAGNOSIS — Z8041 Family history of malignant neoplasm of ovary: Secondary | ICD-10-CM

## 2018-02-23 DIAGNOSIS — E875 Hyperkalemia: Secondary | ICD-10-CM | POA: Diagnosis present

## 2018-02-23 DIAGNOSIS — K651 Peritoneal abscess: Secondary | ICD-10-CM | POA: Diagnosis not present

## 2018-02-23 DIAGNOSIS — K3532 Acute appendicitis with perforation and localized peritonitis, without abscess: Secondary | ICD-10-CM | POA: Diagnosis present

## 2018-02-23 DIAGNOSIS — R931 Abnormal findings on diagnostic imaging of heart and coronary circulation: Secondary | ICD-10-CM | POA: Diagnosis not present

## 2018-02-23 DIAGNOSIS — J9690 Respiratory failure, unspecified, unspecified whether with hypoxia or hypercapnia: Secondary | ICD-10-CM

## 2018-02-23 DIAGNOSIS — Z720 Tobacco use: Secondary | ICD-10-CM | POA: Diagnosis present

## 2018-02-23 DIAGNOSIS — A419 Sepsis, unspecified organism: Secondary | ICD-10-CM | POA: Diagnosis not present

## 2018-02-23 DIAGNOSIS — N179 Acute kidney failure, unspecified: Secondary | ICD-10-CM | POA: Diagnosis present

## 2018-02-23 DIAGNOSIS — R338 Other retention of urine: Secondary | ICD-10-CM | POA: Diagnosis not present

## 2018-02-23 DIAGNOSIS — J939 Pneumothorax, unspecified: Secondary | ICD-10-CM | POA: Diagnosis present

## 2018-02-23 DIAGNOSIS — R34 Anuria and oliguria: Secondary | ICD-10-CM | POA: Diagnosis not present

## 2018-02-23 DIAGNOSIS — R579 Shock, unspecified: Secondary | ICD-10-CM | POA: Diagnosis not present

## 2018-02-23 DIAGNOSIS — K567 Ileus, unspecified: Secondary | ICD-10-CM | POA: Diagnosis not present

## 2018-02-23 DIAGNOSIS — J869 Pyothorax without fistula: Secondary | ICD-10-CM | POA: Diagnosis not present

## 2018-02-23 DIAGNOSIS — K56609 Unspecified intestinal obstruction, unspecified as to partial versus complete obstruction: Secondary | ICD-10-CM | POA: Diagnosis present

## 2018-02-23 DIAGNOSIS — N320 Bladder-neck obstruction: Secondary | ICD-10-CM | POA: Diagnosis not present

## 2018-02-23 DIAGNOSIS — Z8701 Personal history of pneumonia (recurrent): Secondary | ICD-10-CM | POA: Diagnosis not present

## 2018-02-23 DIAGNOSIS — Z452 Encounter for adjustment and management of vascular access device: Secondary | ICD-10-CM

## 2018-02-23 DIAGNOSIS — I480 Paroxysmal atrial fibrillation: Secondary | ICD-10-CM | POA: Diagnosis present

## 2018-02-23 HISTORY — PX: LAPAROTOMY: SHX154

## 2018-02-23 LAB — COMPREHENSIVE METABOLIC PANEL
ALK PHOS: 89 U/L (ref 38–126)
ALT: 12 U/L (ref 0–44)
ALT: 9 U/L (ref 0–44)
ANION GAP: 15 (ref 5–15)
AST: 31 U/L (ref 15–41)
AST: 26 U/L (ref 15–41)
Albumin: 2.1 g/dL — ABNORMAL LOW (ref 3.5–5.0)
Albumin: 2 g/dL — ABNORMAL LOW (ref 3.5–5.0)
Alkaline Phosphatase: 55 U/L (ref 38–126)
Anion gap: 10 (ref 5–15)
BILIRUBIN TOTAL: 1.6 mg/dL — AB (ref 0.3–1.2)
BUN: 87 mg/dL — ABNORMAL HIGH (ref 8–23)
BUN: 90 mg/dL — AB (ref 8–23)
CALCIUM: 9.4 mg/dL (ref 8.9–10.3)
CHLORIDE: 107 mmol/L (ref 98–111)
CO2: 14 mmol/L — AB (ref 22–32)
CO2: 15 mmol/L — ABNORMAL LOW (ref 22–32)
CREATININE: 3.89 mg/dL — AB (ref 0.61–1.24)
Calcium: 8.5 mg/dL — ABNORMAL LOW (ref 8.9–10.3)
Chloride: 103 mmol/L (ref 98–111)
Creatinine, Ser: 4.19 mg/dL — ABNORMAL HIGH (ref 0.61–1.24)
GFR calc Af Amer: 17 mL/min — ABNORMAL LOW (ref >60)
GFR calc non Af Amer: 13 mL/min — ABNORMAL LOW (ref >60)
GFR, EST AFRICAN AMERICAN: 16 mL/min — AB (ref >60)
GFR, EST NON AFRICAN AMERICAN: 15 mL/min — AB (ref >60)
Glucose, Bld: 113 mg/dL — ABNORMAL HIGH (ref 70–99)
Glucose, Bld: 101 mg/dL — ABNORMAL HIGH (ref 70–99)
Potassium: 5.4 mmol/L — ABNORMAL HIGH (ref 3.5–5.1)
Potassium: 5.5 mmol/L — ABNORMAL HIGH (ref 3.5–5.1)
SODIUM: 132 mmol/L — AB (ref 135–145)
Sodium: 132 mmol/L — ABNORMAL LOW (ref 135–145)
TOTAL PROTEIN: 7.2 g/dL (ref 6.5–8.1)
Total Bilirubin: 1.8 mg/dL — ABNORMAL HIGH (ref 0.3–1.2)
Total Protein: 6 g/dL — ABNORMAL LOW (ref 6.5–8.1)

## 2018-02-23 LAB — LIPASE, BLOOD: Lipase: 20 U/L (ref 11–51)

## 2018-02-23 LAB — POCT I-STAT 3, ART BLOOD GAS (G3+)
Acid-base deficit: 8 mmol/L — ABNORMAL HIGH (ref 0.0–2.0)
Bicarbonate: 17.6 mmol/L — ABNORMAL LOW (ref 20.0–28.0)
O2 Saturation: 100 %
PO2 ART: 424 mmHg — AB (ref 83.0–108.0)
Patient temperature: 97.6
TCO2: 19 mmol/L — ABNORMAL LOW (ref 22–32)
pCO2 arterial: 35.3 mmHg (ref 32.0–48.0)
pH, Arterial: 7.302 — ABNORMAL LOW (ref 7.350–7.450)

## 2018-02-23 LAB — CBC
HCT: 36.2 % — ABNORMAL LOW (ref 39.0–52.0)
HEMOGLOBIN: 11.8 g/dL — AB (ref 13.0–17.0)
MCH: 29.9 pg (ref 26.0–34.0)
MCHC: 32.6 g/dL (ref 30.0–36.0)
MCV: 91.9 fL (ref 78.0–100.0)
Platelets: 436 10*3/uL — ABNORMAL HIGH (ref 150–400)
RBC: 3.94 MIL/uL — AB (ref 4.22–5.81)
RDW: 15.9 % — ABNORMAL HIGH (ref 11.5–15.5)
WBC: 28 10*3/uL — ABNORMAL HIGH (ref 4.0–10.5)

## 2018-02-23 LAB — MRSA PCR SCREENING: MRSA by PCR: POSITIVE — AB

## 2018-02-23 LAB — I-STAT CG4 LACTIC ACID, ED
LACTIC ACID, VENOUS: 3.03 mmol/L — AB (ref 0.5–1.9)
Lactic Acid, Venous: 2.5 mmol/L (ref 0.5–1.9)

## 2018-02-23 LAB — TYPE AND SCREEN
ABO/RH(D): O POS
ANTIBODY SCREEN: NEGATIVE

## 2018-02-23 LAB — LACTIC ACID, PLASMA: Lactic Acid, Venous: 1.3 mmol/L (ref 0.5–1.9)

## 2018-02-23 SURGERY — LAPAROTOMY, EXPLORATORY
Anesthesia: General | Site: Abdomen

## 2018-02-23 MED ORDER — PIPERACILLIN-TAZOBACTAM 3.375 G IVPB: 3.3750 g | Freq: Three times a day (TID) | INTRAVENOUS | Status: DC

## 2018-02-23 MED ORDER — ETOMIDATE 2 MG/ML IV SOLN
INTRAVENOUS | Status: DC | PRN
  Administered 2018-02-23: 14 mg via INTRAVENOUS

## 2018-02-23 MED ORDER — FENTANYL CITRATE (PF) 250 MCG/5ML IJ SOLN
INTRAMUSCULAR | Status: AC
  Filled 2018-02-23: qty 5

## 2018-02-23 MED ORDER — PIPERACILLIN-TAZOBACTAM 3.375 G IVPB 30 MIN
3.3750 g | INTRAVENOUS | Status: DC
  Filled 2018-02-23 (×2): qty 50

## 2018-02-23 MED ORDER — CISATRACURIUM BESYLATE 20 MG/10ML IV SOLN
INTRAVENOUS | Status: AC
  Filled 2018-02-23: qty 20

## 2018-02-23 MED ORDER — LACTATED RINGERS IV BOLUS
1000.0000 mL | Freq: Once | INTRAVENOUS | Status: AC
  Administered 2018-02-23: 1000 mL via INTRAVENOUS

## 2018-02-23 MED ORDER — IPRATROPIUM-ALBUTEROL 0.5-2.5 (3) MG/3ML IN SOLN
3.0000 mL | Freq: Four times a day (QID) | RESPIRATORY_TRACT | Status: DC
  Administered 2018-02-24 – 2018-02-27 (×14): 3 mL via RESPIRATORY_TRACT
  Filled 2018-02-23 (×13): qty 3

## 2018-02-23 MED ORDER — MIDAZOLAM HCL 5 MG/5ML IJ SOLN
INTRAMUSCULAR | Status: DC | PRN
  Administered 2018-02-23: 1 mg via INTRAVENOUS

## 2018-02-23 MED ORDER — ALBUMIN HUMAN 5 % IV SOLN
INTRAVENOUS | Status: DC | PRN
  Administered 2018-02-23: 19:00:00 via INTRAVENOUS

## 2018-02-23 MED ORDER — FENTANYL CITRATE (PF) 100 MCG/2ML IJ SOLN
INTRAMUSCULAR | Status: DC | PRN
  Administered 2018-02-23 (×3): 50 ug via INTRAVENOUS
  Administered 2018-02-23: 100 ug via INTRAVENOUS

## 2018-02-23 MED ORDER — ORAL CARE MOUTH RINSE
15.0000 mL | OROMUCOSAL | Status: DC
  Administered 2018-02-23 – 2018-02-27 (×33): 15 mL via OROMUCOSAL

## 2018-02-23 MED ORDER — PROPOFOL 500 MG/50ML IV EMUL
INTRAVENOUS | Status: DC | PRN
  Administered 2018-02-23: 50 ug/kg/min via INTRAVENOUS

## 2018-02-23 MED ORDER — SODIUM CHLORIDE 0.9 % IV SOLN
INTRAVENOUS | Status: DC | PRN
  Administered 2018-02-23: 18:00:00 via INTRAVENOUS

## 2018-02-23 MED ORDER — CHLORHEXIDINE GLUCONATE 0.12% ORAL RINSE (MEDLINE KIT)
15.0000 mL | Freq: Two times a day (BID) | OROMUCOSAL | Status: DC
  Administered 2018-02-23 – 2018-02-27 (×8): 15 mL via OROMUCOSAL

## 2018-02-23 MED ORDER — PIPERACILLIN-TAZOBACTAM IN DEX 2-0.25 GM/50ML IV SOLN
2.2500 g | Freq: Three times a day (TID) | INTRAVENOUS | Status: DC
  Administered 2018-02-23 – 2018-02-27 (×12): 2.25 g via INTRAVENOUS
  Filled 2018-02-23 (×13): qty 50

## 2018-02-23 MED ORDER — LACTATED RINGERS IV SOLN: INTRAVENOUS | Status: DC

## 2018-02-23 MED ORDER — FENTANYL BOLUS VIA INFUSION
25.0000 ug | INTRAVENOUS | Status: DC | PRN
  Filled 2018-02-23: qty 25

## 2018-02-23 MED ORDER — SODIUM CHLORIDE 0.9 % IV SOLN
2.0000 g | Freq: Once | INTRAVENOUS | Status: AC
  Administered 2018-02-23: 2 g via INTRAVENOUS
  Filled 2018-02-23: qty 2

## 2018-02-23 MED ORDER — SODIUM BICARBONATE 8.4 % IV SOLN
INTRAVENOUS | Status: DC
  Administered 2018-02-23 – 2018-02-27 (×7): via INTRAVENOUS
  Filled 2018-02-23 (×9): qty 150

## 2018-02-23 MED ORDER — PROPOFOL 10 MG/ML IV BOLUS
INTRAVENOUS | Status: AC
  Filled 2018-02-23: qty 20

## 2018-02-23 MED ORDER — FAMOTIDINE IN NACL 20-0.9 MG/50ML-% IV SOLN: 20.0000 mg | Freq: Two times a day (BID) | INTRAVENOUS | Status: DC

## 2018-02-23 MED ORDER — PIPERACILLIN-TAZOBACTAM 3.375 G IVPB
3.3750 g | Freq: Three times a day (TID) | INTRAVENOUS | Status: DC
  Filled 2018-02-23: qty 50

## 2018-02-23 MED ORDER — 0.9 % SODIUM CHLORIDE (POUR BTL) OPTIME
TOPICAL | Status: DC | PRN
  Administered 2018-02-23 (×2): 3000 mL

## 2018-02-23 MED ORDER — SUCCINYLCHOLINE CHLORIDE 20 MG/ML IJ SOLN
INTRAMUSCULAR | Status: DC | PRN
  Administered 2018-02-23: 100 mg via INTRAVENOUS

## 2018-02-23 MED ORDER — SODIUM CHLORIDE 0.9 % IV SOLN
INTRAVENOUS | Status: DC | PRN
  Administered 2018-02-23 – 2018-02-24 (×2): 250 mL via INTRAVENOUS
  Administered 2018-02-27: 500 mL via INTRAVENOUS

## 2018-02-23 MED ORDER — MIDAZOLAM HCL 2 MG/2ML IJ SOLN
1.0000 mg | INTRAMUSCULAR | Status: DC | PRN
  Filled 2018-02-23 (×3): qty 2

## 2018-02-23 MED ORDER — VANCOMYCIN HCL 500 MG IV SOLR
500.0000 mg | INTRAVENOUS | Status: DC
  Administered 2018-02-25: 500 mg via INTRAVENOUS
  Filled 2018-02-23 (×2): qty 500

## 2018-02-23 MED ORDER — FAMOTIDINE IN NACL 20-0.9 MG/50ML-% IV SOLN
20.0000 mg | Freq: Every day | INTRAVENOUS | Status: DC
  Administered 2018-02-23: 20 mg via INTRAVENOUS
  Filled 2018-02-23: qty 50

## 2018-02-23 MED ORDER — THIAMINE HCL 100 MG/ML IJ SOLN
100.0000 mg | Freq: Every day | INTRAMUSCULAR | Status: DC
  Administered 2018-02-23 – 2018-02-24 (×2): 100 mg via INTRAVENOUS
  Filled 2018-02-23 (×2): qty 2

## 2018-02-23 MED ORDER — MIDAZOLAM HCL 2 MG/2ML IJ SOLN
1.0000 mg | INTRAMUSCULAR | Status: DC | PRN
  Administered 2018-02-23 – 2018-02-27 (×5): 1 mg via INTRAVENOUS
  Filled 2018-02-23 (×2): qty 2

## 2018-02-23 MED ORDER — FENTANYL CITRATE (PF) 100 MCG/2ML IJ SOLN: 50.0000 ug | Freq: Once | INTRAMUSCULAR | Status: DC

## 2018-02-23 MED ORDER — CHLORHEXIDINE GLUCONATE CLOTH 2 % EX PADS
6.0000 | MEDICATED_PAD | Freq: Every day | CUTANEOUS | Status: DC
  Administered 2018-02-24 – 2018-02-27 (×4): 6 via TOPICAL

## 2018-02-23 MED ORDER — CISATRACURIUM BESYLATE (PF) 10 MG/5ML IV SOLN
INTRAVENOUS | Status: DC | PRN
  Administered 2018-02-23: 20 mg via INTRAVENOUS

## 2018-02-23 MED ORDER — SODIUM CHLORIDE 0.9 % IV SOLN
INTRAVENOUS | Status: DC
  Administered 2018-02-24 – 2018-02-26 (×6): via INTRAVENOUS

## 2018-02-23 MED ORDER — FENTANYL 2500MCG IN NS 250ML (10MCG/ML) PREMIX INFUSION
25.0000 ug/h | INTRAVENOUS | Status: DC
  Administered 2018-02-23: 50 ug/h via INTRAVENOUS
  Administered 2018-02-25 – 2018-02-26 (×2): 100 ug/h via INTRAVENOUS
  Administered 2018-02-27: 125 ug/h via INTRAVENOUS
  Filled 2018-02-23 (×6): qty 250

## 2018-02-23 MED ORDER — EPINEPHRINE PF 1 MG/ML IJ SOLN
INTRAMUSCULAR | Status: AC
  Filled 2018-02-23: qty 1

## 2018-02-23 MED ORDER — PIPERACILLIN-TAZOBACTAM 3.375 G IVPB 30 MIN
3.3750 g | INTRAVENOUS | Status: DC
  Filled 2018-02-23: qty 50

## 2018-02-23 MED ORDER — VANCOMYCIN HCL IN DEXTROSE 1-5 GM/200ML-% IV SOLN
1000.0000 mg | Freq: Once | INTRAVENOUS | Status: AC
  Administered 2018-02-23: 1000 mg via INTRAVENOUS
  Filled 2018-02-23: qty 200

## 2018-02-23 MED ORDER — MUPIROCIN 2 % EX OINT
1.0000 "application " | TOPICAL_OINTMENT | Freq: Two times a day (BID) | CUTANEOUS | Status: DC
  Administered 2018-02-23 – 2018-02-27 (×8): 1 via NASAL
  Filled 2018-02-23: qty 22

## 2018-02-23 MED ORDER — FOLIC ACID 5 MG/ML IJ SOLN
1.0000 mg | Freq: Every day | INTRAMUSCULAR | Status: DC
  Administered 2018-02-23 – 2018-02-24 (×2): 1 mg via INTRAVENOUS
  Filled 2018-02-23 (×2): qty 0.2

## 2018-02-23 MED ORDER — LIDOCAINE 2% (20 MG/ML) 5 ML SYRINGE
INTRAMUSCULAR | Status: DC | PRN
  Administered 2018-02-23: 40 mg via INTRAVENOUS

## 2018-02-23 MED ORDER — MIDAZOLAM HCL 2 MG/2ML IJ SOLN
INTRAMUSCULAR | Status: AC
  Filled 2018-02-23: qty 2

## 2018-02-23 SURGICAL SUPPLY — 48 items
BLADE CLIPPER SURG (BLADE) IMPLANT
CANISTER SUCT 3000ML PPV (MISCELLANEOUS) ×3 IMPLANT
CHLORAPREP W/TINT 26ML (MISCELLANEOUS) ×3 IMPLANT
COVER SURGICAL LIGHT HANDLE (MISCELLANEOUS) ×3 IMPLANT
COVER WAND RF STERILE (DRAPES) ×3 IMPLANT
DRAIN CHANNEL 19F RND (DRAIN) ×3 IMPLANT
DRAPE LAPAROSCOPIC ABDOMINAL (DRAPES) ×3 IMPLANT
DRAPE WARM FLUID 44X44 (DRAPE) ×3 IMPLANT
DRSG OPSITE POSTOP 4X10 (GAUZE/BANDAGES/DRESSINGS) IMPLANT
DRSG OPSITE POSTOP 4X8 (GAUZE/BANDAGES/DRESSINGS) IMPLANT
ELECT BLADE 6.5 EXT (BLADE) IMPLANT
ELECT CAUTERY BLADE 6.4 (BLADE) ×3 IMPLANT
ELECT REM PT RETURN 9FT ADLT (ELECTROSURGICAL) ×3
ELECTRODE REM PT RTRN 9FT ADLT (ELECTROSURGICAL) ×1 IMPLANT
EVACUATOR SILICONE 100CC (DRAIN) ×3 IMPLANT
GAUZE SPONGE 4X4 12PLY STRL LF (GAUZE/BANDAGES/DRESSINGS) ×3 IMPLANT
GLOVE BIO SURGEON STRL SZ7 (GLOVE) ×3 IMPLANT
GLOVE BIOGEL PI IND STRL 7.5 (GLOVE) ×1 IMPLANT
GLOVE BIOGEL PI INDICATOR 7.5 (GLOVE) ×2
GOWN STRL REUS W/ TWL LRG LVL3 (GOWN DISPOSABLE) ×2 IMPLANT
GOWN STRL REUS W/TWL LRG LVL3 (GOWN DISPOSABLE) ×4
KIT BASIN OR (CUSTOM PROCEDURE TRAY) ×3 IMPLANT
KIT TURNOVER KIT B (KITS) ×3 IMPLANT
LIGASURE IMPACT 36 18CM CVD LR (INSTRUMENTS) IMPLANT
NS IRRIG 1000ML POUR BTL (IV SOLUTION) ×6 IMPLANT
PACK GENERAL/GYN (CUSTOM PROCEDURE TRAY) ×3 IMPLANT
PAD ABD 8X10 STRL (GAUZE/BANDAGES/DRESSINGS) ×3 IMPLANT
PAD ARMBOARD 7.5X6 YLW CONV (MISCELLANEOUS) ×3 IMPLANT
PENCIL SMOKE EVACUATOR (MISCELLANEOUS) ×3 IMPLANT
SPECIMEN JAR LARGE (MISCELLANEOUS) IMPLANT
SPONGE LAP 18X18 X RAY DECT (DISPOSABLE) IMPLANT
STAPLER VISISTAT 35W (STAPLE) IMPLANT
SUCTION POOLE TIP (SUCTIONS) ×3 IMPLANT
SUT ETHILON 2 0 FS 18 (SUTURE) ×3 IMPLANT
SUT PDS AB 1 TP1 96 (SUTURE) ×6 IMPLANT
SUT SILK 2 0 SH CR/8 (SUTURE) ×3 IMPLANT
SUT SILK 2 0 TIES 10X30 (SUTURE) ×3 IMPLANT
SUT SILK 3 0 SH CR/8 (SUTURE) ×3 IMPLANT
SUT SILK 3 0 TIES 10X30 (SUTURE) ×3 IMPLANT
SUT VIC AB 2-0 CT1 27 (SUTURE) ×2
SUT VIC AB 2-0 CT1 TAPERPNT 27 (SUTURE) ×1 IMPLANT
SUT VIC AB 3-0 SH 18 (SUTURE) IMPLANT
SWAB COLLECTION DEVICE MRSA (MISCELLANEOUS) ×3 IMPLANT
SWAB CULTURE ESWAB REG 1ML (MISCELLANEOUS) ×3 IMPLANT
TAPE CLOTH SURG 6X10 WHT LF (GAUZE/BANDAGES/DRESSINGS) ×3 IMPLANT
TOWEL OR 17X26 10 PK STRL BLUE (TOWEL DISPOSABLE) ×3 IMPLANT
TRAY FOLEY MTR SLVR 16FR STAT (SET/KITS/TRAYS/PACK) IMPLANT
YANKAUER SUCT BULB TIP NO VENT (SUCTIONS) IMPLANT

## 2018-02-23 NOTE — ED Notes (Signed)
Unable to Catheterize pt.

## 2018-02-23 NOTE — Consult Note (Signed)
Urology Consult  Referring physician: Dr. Benjamine Mola Reason for referral: urinary retention  Chief Complaint: Abdominal pain, urinary urgency  History of Present Illness: Mr Kevin Jimenez is a 67yo with a hx of HIV, COPD, polysubstance abuse, and BPH who presented to the ER with severe abdominal pain, nausea, vomiting and an inability to urinate. Prior to presentation he was having severe urgency, frequency, dysuria and a feeling of incomplete emptying. NO hematuria. In the ER he was found to have a distended bladder on CT. Multiple attempts were made by the nursing to place a foley which were unsuccessful. NO other associated symptoms. No exacerbating/alleviating events  Past Medical History:  Diagnosis Date  . Arthritis    "mainly right hand" (12/07/2017)  . Chronic hepatitis C ([place])   . COPD (chronic obstructive pulmonary disease) ([place])   . History of substance abuse ([place]) 03/31/2015   Alcohol and heroin abuse   . HIV (human immunodeficiency virus infection) ([place])    "dx'd in the late 1990s"  . Hypertension   . Pneumonia 12/07/2017   Past Surgical History:  Procedure Laterality Date  . INGUINAL HERNIA REPAIR Right     Medications: I have reviewed the patient's current medications. Allergies: No Known Allergies  Family History  Problem Relation Age of Onset  . Hypertension Mother   . Rheum arthritis Mother   . Ovarian cancer Daughter        Deceased at age 22   Social History:  reports that he has quit smoking. His smoking use included cigarettes. He has a 4.70 pack-year smoking history. He has never used smokeless tobacco. He reports that he drinks about 6.0 standard drinks of alcohol per week. He reports that he has current or past drug history. Drugs: Heroin, "Crack" cocaine, and Marijuana.  Review of Systems  Gastrointestinal: Positive for abdominal pain, nausea and vomiting.  Genitourinary: Positive for dysuria, frequency and urgency.  All other systems reviewed and are  negative.   Physical Exam:  Vital signs in last 24 hours: Temp:  [97.9 F (36.6 C)] 97.9 F (36.6 C) (10/03 1025) Pulse Rate:  [82-102] 90 (10/03 1951) Resp:  [18-35] 18 (10/03 1951) BP: (139-161)/(86-98) 146/92 (10/03 1600) SpO2:  [97 %-100 %] 100 % (10/03 1951) FiO2 (%):  [60 %-100 %] 60 % (10/03 2056) Weight:  [47.6 kg] 47.6 kg (10/03 1025) Physical Exam  Constitutional: He is oriented to person, place, and time. He appears well-developed and well-nourished.  HENT:  Head: Normocephalic and atraumatic.  Eyes: Pupils are equal, round, and reactive to light. EOM are normal.  Neck: Normal range of motion. No thyromegaly present.  Cardiovascular: Normal rate and regular rhythm.  Respiratory: Effort normal. No respiratory distress.  GI: Soft. He exhibits distension. There is tenderness. Hernia confirmed negative in the right inguinal area and confirmed negative in the left inguinal area.  Genitourinary: Testes normal and penis normal. Circumcised.  Musculoskeletal: Normal range of motion. He exhibits no edema.  Lymphadenopathy:       Right: No inguinal adenopathy present.       Left: No inguinal adenopathy present.  Neurological: He is alert and oriented to person, place, and time.  Skin: Skin is warm and dry.  Psychiatric: He has a normal mood and affect. His behavior is normal. Judgment and thought content normal.    Laboratory Data:  Results for orders placed or performed during the hospital encounter of 02/25/2018 (from the past 72 hour(s))  Lipase, blood     Status: None  Collection Time: 03/21/2018 10:36 AM  Result Value Ref Range   Lipase 20 11 - 51 U/L    Comment: Performed at [facility], [place] [address], [place], [place] [address]  Comprehensive metabolic panel     Status: Abnormal   Collection Time: 02/21/2018 10:36 AM  Result Value Ref Range   Sodium 132 (L) 135 - 145 mmol/L   Potassium 5.4 (H) 3.5 - 5.1 mmol/L   Chloride 103 98 - 111 mmol/L   CO2 14 (L) 22 -  32 mmol/L   Glucose, Bld 113 (H) 70 - 99 mg/dL   BUN 87 (H) 8 - 23 mg/dL   Creatinine, Ser 4.19 (H) 0.61 - 1.24 mg/dL   Calcium 9.4 8.9 - 10.3 mg/dL   Total Protein 7.2 6.5 - 8.1 g/dL   Albumin 2.1 (L) 3.5 - 5.0 g/dL   AST 31 15 - 41 U/L   ALT 12 0 - 44 U/L   Alkaline Phosphatase 89 38 - 126 U/L   Total Bilirubin 1.6 (H) 0.3 - 1.2 mg/dL   GFR calc non Af Amer 13 (L) >60 mL/min   GFR calc Af Amer 16 (L) >60 mL/min    Comment: (NOTE) The eGFR has been calculated using the CKD EPI equation. This calculation has not been validated in all clinical situations. eGFR's persistently <60 mL/min signify possible Chronic Kidney Disease.    Anion gap 15 5 - 15    Comment: Performed at [facility] [address] [place], [place] [address]  CBC     Status: Abnormal   Collection Time: 03/02/2018 10:36 AM  Result Value Ref Range   WBC 28.0 (H) 4.0 - 10.5 K/uL    Comment: REPEATED TO VERIFY   RBC 3.94 (L) 4.22 - 5.81 MIL/uL   Hemoglobin 11.8 (L) 13.0 - 17.0 g/dL   HCT 36.2 (L) 39.0 - 52.0 %   MCV 91.9 78.0 - 100.0 fL   MCH 29.9 26.0 - 34.0 pg   MCHC 32.6 30.0 - 36.0 g/dL   RDW 15.9 (H) 11.5 - 15.5 %   Platelets 436 (H) 150 - 400 K/uL    Comment: Performed at [facility], [place] [address], [place], [place] [address]  Blood Culture (routine x 2)     Status: None (Preliminary result)   Collection Time: 03/03/2018 11:58 AM  Result Value Ref Range   Specimen Description BLOOD RIGHT ANTECUBITAL    Special Requests      BOTTLES DRAWN AEROBIC AND ANAEROBIC Blood Culture adequate volume   Culture      NO GROWTH < 12 HOURS Performed at [facility], [place] [address], [place], [place] [address]    Report Status PENDING   Type and screen [place]     Status: None   Collection Time: 03/04/2018 12:00 PM  Result Value Ref Range   ABO/RH(D) O POS    Antibody Screen NEG    Sample Expiration      02/26/2018 Performed at [facility], [place] [address]  [place], [place] [address]   Blood Culture (routine x 2)     Status: None (Preliminary result)   Collection Time: 03/03/2018 12:10 PM  Result Value Ref Range   Specimen Description BLOOD LEFT ANTECUBITAL    Special Requests      BOTTLES DRAWN AEROBIC AND ANAEROBIC Blood Culture adequate volume   Culture      NO GROWTH < 12 HOURS Performed  at [facility], [place] [address], [place], [place] [address]    Report Status PENDING   I-Stat CG4 Lactic Acid, ED  (not at  [facility])     Status: Abnormal   Collection Time: 03/12/2018 12:22 PM  Result Value Ref Range   Lactic Acid, Venous 3.03 (HH) 0.5 - 1.9 mmol/L   Comment NOTIFIED PHYSICIAN   I-Stat CG4 Lactic Acid, ED  (not at  [facility])     Status: Abnormal   Collection Time: 03/08/2018  2:52 PM  Result Value Ref Range   Lactic Acid, Venous 2.50 (HH) 0.5 - 1.9 mmol/L   Comment NOTIFIED PHYSICIAN   I-STAT 3, arterial blood gas (G3+)     Status: Abnormal   Collection Time: 03/12/2018  8:54 PM  Result Value Ref Range   pH, Arterial 7.302 (L) 7.350 - 7.450   pCO2 arterial 35.3 32.0 - 48.0 mmHg   pO2, Arterial 424.0 (H) 83.0 - 108.0 mmHg   Bicarbonate 17.6 (L) 20.0 - 28.0 mmol/L   TCO2 19 (L) 22 - 32 mmol/L   O2 Saturation 100.0 %   Acid-base deficit 8.0 (H) 0.0 - 2.0 mmol/L   Patient temperature 97.6 F    Collection site RADIAL, ALLEN'S TEST ACCEPTABLE    Drawn by RT    Sample type ARTERIAL   Comprehensive metabolic panel     Status: Abnormal   Collection Time: 03/23/2018  8:57 PM  Result Value Ref Range   Sodium 132 (L) 135 - 145 mmol/L   Potassium 5.5 (H) 3.5 - 5.1 mmol/L   Chloride 107 98 - 111 mmol/L   CO2 15 (L) 22 - 32 mmol/L   Glucose, Bld 101 (H) 70 - 99 mg/dL   BUN 90 (H) 8 - 23 mg/dL   Creatinine, Ser 3.89 (H) 0.61 - 1.24 mg/dL   Calcium 8.5 (L) 8.9 - 10.3 mg/dL   Total Protein 6.0 (L) 6.5 - 8.1 g/dL   Albumin 2.0 (L) 3.5 - 5.0 g/dL   AST 26 15 - 41 U/L   ALT 9 0 - 44 U/L   Alkaline Phosphatase 55 38 - 126 U/L   Total Bilirubin 1.8 (H)  0.3 - 1.2 mg/dL   GFR calc non Af Amer 15 (L) >60 mL/min   GFR calc Af Amer 17 (L) >60 mL/min    Comment: (NOTE) The eGFR has been calculated using the CKD EPI equation. This calculation has not been validated in all clinical situations. eGFR's persistently <60 mL/min signify possible Chronic Kidney Disease.    Anion gap 10 5 - 15    Comment: Performed at [facility] [address] [place], [place] [address]   Recent Results (from the past 240 hour(s))  Blood Culture (routine x 2)     Status: None (Preliminary result)   Collection Time: 03/20/2018 11:58 AM  Result Value Ref Range Status   Specimen Description BLOOD RIGHT ANTECUBITAL  Final   Special Requests   Final    BOTTLES DRAWN AEROBIC AND ANAEROBIC Blood Culture adequate volume   Culture   Final    NO GROWTH < 12 HOURS Performed at [facility], [place] [address], [place], [place] [address]    Report Status PENDING  Incomplete  Blood Culture (routine x 2)     Status: None (Preliminary result)   Collection Time: 03/10/2018 12:10 PM  Result Value Ref Range Status   Specimen Description BLOOD LEFT ANTECUBITAL  Final   Special Requests  Final    BOTTLES DRAWN AEROBIC AND ANAEROBIC Blood Culture adequate volume   Culture   Final    NO GROWTH < 12 HOURS Performed at [facility], [place] [address], [place], [place] [address]    Report Status PENDING  Incomplete   Creatinine: Recent Labs    02/25/2018 1036 03/16/2018 2057  CREATININE 4.19* 3.89*   Baseline Creatinine: unknown  Impression/Assessment:  67yo with BPH with urinary retention  Plan:  1. 20 french coude foley placed without incident and 900cc of purulent urine drained. Please start tamsulosin 0.68m daily. Please start broad spectrum antibiotics and send urine for culture. The patient should be discharged with the foley in place and followup with [facility] 1 week after discharge for a voiding trial  PNicolette Bang10/07/2017, 10:08 PM

## 2018-02-23 NOTE — Anesthesia Postprocedure Evaluation (Signed)
Anesthesia Post Note  Patient: Kevin Jimenez  Procedure(s) Performed: EXPLORATORY LAPAROTOMY (N/A Abdomen)     Patient location during evaluation: SICU Anesthesia Type: General Level of consciousness: sedated Pain management: pain level controlled Vital Signs Assessment: post-procedure vital signs reviewed and stable Respiratory status: patient remains intubated per anesthesia plan Cardiovascular status: stable Postop Assessment: no apparent nausea or vomiting Anesthetic complications: no    Last Vitals:  Vitals:   March 12, 2018 1600 March 12, 2018 1951  BP: (!) 146/92   Pulse: 99 90  Resp:  18  Temp:    SpO2: 99% 100%                  Cadance Raus DANIEL

## 2018-02-23 NOTE — Progress Notes (Addendum)
Pharmacy Antibiotic Note  Kevin Jimenez is a 67 y.o. male admitted on 02/24/2018 with Perforated appendicitis with intra-abdominal abcsess and small bowel obstruction.Marland Kitchen  Pharmacy has been consulted for Zosyn and Vanco dosing.  CC/HPI: 23 YOM who presented on 10/3 with abd pain, SOB, weakness. CT findings c/w SBO   PMH: arthritis, Hep C, COPD, PSA, HIV, HTN, ETOH, heroin, tobacco, recent PNA, h/o abdominal surgery (inguinal hernia repair)  Significant events: previously admitted 7/17-8/6 for R-sided PNA with abscess and empyema requiring chest tube placement and acute on chronic systolic CHF.  ID: h/o HIV,  Abx for r/o Perforated appendicitis with intra-abdominal abcsess and small bowel obstruction + UTI (pus with foley placement) + PNA on CT. Afeb, WBC 28. LA elevated. Scr 0.75>4.19 today.  10/3 Zosyn>> 10/3 Vanco>>  Plan: Vanc 1 g IV x 1 in the ED, then Vancomycin 500mg  IV q 48 hrs. Zosyn 2.25g IV q 8 hrs until Scr improves  Height: 5\' 11"  (180.3 cm) Weight: 105 lb (47.6 kg) IBW/kg (Calculated) : 75.3  Temp (24hrs), Avg:97.9 F (36.6 C), Min:97.9 F (36.6 C), Max:97.9 F (36.6 C)  Recent Labs  Lab 03/18/2018 1036 02/26/2018 1222 03/12/2018 1452  WBC 28.0*  --   --   CREATININE 4.19*  --   --   LATICACIDVEN  --  3.03* 2.50*    Estimated Creatinine Clearance: 11.5 mL/min (A) (by C-G formula based on SCr of 4.19 mg/dL (H)).    No Known Allergies  Leoni Goodness S. 04/25/18, PharmD, BCPS Clinical Staff Pharmacist  04/25/18 [place] 03/21/2018 7:50 PM

## 2018-02-23 NOTE — Anesthesia Procedure Notes (Signed)
Procedure Name: Intubation Date/Time: 03/03/2018 6:14 PM Performed by: Cleda Daub, CRNA Pre-anesthesia Checklist: Patient identified, Emergency Drugs available, Suction available and Patient being monitored Patient Re-evaluated:Patient Re-evaluated prior to induction Oxygen Delivery Method: Circle system utilized Preoxygenation: Pre-oxygenation with 100% oxygen Induction Type: IV induction, Cricoid Pressure applied and Rapid sequence Ventilation: Mask ventilation without difficulty and Mask ventilation throughout procedure Laryngoscope Size: Mac and 3 Grade View: Grade I Tube type: Subglottic suction tube Tube size: 8.0 mm Number of attempts: 1 Airway Equipment and Method: Stylet Placement Confirmation: ETT inserted through vocal cords under direct vision,  positive ETCO2 and breath sounds checked- equal and bilateral Secured at: 22 cm Tube secured with: Tape Dental Injury: Teeth and Oropharynx as per pre-operative assessment

## 2018-02-23 NOTE — Consult Note (Signed)
Consult Note    Kevin Jimenez MCN:[id number] DOB: [birth year] DOA: 2018-02-26  PCP: Fleet Contras, MD Consultants:  [facility] Patient coming from:  Home - lives with mother; NOK: Mother  Chief Complaint: abdominal pain  HPI: Kevin Jimenez is a 67 y.o. male with medical history significant of HTN; HIV; ETOH and heroin dependence; tobacco dependence COPD; and Hep C presenting with abdominal pain.  History was difficult to obtain due to his acute medical conditions.  He reports that his doctor told him to come in.  He was able to void once today.  He had a BM today and once yesterday.  He reports absolute compliance with his medications since his last hospitalization and denies use of drugs or alcohol since then.  He reports quitting smoking 1 week ago.  He would like to have a nicotine patch.    He was previously admitted 7/17-8/6 for R-sided PNA with abscess and empyema requiring chest tube placement and acute on chronic systolic CHF.  ED Course:  Multifocal PNA, SBO, large bladder and unable to place foley.  Urology is there placing foley.  Surgery is at the bedside evaluating the patient.  Given Vanc, Cefepime.  +tachypnea, tachycardia.    Review of Systems: As per HPI; otherwise review of systems reviewed and negative.  This is suspect given the acuity of his medical problems.  Ambulatory Status:  Ambulates with walker  Past Medical History:  Diagnosis Date  . Arthritis    "mainly right hand" (12/07/2017)  . Chronic hepatitis C (HCC)   . COPD (chronic obstructive pulmonary disease) (HCC)   . History of substance abuse (HCC) 03/31/2015   Alcohol and heroin abuse   . HIV (human immunodeficiency virus infection) (HCC)    "dx'd in the late 1990s"  . Hypertension   . Pneumonia 12/07/2017    Past Surgical History:  Procedure Laterality Date  . INGUINAL HERNIA REPAIR Right     Social History   Socioeconomic History  . Marital status: Single    Spouse name: Not on file  .  Number of children: Not on file  . Years of education: Not on file  . Highest education level: Not on file  Occupational History  . Not on file  Social Needs  . Financial resource strain: Not on file  . Food insecurity:    Worry: Not on file    Inability: Not on file  . Transportation needs:    Medical: Not on file    Non-medical: Not on file  Tobacco Use  . Smoking status: Former Smoker    Packs/day: 0.10    Years: 47.00    Pack years: 4.70    Types: Cigarettes  . Smokeless tobacco: Never Used  Substance and Sexual Activity  . Alcohol use: Yes    Alcohol/week: 6.0 standard drinks    Types: 6 Cans of beer per week  . Drug use: Not Currently    Types: Heroin, "Crack" cocaine, Marijuana    Comment: 12/07/2017 "haven't used any drugs in the 2000s"  . Sexual activity: Not Currently    Partners: Female    Birth control/protection: Condom  Lifestyle  . Physical activity:    Days per week: Not on file    Minutes per session: Not on file  . Stress: Not on file  Relationships  . Social connections:    Talks on phone: Not on file    Gets together: Not on file    Attends religious service: Not on file  Active member of club or organization: Not on file    Attends meetings of clubs or organizations: Not on file    Relationship status: Not on file  . Intimate partner violence:    Fear of current or ex partner: Not on file    Emotionally abused: Not on file    Physically abused: Not on file    Forced sexual activity: Not on file  Other Topics Concern  . Not on file  Social History Narrative  . Not on file    No Known Allergies  Family History  Problem Relation Age of Onset  . Hypertension Mother   . Rheum arthritis Mother   . Ovarian cancer Daughter        Deceased at age 89    Prior to Admission medications   Medication Sig Start Date End Date Taking? Authorizing Provider  amoxicillin-clavulanate (AUGMENTIN) 875-125 MG tablet Take 1 tablet by mouth every 12  (twelve) hours. 12/26/17   Alwyn Ren, MD  bictegravir-emtricitabine-tenofovir AF (BIKTARVY) 50-200-25 MG TABS tablet Take 1 tablet by mouth daily. 12/27/17   Alwyn Ren, MD  diltiazem (CARDIZEM CD) 120 MG 24 hr capsule Take 1 capsule (120 mg total) by mouth daily. 12/27/17   Alwyn Ren, MD  folic acid (FOLVITE) 1 MG tablet Take 1 tablet (1 mg total) by mouth daily. 12/27/17   Alwyn Ren, MD  furosemide (LASIX) 20 MG tablet Take 1 tablet (20 mg total) by mouth daily. 12/26/17 12/26/18  Alwyn Ren, MD  Hydrocortisone (GERHARDT'S BUTT CREAM) CREA Apply 1 application topically 2 (two) times daily. 12/26/17   Alwyn Ren, MD  magnesium oxide (MAG-OX) 400 (241.3 Mg) MG tablet Take 1 tablet (400 mg total) by mouth 2 (two) times daily. 12/26/17   Alwyn Ren, MD  metoprolol tartrate (LOPRESSOR) 25 MG tablet Take 1 tablet (25 mg total) by mouth 2 (two) times daily. 12/26/17   Alwyn Ren, MD  Multiple Vitamin (MULTIVITAMIN WITH MINERALS) TABS tablet Take 1 tablet by mouth daily. 12/26/17   Alwyn Ren, MD  nicotine (NICODERM CQ - DOSED IN MG/24 HOURS) 14 mg/24hr patch Place 1 patch (14 mg total) onto the skin daily. 12/27/17   Alwyn Ren, MD  potassium chloride SA (K-DUR,KLOR-CON) 20 MEQ tablet Take 1 tablet (20 mEq total) by mouth 2 (two) times daily. 12/26/17   Alwyn Ren, MD  thiamine 100 MG tablet Take 1 tablet (100 mg total) by mouth daily. 12/27/17   Alwyn Ren, MD  VENTOLIN HFA 108 [phone] Base) MCG/ACT inhaler Take 2 puffs 4 (four) times daily as needed by mouth for shortness of breath. 02/03/17   [provider]    Physical Exam: Vitals:   02/28/2018 1300 03/10/2018 1400 03/16/2018 1545 03/21/2018 1600  BP: (!) 151/96 (!) 157/90 (!) 161/98 (!) 146/92  Pulse: 88 89 (!) 102 99  Resp:  (!) 35 (!) 28   Temp:      TempSrc:      SpO2: 99% 97% 99% 99%  Weight:      Height:         General:  Appears acute on  chronically ill Eyes:  PERRL, EOMI, normal lids, iris ENT:  grossly normal hearing, dry mm with diffuse coating on tongue; very poor dentition Neck:  no LAD, masses or thyromegaly Cardiovascular: RRR with mild tachycardia, no m/r/g. No LE edema.  Respiratory:   CTA bilaterally with no wheezes/rales/rhonchi.  Normal respiratory effort.  Abdomen: Distended, taut, diffusely TTP with minimal BS Skin:  no rash or induration seen on limited exam Musculoskeletal:  grossly normal tone BUE/BLE, good ROM, no bony abnormality Psychiatric: blunted mood and affect, speech limited Neurologic: Unable to perform GU: foley placed by Dr. Ronne Binning while I was in the room with immediate return of 950 cc urine with gross pus    Radiological Exams on Admission: Ct Abdomen Pelvis Wo Contrast  Result Date: 02/28/2018 CLINICAL DATA:  Abdominal pain, shortness of breath and weakness. Recent pneumonia with an associated effusion and sepsis. EXAM: CT CHEST, ABDOMEN AND PELVIS WITHOUT CONTRAST TECHNIQUE: Multidetector CT imaging of the chest, abdomen and pelvis was performed following the standard protocol without IV contrast. COMPARISON:  Chest radiographs obtained earlier today. Chest CT dated 12/07/2017. FINDINGS: CT CHEST FINDINGS Cardiovascular: Atheromatous calcifications, including the coronary arteries and aorta. Mediastinum/Nodes: No enlarged mediastinal, hilar, or axillary lymph nodes. Thyroid gland, trachea, and esophagus demonstrate no significant findings. Lungs/Pleura: Moderate-sized loculated right pleural effusion laterally. Minimal non loculated pleural fluid on the right. No pleural fluid on the left. Interval multiple focal patchy and nodular opacities in both upper lobes. Mild patchy opacity and volume loss in the right lower lobe. Minimal interval patchy opacity in the left lower lobe. Extensive bilateral bullous changes. Musculoskeletal: Thoracic and lower cervical spine degenerative changes. CT ABDOMEN  PELVIS FINDINGS Hepatobiliary: 2 gallstones in the dependent portion of the gallbladder, measuring up to 7 mm in maximum diameter each. No gallbladder wall thickening or pericholecystic fluid. Unremarkable liver. Pancreas: Unremarkable. No pancreatic ductal dilatation or surrounding inflammatory changes. Spleen: Normal in size without focal abnormality. Adrenals/Urinary Tract: Dilated urinary bladder, ureters and renal collecting systems. No calculi or masses seen. Stomach/Bowel: Moderate dilatation of the majority of the small bowel with normal caliber distal loops. There is some mesenteric rotation with an appearance suggesting an internal hernia. No bowel wall thickening or pneumatosis. Unremarkable stomach and colon. The appendix is difficult to differentiate from fluid-filled small bowel loops with no gross evidence of appendicitis. Vascular/Lymphatic: Atheromatous arterial calcifications without aneurysm. No enlarged lymph nodes. Reproductive: Moderately enlarged and partially calcified prostate gland. Other: Proximal right inguinal hernia containing fat. Musculoskeletal: Severe right hip degenerative changes. Mild-to-moderate left hip degenerative changes. Mild lumbar spine degenerative changes. IMPRESSION: 1. Complete or high-grade partial small bowel obstruction with findings suggesting that this is due to an internal hernia. 2. Interval multiple focal patchy and nodular opacities in both upper lobes, compatible with multifocal pneumonia. 3. Minimal left lower lobe pneumonia and mild to moderate right lower lobe atelectasis/pneumonia. 4. Dilated urinary bladder with associated moderate bilateral hydroureter and mild to moderate bilateral hydronephrosis. The dilated bladder may be due to bladder outlet obstruction by the patient's moderately enlarged prostate gland. 5. Decreased size of a moderate-sized loculated right pleural effusion. 6.  Calcific coronary artery and aortic atherosclerosis. 7. Extensive  bilateral bullous emphysema. 8. Cholelithiasis. 9. Severe right hip degenerative changes. Aortic Atherosclerosis (ICD10-I70.0) and Emphysema (ICD10-J43.9). Electronically Signed   By: Beckie Salts M.D.   On: 02/24/2018 13:33   Dg Chest 2 View  Result Date: 03/18/2018 CLINICAL DATA:  Acute shortness of breath. EXAM: CHEST - 2 VIEW COMPARISON:  12/25/2017 and prior radiographs FINDINGS: The cardiomediastinal silhouette is unchanged. RIGHT pleural effusion which appears loculated appears decreased in size from the prior study. RIGHT basilar atelectasis/scarring noted. No pneumothorax or new pulmonary opacities identified. No acute bony abnormalities are noted. IMPRESSION: Decreased RIGHT pleural effusion which appears loculated. Associated RIGHT basilar atelectasis/scarring.  Electronically Signed   By: Harmon Pier M.D.   On: 03/06/2018 12:58   Dg Abdomen 1 View  Result Date: 2018-03-06 CLINICAL DATA:  Nasogastric tube placement.  Bowel obstruction. EXAM: ABDOMEN - 1 VIEW COMPARISON:  CT chest, abdomen and pelvis 2018-03-06. FINDINGS: Nasogastric tube tip projecting in RIGHT chest. Reticulonodular densities in the lungs with lobulated RIGHT pleural density, stable from today's CT. Multiples loops of gas distended small and large bowel in the included abdomen. Cholelithiasis. Severe degenerative changes RIGHT hip. Aortoiliac calcifications. IMPRESSION: Nasogastric tube tip projecting RIGHT chest.  Recommend removal. Critical Value/emergent results were called by telephone at the time of interpretation on 03-06-2018 at 4:36 pm to Dr. Cicero Duck RICE , who verbally acknowledged these results. Electronically Signed   By: Awilda Metro M.D.   On: 2018-03-06 16:37   Ct Chest Wo Contrast  Result Date: 03-06-2018 CLINICAL DATA:  Abdominal pain, shortness of breath and weakness. Recent pneumonia with an associated effusion and sepsis. EXAM: CT CHEST, ABDOMEN AND PELVIS WITHOUT CONTRAST TECHNIQUE: Multidetector CT  imaging of the chest, abdomen and pelvis was performed following the standard protocol without IV contrast. COMPARISON:  Chest radiographs obtained earlier today. Chest CT dated 12/07/2017. FINDINGS: CT CHEST FINDINGS Cardiovascular: Atheromatous calcifications, including the coronary arteries and aorta. Mediastinum/Nodes: No enlarged mediastinal, hilar, or axillary lymph nodes. Thyroid gland, trachea, and esophagus demonstrate no significant findings. Lungs/Pleura: Moderate-sized loculated right pleural effusion laterally. Minimal non loculated pleural fluid on the right. No pleural fluid on the left. Interval multiple focal patchy and nodular opacities in both upper lobes. Mild patchy opacity and volume loss in the right lower lobe. Minimal interval patchy opacity in the left lower lobe. Extensive bilateral bullous changes. Musculoskeletal: Thoracic and lower cervical spine degenerative changes. CT ABDOMEN PELVIS FINDINGS Hepatobiliary: 2 gallstones in the dependent portion of the gallbladder, measuring up to 7 mm in maximum diameter each. No gallbladder wall thickening or pericholecystic fluid. Unremarkable liver. Pancreas: Unremarkable. No pancreatic ductal dilatation or surrounding inflammatory changes. Spleen: Normal in size without focal abnormality. Adrenals/Urinary Tract: Dilated urinary bladder, ureters and renal collecting systems. No calculi or masses seen. Stomach/Bowel: Moderate dilatation of the majority of the small bowel with normal caliber distal loops. There is some mesenteric rotation with an appearance suggesting an internal hernia. No bowel wall thickening or pneumatosis. Unremarkable stomach and colon. The appendix is difficult to differentiate from fluid-filled small bowel loops with no gross evidence of appendicitis. Vascular/Lymphatic: Atheromatous arterial calcifications without aneurysm. No enlarged lymph nodes. Reproductive: Moderately enlarged and partially calcified prostate gland.  Other: Proximal right inguinal hernia containing fat. Musculoskeletal: Severe right hip degenerative changes. Mild-to-moderate left hip degenerative changes. Mild lumbar spine degenerative changes. IMPRESSION: 1. Complete or high-grade partial small bowel obstruction with findings suggesting that this is due to an internal hernia. 2. Interval multiple focal patchy and nodular opacities in both upper lobes, compatible with multifocal pneumonia. 3. Minimal left lower lobe pneumonia and mild to moderate right lower lobe atelectasis/pneumonia. 4. Dilated urinary bladder with associated moderate bilateral hydroureter and mild to moderate bilateral hydronephrosis. The dilated bladder may be due to bladder outlet obstruction by the patient's moderately enlarged prostate gland. 5. Decreased size of a moderate-sized loculated right pleural effusion. 6.  Calcific coronary artery and aortic atherosclerosis. 7. Extensive bilateral bullous emphysema. 8. Cholelithiasis. 9. Severe right hip degenerative changes. Aortic Atherosclerosis (ICD10-I70.0) and Emphysema (ICD10-J43.9). Electronically Signed   By: Beckie Salts M.D.   On: 03-06-18 13:33  EKG: Independently reviewed.  NSR with rate 89; LVH; nonspecific ST changes with no evidence of acute ischemia   Labs on Admission: I have personally reviewed the available labs and imaging studies at the time of the admission.  Pertinent labs:   CO2 14 Glucose 113 BUN 87/Creatinine 4.19/GFR 16 Albumin 2.1 Lactate 3.03, 2.5 WBC 28.0 Hgb 11.8 Blood cultures pending  Assessment/Plan Principal Problem:   SBO (small bowel obstruction) (HCC) Active Problems:   History of substance abuse (HCC)   HIV (+) Human immunodeficiency virus disease (HCC)   Tobacco abuse   Acute renal failure (ARF) (HCC)   Sepsis (HCC)   Metabolic acidosis   SBO -Patient with prior h/o abdominal surgery (inguinal hernia repair) presenting with acute onset of abdominal pain with n/v,  abdominal distention, and CT findings c/w SBO - complete or high-grade partial obstruction likely related to an internal hernia -Gen Surg consulted by ER and is taking him to the OR now -Patient appears likely to return intubated from the OR and so PCCM is being consulted -NPO for bowel rest -NG tube in place -IVF hydration  Acute renal failure due to obstructive uropathy and probable UTI -Dr. Ronne Binning had some difficulty placing the foley, and the nurses had been unsuccessful -He does have a large prostate on imaging with moderate B hydroureter and mild to moderate B hydronephrosis and so likely has a component of obstructive uropathy -There was also a large amount of pus present upon placement of the foley and he very likely has an infectious component to his urinary retention -He will need ongoing antibiotics for this issue -UA and culture have been ordered -Dr. Ronne Binning recommends continued foley placement until outpatient f/u appointment with him  Sepsis -He appears to have multifocal PNA on CT and previously was admitted for lung abscess and empyema -He also has an apparent urinary source for his infection -However, his tachycardia, tachypnea, and leukocytosis could also be related to his impressive SBO -Regardless, he is being admitted with plan for broad-spectrum antibiotics and is likely to need ICU care post-operatively  Metabolic acidosis -Likely related to bowel obstruction and/or lactic acidosis in the setting of sepsis  HIV -Reports absolute compliance with Biktarvy -He is likely to need ID consultation during this hospitalization  H/o substance abuse -Reports no further ETOH or substances since last hospitalization -His mother reported ongoing ETOH use -Will check UDS  Tobacco dependence -Requests patch -Reports quitting 1 week ago  Patient is being admitted by the surgery service and is likely to require ICU care after surgery.  [facility] will sign off on the patient at  this time and is happy to resume care for him after he is discharged from the ICU.   Jonah Blue MD [facility]  If note is complete, please contact covering daytime or nighttime physician. [email or web address] Password [email or web address]  03/05/18, 5:46 PM

## 2018-02-23 NOTE — Consult Note (Signed)
    Kevin Jimenez 10/15/1950  [id number].    Requesting MD: Dr. Melanie Belft Chief Complaint/Reason for Consult: SBO  HPI:  This is a 67 yo black male with multiple medical problems including COPD, HIV, Hepatitis C, polysubstance abuse, HTN, and recent admission for multifocal PNA with empyema for which he had a chest tube placed.  This improved and his CT was removed.  He was recently discharged on oral abx therapy.  He is still having a cough and 4 days ago he began having worsening abdominal pain and diarrhea earlier this week.  He has been having some nausea and vomiting.  He last passed gas this morning and last BM was this afternoon.  He was brought to the ED today due to persistent abdominal pain that is diffuse.  He states he does feel bloated.  Upon arrival to the ED, he had a CT scan that revealed a small bowel obstruction secondary to internal hernia, as well as persistent bilateral PNA and a right pleural effusion.  He also appears to have a bladder outlet obstruction with bilateral hydroureter.  He does state that he voided since being here.  He is a poor historian and unclear if this is the case.  We have been asked to see the patient for further recommendations.  He does have significant abnormal labs noted.  Please see below for further details.  ROS: Review of Systems  Constitutional: Positive for malaise/fatigue.  HENT: Negative.   Eyes: Negative.   Respiratory: Positive for cough, shortness of breath and wheezing.   Cardiovascular: Negative.   Gastrointestinal: Positive for abdominal pain, nausea and vomiting.  Genitourinary: Negative.   Musculoskeletal: Negative.   Skin: Negative.   Neurological: Negative.   : Please see HPI, otherwise he admits to SOB with ambulation  Family History  Problem Relation Age of Onset  . Hypertension Mother   . Rheum arthritis Mother   . Ovarian cancer Daughter        Deceased at age 34    Past Medical History:  Diagnosis Date    . Arthritis    "mainly right hand" (12/07/2017)  . Chronic hepatitis C (HCC)   . COPD (chronic obstructive pulmonary disease) (HCC)   . History of substance abuse (HCC) 03/31/2015   Alcohol and heroin abuse   . HIV (human immunodeficiency virus infection) (HCC)    "dx'd in the late 1990s"  . Hypertension   . Pneumonia 12/07/2017    Past Surgical History:  Procedure Laterality Date  . INGUINAL HERNIA REPAIR Right     Social History:  reports that he has quit smoking. His smoking use included cigarettes. He has a 4.70 pack-year smoking history. He has never used smokeless tobacco. He reports that he drinks about 6.0 standard drinks of alcohol per week. He reports that he has current or past drug history. Drugs: Heroin, "Crack" cocaine, and Marijuana.  Allergies: No Known Allergies   (Not in a hospital admission)   Physical Exam: Blood pressure (!) 157/90, pulse 89, temperature 97.9 F (36.6 C), temperature source Axillary, resp. rate (!) 35, height 5' 11" (1.803 m), weight 47.6 kg, SpO2 97 %. Physical Exam  Constitutional: He appears distressed.  Frail, chronically ill appearing AA male lying in bed  HENT:  Head: Normocephalic and atraumatic.  Right Ear: External ear normal.  Left Ear: External ear normal.  Nose: Nose normal.  Poor dentition  Eyes: Conjunctivae and EOM are normal. No scleral icterus.  Neck: Normal range of   motion. Neck supple. No tracheal deviation present.  Cardiovascular: Intact distal pulses. Tachycardia present.  Pulses:      Dorsalis pedis pulses are 2+ on the right side, and 2+ on the left side.  Pulmonary/Chest:  Diffuse rhonchi bilaterally  Abdominal: Bowel sounds are normal. He exhibits distension. There is no hepatosplenomegaly. There is generalized tenderness. There is guarding.  Diffuse TTP most significant in the lower abdomen with peritonitis  Musculoskeletal:  Calves soft and nontender without edema  Neurological: He is alert. No cranial  nerve deficit. GCS score is 15.  Skin: Skin is warm and dry. No rash noted.  Nursing note and vitals reviewed.    Results for orders placed or performed during the hospital encounter of 03/07/2018 (from the past 48 hour(s))  Lipase, blood     Status: None   Collection Time: 03/19/2018 10:36 AM  Result Value Ref Range   Lipase 20 11 - 51 U/L    Comment: Performed at [facility], [address], [place], [place] [address]  Comprehensive metabolic panel     Status: Abnormal   Collection Time: 03/04/2018 10:36 AM  Result Value Ref Range   Sodium 132 (L) 135 - 145 mmol/L   Potassium 5.4 (H) 3.5 - 5.1 mmol/L   Chloride 103 98 - 111 mmol/L   CO2 14 (L) 22 - 32 mmol/L   Glucose, Bld 113 (H) 70 - 99 mg/dL   BUN 87 (H) 8 - 23 mg/dL   Creatinine, Ser 4.19 (H) 0.61 - 1.24 mg/dL   Calcium 9.4 8.9 - 10.3 mg/dL   Total Protein 7.2 6.5 - 8.1 g/dL   Albumin 2.1 (L) 3.5 - 5.0 g/dL   AST 31 15 - 41 U/L   ALT 12 0 - 44 U/L   Alkaline Phosphatase 89 38 - 126 U/L   Total Bilirubin 1.6 (H) 0.3 - 1.2 mg/dL   GFR calc non Af Amer 13 (L) >60 mL/min   GFR calc Af Amer 16 (L) >60 mL/min    Comment: (NOTE) The eGFR has been calculated using the CKD EPI equation. This calculation has not been validated in all clinical situations. eGFR's persistently <60 mL/min signify possible Chronic Kidney Disease.    Anion gap 15 5 - 15    Comment: Performed at [facility] [address] [place], [place] [address]  CBC     Status: Abnormal   Collection Time: 03/20/2018 10:36 AM  Result Value Ref Range   WBC 28.0 (H) 4.0 - 10.5 K/uL    Comment: REPEATED TO VERIFY   RBC 3.94 (L) 4.22 - 5.81 MIL/uL   Hemoglobin 11.8 (L) 13.0 - 17.0 g/dL   HCT 36.2 (L) 39.0 - 52.0 %   MCV 91.9 78.0 - 100.0 fL   MCH 29.9 26.0 - 34.0 pg   MCHC 32.6 30.0 - 36.0 g/dL   RDW 15.9 (H) 11.5 - 15.5 %   Platelets 436 (H) 150 - 400 K/uL    Comment: Performed at [facility], [place] [address], [place], [place] [address]  Blood  Culture (routine x 2)     Status: None (Preliminary result)   Collection Time: 03/10/2018 11:58 AM  Result Value Ref Range   Specimen Description BLOOD RIGHT ANTECUBITAL    Special Requests      BOTTLES DRAWN AEROBIC AND ANAEROBIC Blood Culture adequate volume   Culture      NO GROWTH < 12 HOURS Performed at [facility],  [address], [place], [place] [address]    Report Status PENDING   Type and screen [place]     Status: None   Collection Time: 03/17/2018 12:00 PM  Result Value Ref Range   ABO/RH(D) O POS    Antibody Screen NEG    Sample Expiration      02/26/2018 Performed at [facility], [place] [address], [place], [place] [address]   Blood Culture (routine x 2)     Status: None (Preliminary result)   Collection Time: 03/14/2018 12:10 PM  Result Value Ref Range   Specimen Description BLOOD LEFT ANTECUBITAL    Special Requests      BOTTLES DRAWN AEROBIC AND ANAEROBIC Blood Culture adequate volume   Culture      NO GROWTH < 12 HOURS Performed at [facility] [address] [place], [place] [address]    Report Status PENDING   I-Stat CG4 Lactic Acid, ED  (not at  [facility])     Status: Abnormal   Collection Time: 03/11/2018 12:22 PM  Result Value Ref Range   Lactic Acid, Venous 3.03 (HH) 0.5 - 1.9 mmol/L   Comment NOTIFIED PHYSICIAN   I-Stat CG4 Lactic Acid, ED  (not at  [facility])     Status: Abnormal   Collection Time: 03/16/2018  2:52 PM  Result Value Ref Range   Lactic Acid, Venous 2.50 (HH) 0.5 - 1.9 mmol/L   Comment NOTIFIED PHYSICIAN    Ct Abdomen Pelvis Wo Contrast  Result Date: 02/22/2018 CLINICAL DATA:  Abdominal pain, shortness of breath and weakness. Recent pneumonia with an associated effusion and sepsis. EXAM: CT CHEST, ABDOMEN AND PELVIS WITHOUT CONTRAST TECHNIQUE: Multidetector CT imaging of the chest, abdomen and pelvis was performed following the standard protocol without IV contrast. COMPARISON:  Chest radiographs obtained earlier  today. Chest CT dated 12/07/2017. FINDINGS: CT CHEST FINDINGS Cardiovascular: Atheromatous calcifications, including the coronary arteries and aorta. Mediastinum/Nodes: No enlarged mediastinal, hilar, or axillary lymph nodes. Thyroid gland, trachea, and esophagus demonstrate no significant findings. Lungs/Pleura: Moderate-sized loculated right pleural effusion laterally. Minimal non loculated pleural fluid on the right. No pleural fluid on the left. Interval multiple focal patchy and nodular opacities in both upper lobes. Mild patchy opacity and volume loss in the right lower lobe. Minimal interval patchy opacity in the left lower lobe. Extensive bilateral bullous changes. Musculoskeletal: Thoracic and lower cervical spine degenerative changes. CT ABDOMEN PELVIS FINDINGS Hepatobiliary: 2 gallstones in the dependent portion of the gallbladder, measuring up to 7 mm in maximum diameter each. No gallbladder wall thickening or pericholecystic fluid. Unremarkable liver. Pancreas: Unremarkable. No pancreatic ductal dilatation or surrounding inflammatory changes. Spleen: Normal in size without focal abnormality. Adrenals/Urinary Tract: Dilated urinary bladder, ureters and renal collecting systems. No calculi or masses seen. Stomach/Bowel: Moderate dilatation of the majority of the small bowel with normal caliber distal loops. There is some mesenteric rotation with an appearance suggesting an internal hernia. No bowel wall thickening or pneumatosis. Unremarkable stomach and colon. The appendix is difficult to differentiate from fluid-filled small bowel loops with no gross evidence of appendicitis. Vascular/Lymphatic: Atheromatous arterial calcifications without aneurysm. No enlarged lymph nodes. Reproductive: Moderately enlarged and partially calcified prostate gland. Other: Proximal right inguinal hernia containing fat. Musculoskeletal: Severe right hip degenerative changes. Mild-to-moderate left hip degenerative changes.  Mild lumbar spine degenerative changes. IMPRESSION: 1. Complete or high-grade partial small bowel obstruction with findings suggesting that this is due to an internal hernia. 2. Interval multiple  focal patchy and nodular opacities in both upper lobes, compatible with multifocal pneumonia. 3. Minimal left lower lobe pneumonia and mild to moderate right lower lobe atelectasis/pneumonia. 4. Dilated urinary bladder with associated moderate bilateral hydroureter and mild to moderate bilateral hydronephrosis. The dilated bladder may be due to bladder outlet obstruction by the patient's moderately enlarged prostate gland. 5. Decreased size of a moderate-sized loculated right pleural effusion. 6.  Calcific coronary artery and aortic atherosclerosis. 7. Extensive bilateral bullous emphysema. 8. Cholelithiasis. 9. Severe right hip degenerative changes. Aortic Atherosclerosis (ICD10-I70.0) and Emphysema (ICD10-J43.9). Electronically Signed   By: Steven  Reid M.D.   On: 03/14/2018 13:33   Dg Chest 2 View  Result Date: 03/06/2018 CLINICAL DATA:  Acute shortness of breath. EXAM: CHEST - 2 VIEW COMPARISON:  12/25/2017 and prior radiographs FINDINGS: The cardiomediastinal silhouette is unchanged. RIGHT pleural effusion which appears loculated appears decreased in size from the prior study. RIGHT basilar atelectasis/scarring noted. No pneumothorax or new pulmonary opacities identified. No acute bony abnormalities are noted. IMPRESSION: Decreased RIGHT pleural effusion which appears loculated. Associated RIGHT basilar atelectasis/scarring. Electronically Signed   By: Jeffrey  Hu M.D.   On: 02/24/2018 12:58   Ct Chest Wo Contrast  Result Date: 03/07/2018 CLINICAL DATA:  Abdominal pain, shortness of breath and weakness. Recent pneumonia with an associated effusion and sepsis. EXAM: CT CHEST, ABDOMEN AND PELVIS WITHOUT CONTRAST TECHNIQUE: Multidetector CT imaging of the chest, abdomen and pelvis was performed following the  standard protocol without IV contrast. COMPARISON:  Chest radiographs obtained earlier today. Chest CT dated 12/07/2017. FINDINGS: CT CHEST FINDINGS Cardiovascular: Atheromatous calcifications, including the coronary arteries and aorta. Mediastinum/Nodes: No enlarged mediastinal, hilar, or axillary lymph nodes. Thyroid gland, trachea, and esophagus demonstrate no significant findings. Lungs/Pleura: Moderate-sized loculated right pleural effusion laterally. Minimal non loculated pleural fluid on the right. No pleural fluid on the left. Interval multiple focal patchy and nodular opacities in both upper lobes. Mild patchy opacity and volume loss in the right lower lobe. Minimal interval patchy opacity in the left lower lobe. Extensive bilateral bullous changes. Musculoskeletal: Thoracic and lower cervical spine degenerative changes. CT ABDOMEN PELVIS FINDINGS Hepatobiliary: 2 gallstones in the dependent portion of the gallbladder, measuring up to 7 mm in maximum diameter each. No gallbladder wall thickening or pericholecystic fluid. Unremarkable liver. Pancreas: Unremarkable. No pancreatic ductal dilatation or surrounding inflammatory changes. Spleen: Normal in size without focal abnormality. Adrenals/Urinary Tract: Dilated urinary bladder, ureters and renal collecting systems. No calculi or masses seen. Stomach/Bowel: Moderate dilatation of the majority of the small bowel with normal caliber distal loops. There is some mesenteric rotation with an appearance suggesting an internal hernia. No bowel wall thickening or pneumatosis. Unremarkable stomach and colon. The appendix is difficult to differentiate from fluid-filled small bowel loops with no gross evidence of appendicitis. Vascular/Lymphatic: Atheromatous arterial calcifications without aneurysm. No enlarged lymph nodes. Reproductive: Moderately enlarged and partially calcified prostate gland. Other: Proximal right inguinal hernia containing fat. Musculoskeletal:  Severe right hip degenerative changes. Mild-to-moderate left hip degenerative changes. Mild lumbar spine degenerative changes. IMPRESSION: 1. Complete or high-grade partial small bowel obstruction with findings suggesting that this is due to an internal hernia. 2. Interval multiple focal patchy and nodular opacities in both upper lobes, compatible with multifocal pneumonia. 3. Minimal left lower lobe pneumonia and mild to moderate right lower lobe atelectasis/pneumonia. 4. Dilated urinary bladder with associated moderate bilateral hydroureter and mild to moderate bilateral hydronephrosis. The dilated bladder may be due to bladder outlet obstruction by the   patient's moderately enlarged prostate gland. 5. Decreased size of a moderate-sized loculated right pleural effusion. 6.  Calcific coronary artery and aortic atherosclerosis. 7. Extensive bilateral bullous emphysema. 8. Cholelithiasis. 9. Severe right hip degenerative changes. Aortic Atherosclerosis (ICD10-I70.0) and Emphysema (ICD10-J43.9). Electronically Signed   By: Claudie Revering M.D.   On: 02/24/2018 13:33   Anti-infectives (From admission, onward)   Start     Dose/Rate Route Frequency Ordered Stop   03/14/2018 1200  vancomycin (VANCOCIN) IVPB 1000 mg/200 mL premix     1,000 mg 200 mL/hr over 60 Minutes Intravenous  Once 03/02/2018 1158 03/12/2018 1416   03/14/2018 1200  ceFEPIme (MAXIPIME) 2 g in sodium chloride 0.9 % 100 mL IVPB     2 g 200 mL/hr over 30 Minutes Intravenous  Once 02/28/2018 1158 02/21/2018 1323        Assessment/Plan COPD Tobacco abuse HIV - noncompliant with medications Hepatitis C H/o polysubstance abuse HTN CHF - EF 30-35% 12/10/2017 Paroxysmal atrial fibrillation not on anticoagulation Recent admission for multifocal PNA with empyema Urinary retention with moderate bilateral hydroureter/hydronephrosis - urology has been consulted Acute renal failure Lactic acidosis  SBO secondary to internal hernia - Patient found to have  SBO secondary to internal hernia and concern for peritonitis on exam. Recommend exploratory laparotomy, possible bowel resection. Patient is chronically and acutely very ill. Long discussion held with patient and his mother regarding the risks and complications with surgery, and they wish to proceed. Discussed the fact that he likely will not be able to come off the ventilator postoperatively and will have to go to the ICU. CCM has been called for admission.  FEN - IVF, NPO/NGT ID - cefepime/vancomcyin 10/2>>

## 2018-02-23 NOTE — Anesthesia Preprocedure Evaluation (Addendum)
Anesthesia Evaluation  Patient identified by MRN, date of birth, ID band Patient awake    Reviewed: Allergy & Precautions, NPO status , Patient's Chart, lab work & pertinent test results  Airway Mallampati: II  TM Distance: >3 FB Neck ROM: Full    Dental  (+) Dental Advisory Given   Pulmonary pneumonia, COPD, former smoker,    + rhonchi        Cardiovascular hypertension, Pt. on medications and Pt. on home beta blockers  Rhythm:Regular Rate:Normal     Neuro/Psych negative neurological ROS     GI/Hepatic (+)     substance abuse  , Hepatitis -, CSBO   Endo/Other  negative endocrine ROS  Renal/GU ARFRenal disease     Musculoskeletal  (+) Arthritis ,   Abdominal   Peds  Hematology  (+) HIV,   Anesthesia Other Findings   Reproductive/Obstetrics                           Lab Results  Component Value Date   WBC 28.0 (H) Mar 14, 2018   HGB 11.8 (L) 2018-03-14   HCT 36.2 (L) 2018/03/14   MCV 91.9 03/14/18   PLT 436 (H) 03-14-2018   Lab Results  Component Value Date   CREATININE 4.19 (H) 2018/03/14   BUN 87 (H) 03-14-2018   NA 132 (L) 03/14/2018   K 5.4 (H) 14-Mar-2018   CL 103 March 14, 2018   CO2 14 (L) March 14, 2018    Anesthesia Physical Anesthesia Plan  ASA: IV and emergent  Anesthesia Plan: General   Post-op Pain Management:    Induction: Intravenous, Rapid sequence and Cricoid pressure planned  PONV Risk Score and Plan: 2 and Ondansetron, Dexamethasone and Treatment may vary due to age or medical condition  Airway Management Planned: Oral ETT  Additional Equipment: Arterial line  Intra-op Plan:   Post-operative Plan: Post-operative intubation/ventilation  Informed Consent: I have reviewed the patients History and Physical, chart, labs and discussed the procedure including the risks, benefits and alternatives for the proposed anesthesia with the patient or authorized  representative who has indicated his/her understanding and acceptance.   Dental advisory given  Plan Discussed with: CRNA  Anesthesia Plan Comments:        Anesthesia Quick Evaluation

## 2018-02-23 NOTE — Anesthesia Procedure Notes (Signed)
Arterial Line Insertion Start/End10/06/2017 6:12 PM, 02/28/2018 6:20 PM Performed by: CRNA  Patient location: OOR procedure area. Preanesthetic checklist: patient identified, IV checked, site marked, risks and benefits discussed, surgical consent, monitors and equipment checked, pre-op evaluation and anesthesia consent Right, radial was placed Catheter size: 20 G Hand hygiene performed , maximum sterile barriers used  and Seldinger technique used Allen's test indicative of satisfactory collateral circulation Attempts: 2 Procedure performed without using ultrasound guided technique. Ultrasound Notes:anatomy identified, needle tip was noted to be adjacent to the nerve/plexus identified and no ultrasound evidence of intravascular and/or intraneural injection Following insertion, Biopatch. Post procedure assessment: normal

## 2018-02-23 NOTE — ED Notes (Signed)
Pt states he is unable to provide a UA sample because he just went to the bathroom.

## 2018-02-23 NOTE — ED Notes (Signed)
Attempt 16 F foley cath, unsuccessful. Tried coude cath, unsuccessful. MD made aware, will consult urology.

## 2018-02-23 NOTE — Transfer of Care (Signed)
Immediate Anesthesia Transfer of Care Note  Patient: Kevin Jimenez  Procedure(s) Performed: EXPLORATORY LAPAROTOMY (N/A Abdomen)  Patient Location: ICU  Anesthesia Type:General  Level of Consciousness: sedated and Patient remains intubated per anesthesia plan  Airway & Oxygen Therapy: Patient remains intubated per anesthesia plan and Patient placed on Ventilator (see vital sign flow sheet for setting)  Post-op Assessment: Report given to RN and Post -op Vital signs reviewed and stable  Post vital signs: Reviewed and stable  Last Vitals:  Vitals Value Taken Time  BP 121/77 2018-03-16  7:49 PM  Temp    Pulse    Resp 19 03/16/2018  7:50 PM  SpO2    Vitals shown include unvalidated device data.  Last Pain:  Vitals:   March 16, 2018 1025  TempSrc: Axillary  PainSc: 8       Patients Stated Pain Goal: 0 (2018-03-16 1025)  Complications: No apparent anesthesia complications

## 2018-02-23 NOTE — Op Note (Signed)
2018/03/23  7:23 PM  PATIENT:  Kevin Jimenez  67 y.o. male  PRE-OPERATIVE DIAGNOSIS:  peritonitis  POST-OPERATIVE DIAGNOSIS:  Perforated appendicitis with intra-abdominal abcsess and small bowel obstruction.  PROCEDURE:  Procedure(s): EXPLORATORY LAPAROTOMY APPENDECTOMY DRAINAGE OF INTRA-ABDOMINAL ABSCESS LYSIS OF ADHESIONS WITH RELEASE OF SMALL BOWEL OBSTRUCTION  SURGEON: Violeta Gelinas, MD  ASSISTANTS: None  ANESTHESIA:   local and general  EBL:  Total I/O In: -  Out: 40 [Urine:30; Blood:10]  BLOOD ADMINISTERED:none  DRAINS: (1) Jackson-Pratt drain(s) with closed bulb suction in the rlq   SPECIMEN:  Excision  DISPOSITION OF SPECIMEN:  PATHOLOGY  COUNTS:  YES  DICTATION: .Dragon Dictation Findings: Perforated appendicitis with intra-abdominal abscess, small bowel stuck in right lower quadrant and twisted.  Diffuse peritonitis.  Procedure in detail: Kevin Jimenez presents for exploratory laparotomy for peritonitis.  Informed consent was obtained.  He received intravenous antibiotics.  He was brought to the operating room and general endotracheal anesthesia was administered by the anesthesia staff.  His abdomen was prepped and draped in sterile fashion.  We did a timeout procedure.  Midline incision was made.  Subtenons tissues were dissected down revealing the anterior fascia which was divided along the midline.  The peritoneal cavity was entered carefully under direct vision and open to the length of the incision.  There was evidence of fibrinous exudate and dilated small bowel.  The small bowel was gradually delivered into the wound.  The abdomen was explored revealing the small bowel to be tethered down to the right lower quadrant and obstructed.  These adhesions were gently taken down using blunt dissection.  This revealed the lead point of the obstruction to be inflammatory process in the right lower quadrant consistent with perforated appendicitis.  The appendix was  inflamed with a small perforation.  There was diffuse fibrinous exudate around the small bowel and abscess in the pelvis.  Cultures were sent from the pelvic abscess fluid.  The meso- appendix was taken down with Kelly clamps and those were securely ligated with silk sutures.  The base of the appendix was clamped and divided.  The appendix was sent to pathology.  The appendiceal stump was tied securely with 2-0 silk and then the stump was buried with multiple interrupted 2-0 silk sutures.  The abdomen was copiously irrigated.  Nasogastric tube was confirmed to be in good position.  The small bowel was run again from the ligament of Treitz to the terminal ileum and the obstruction was confirmed to be relieved.  All the small bowel was viable.  Right colon, transverse colon, left and sigmoid colon appeared normal.  The gallbladder was distended but appeared normal.  The stomach and duodenum were normal.  19 [place] Blake drain was placed in the pelvis and brought out the right lower quadrant.  It was secured with nylon.  Bowel was returned to anatomic position.  The omentum was placed over the bowel and the fascia was closed with running #1 looped PDS.  Wound was left open and packed with saline soaked gauze.  All counts were correct.  He tolerated the procedure well.  There were no apparent complications but he remained in critical condition and was taken directly to the intensive care unit on the ventilator. PATIENT DISPOSITION:  ICU - intubated and critically ill.   Delay start of Pharmacological VTE agent (>24hrs) due to surgical blood loss or risk of bleeding:  no  Violeta Gelinas, MD, MPH, FACS Pager: [phone]  Oct 31, 20197:23 PM

## 2018-02-23 NOTE — Progress Notes (Signed)
Patient ID: Kevin Jimenez, male   DOB: 1950-11-11, 67 y.o.   MRN: [id number] Agree with the assessment and plan by Dr. Corliss Skains.  Patient with signs of bowel obstruction and peritonitis.  He is extremely critically ill.  I discussed the procedure, risks, benefits, the fact that he will remain on the ventilator postoperatively and the grave nature of his multifactorial illness with him and his mother.  He is agreeable with proceeding with surgery.  Violeta Gelinas, MD, MPH, FACS Trauma: [phone] General Surgery: [phone]

## 2018-02-23 NOTE — ED Triage Notes (Signed)
Pt comes to day for ABD pain ,Shob, weakness.started  3-4 days ago.Pt here with family.

## 2018-02-23 NOTE — Consult Note (Signed)
NAME:  Kevin Jimenez, MRN:  [id number], DOB:  24-Dec-1950, LOS: 0 ADMISSION DATE:  02/22/2018, CONSULTATION DATE:  03/13/2018 REFERRING MD:  Corliss Skains CHIEF COMPLAINT:  Abd pain   Brief History   Kevin Jimenez is a 67 y.o. male who was admitted 10/3 with SBO and perforated appendicitis.  He was taken to the OR where he had ex-lap, appendectomy, drainage of intraabdominal abscess, lysis of adhesions with release of SBO. Post operatively, he remained ventilated; therefore, PCCM was asked to assist with vent management.  Significant Hospital Events   10/3 > admit.  Consults: date of consult/date signed off & final recs:  Surgery 10/3 > admit. PCCM 10/3  Procedures (surgical and bedside):  ETT 10/3 > Radial art line 10/3 >  RLQ JP drain 10/3 >   Significant Diagnostic Tests:  CT chest / abd / pelv 10/3 > complete or high grade SBO likely due to an internal hernia, multifocal PNA, dilated bladder with mod bilateral hydroureter and mild to mod bilateral hydronephrosis, small loculated right pleural effusion, cholelithiasis.  Micro Data: Blood 10/3 >  Urine 10/3 >  Abd fluid 10/3 >   Antimicrobials:  Vanc 10/3 > Zosyn 10/3 >  Subjective:  Comfortable on vent.  Objective   Blood pressure (!) 146/92, pulse 99, temperature 97.9 F (36.6 C), temperature source Axillary, resp. rate (!) 28, height 5\' 11"  (1.803 m), weight 47.6 kg, SpO2 99 %.        Intake/Output Summary (Last 24 hours) at 03/06/2018 1907 Last data filed at 03/18/2018 1845 Gross per 24 hour  Intake 500 ml  Output 150 ml  Net 350 ml   Filed Weights   02/22/2018 1025  Weight: 47.6 kg    Examination: General: Chronically ill appearing male, resting in bed, in NAD. Neuro: Sedated, non-responsive. HEENT: [place]/AT. Sclerae anicteric.  MMM.  ETT in place. Cardiovascular: RRR, no M/R/G.  Lungs: Respirations even and unlabored.  Coarse in left base, otherwise clear. Abdomen: Abdominal dressings C/D/I, RLQ JP drain, soft,  NT/ND.  Musculoskeletal: No gross deformities, no edema.  Skin: Intact, warm, no rashes.  Assessment & Plan:   SBO with peritonitis and perforated appendicitis - s/p ex-lap, appendectomy, drainage of intraabdominal abscess, lysis of adhesions with release of SBO. - Post op care per surgery. - If not improving on vanc / zosyn, consider addition anidulafungin.  Respiratory insufficiency - s/p intubation prior to OR. - Full vent support. - Assess ABG.  - Bronchial hygiene. - Follow CXR.  Sedation needs due to mechanical ventilation. - Fentanyl gtt / Midazolam PRN. - RASS goal: -1. - Daily WUA.  Multifocal PNA. Hx recent PNA with empyema (all cultures negative). - Continue empiric abx and follow cultures.  Sepsis - due to above. - Continue abx (vanc / zosyn), fluids, supportive care. - Repeat lactate.  AKI - Presumed post-renal from bladder outlet obstruction. AGMA - due to renal failure + lactate. - Continue fluids. - Repeat lactate. - Follow BMP. - Might need nephrology consult if not improved (as well as urology as mentioned below).  Hyponatremia - suspect hypovolemic. - Continue fluids.  Obstructive uropathy with bilateral hydroureter and hydronephrosis - reportedly seen by urology in ED but no notes in system yet. Hematuria - Day team to please consult urology. - Foley to remain in place until outpatient f/u with urology.  Hx HIV, HCV. - Hold preadmission Biktarvy. - Assess CD4 count, viral load, HCV quant. - Day team to please notify ID of admission.  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 - Thiamine, Folate. - Cessation counseling.  Hx sCHF (Echo from 12/10/17 with EF 30 - 35%), HTN. - Hold preadmission diltiazem, furosemide, lopressor.  Disposition / Summary of Today's Plan 03/09/2018   Remain in ICu under [facility] service. Continue full vent support overnight. Continue supportive care, broad abx, follow cultures.    Diet:  NPO. Pain/Anxiety/Delirium protocol (if indicated): fentanyl gtt / midazolam PRN. VAP protocol (if indicated): In place. DVT prophylaxis: SCD's. GI prophylaxis: Famotidine. Hyperglycemia protocol: N/A. Mobility: Bedrest. Code Status: Full - NOTE, on prior admit 7/17 pt was listed as DNR.  Attempted to reach pt's mother tonight to clarify code status; however, no answer. Family Communication: Attempted to reach pt's mother over the phone but no answer.  Labs   CBC: Recent Labs  Lab 03/14/2018 1036  WBC 28.0*  HGB 11.8*  HCT 36.2*  MCV 91.9  PLT 436*   Basic Metabolic Panel: Recent Labs  Lab 03/03/2018 1036  NA 132*  K 5.4*  CL 103  CO2 14*  GLUCOSE 113*  BUN 87*  CREATININE 4.19*  CALCIUM 9.4   GFR: Estimated Creatinine Clearance: 11.5 mL/min (A) (by C-G formula based on SCr of 4.19 mg/dL (H)). Recent Labs  Lab 03/01/2018 1036 03/21/2018 1222 03/12/2018 1452  WBC 28.0*  --   --   LATICACIDVEN  --  3.03* 2.50*   Liver Function Tests: Recent Labs  Lab 02/26/2018 1036  AST 31  ALT 12  ALKPHOS 89  BILITOT 1.6*  PROT 7.2  ALBUMIN 2.1*   Recent Labs  Lab 02/26/2018 1036  LIPASE 20   No results for input(s): AMMONIA in the last 168 hours. ABG    Component Value Date/Time   PHART 7.405 12/10/2017 0610   PCO2ART 31.0 (L) 12/10/2017 0610   PO2ART 93.1 12/10/2017 0610   HCO3 19.0 (L) 12/10/2017 0610   ACIDBASEDEF 4.9 (H) 12/10/2017 0610   O2SAT 96.2 12/10/2017 0610    Coagulation Profile: No results for input(s): INR, PROTIME in the last 168 hours. Cardiac Enzymes: No results for input(s): CKTOTAL, CKMB, CKMBINDEX, TROPONINI in the last 168 hours. HbA1C: No results found for: HGBA1C CBG: No results for input(s): GLUCAP in the last 168 hours.  Admitting History of Present Illness.   Pt is encephelopathic; therefore, this HPI is obtained from chart review. Kevin Jimenez is a 67 y.o. male who has multiple medical problems as outlined below.  Had had recent  admission 7/17 through 8/6 for PNA and empyema hich required chest tube placement.  He presented to [facility] ED on 03/07/2018 with abdominal pain.  He was found to have SBO with peritonitis.  He was seen by surgery and was taken to the OR evening of 10/3 for ex lap with appendectomy (found to have perforated appendicitis), drainage of intraabdominal abscess, lysis of adhesions with release of SBO.  Post operatively, he remained on the ventilator; therefore, PCCM was consulted for vent management.  Review of Systems:   Unable to obtain as pt is encephalopathic.  Past medical history  He,  has a past medical history of Arthritis, Chronic hepatitis C (HCC), COPD (chronic obstructive pulmonary disease) (HCC), History of substance abuse (HCC) (03/31/2015), HIV (human immunodeficiency virus infection) (HCC), Hypertension, and Pneumonia (12/07/2017).   Surgical History    Past Surgical History:  Procedure Laterality Date  . INGUINAL HERNIA REPAIR Right      Social History   Social History   Socioeconomic History  . Marital  status: Single    Spouse name: Not on file  . Number of children: Not on file  . Years of education: Not on file  . Highest education level: Not on file  Occupational History  . Not on file  Social Needs  . Financial resource strain: Not on file  . Food insecurity:    Worry: Not on file    Inability: Not on file  . Transportation needs:    Medical: Not on file    Non-medical: Not on file  Tobacco Use  . Smoking status: Former Smoker    Packs/day: 0.10    Years: 47.00    Pack years: 4.70    Types: Cigarettes  . Smokeless tobacco: Never Used  Substance and Sexual Activity  . Alcohol use: Yes    Alcohol/week: 6.0 standard drinks    Types: 6 Cans of beer per week  . Drug use: Not Currently    Types: Heroin, "Crack" cocaine, Marijuana    Comment: 12/07/2017 "haven't used any drugs in the 2000s"  . Sexual activity: Not Currently    Partners: Female    Birth  control/protection: Condom  Lifestyle  . Physical activity:    Days per week: Not on file    Minutes per session: Not on file  . Stress: Not on file  Relationships  . Social connections:    Talks on phone: Not on file    Gets together: Not on file    Attends religious service: Not on file    Active member of club or organization: Not on file    Attends meetings of clubs or organizations: Not on file    Relationship status: Not on file  . Intimate partner violence:    Fear of current or ex partner: Not on file    Emotionally abused: Not on file    Physically abused: Not on file    Forced sexual activity: Not on file  Other Topics Concern  . Not on file  Social History Narrative  . Not on file  ,  reports that he has quit smoking. His smoking use included cigarettes. He has a 4.70 pack-year smoking history. He has never used smokeless tobacco. He reports that he drinks about 6.0 standard drinks of alcohol per week. He reports that he has current or past drug history. Drugs: Heroin, "Crack" cocaine, and Marijuana.   Family history   His family history includes Hypertension in his mother; Ovarian cancer in his daughter; Rheum arthritis in his mother.   Allergies No Known Allergies  Home meds  Prior to Admission medications   Medication Sig Start Date End Date Taking? Authorizing Provider  metoprolol tartrate (LOPRESSOR) 25 MG tablet Take 1 tablet (25 mg total) by mouth 2 (two) times daily. 12/26/17  Yes Alwyn Ren, MD  amoxicillin-clavulanate (AUGMENTIN) 875-125 MG tablet Take 1 tablet by mouth every 12 (twelve) hours. Patient not taking: Reported on 02/26/2018 12/26/17   Alwyn Ren, MD  bictegravir-emtricitabine-tenofovir AF (BIKTARVY) 50-200-25 MG TABS tablet Take 1 tablet by mouth daily. Patient not taking: Reported on 26-Feb-2018 12/27/17   Alwyn Ren, MD  ciprofloxacin (CIPRO) 250 MG tablet Take 250 mg by mouth every 12 (twelve) hours. 01/31/18   [provider]  diltiazem (CARDIZEM CD) 120 MG 24 hr capsule Take 1 capsule (120 mg total) by mouth daily. 12/27/17   Alwyn Ren, MD  folic acid (FOLVITE) 1 MG tablet Take 1 tablet (1 mg total) by mouth daily. 12/27/17  Alwyn Ren, MD  furosemide (LASIX) 20 MG tablet Take 1 tablet (20 mg total) by mouth daily. Patient taking differently: Take 20 mg by mouth daily as needed (for swelling of the legs).  12/26/17 12/26/18  Alwyn Ren, MD  Hydrocortisone (GERHARDT'S BUTT CREAM) CREA Apply 1 application topically 2 (two) times daily. 12/26/17   Alwyn Ren, MD  hydrOXYzine (ATARAX/VISTARIL) 25 MG tablet Take 25 mg by mouth at bedtime as needed for sleep. 01/31/18   [provider]  magnesium oxide (MAG-OX) 400 (241.3 Mg) MG tablet Take 1 tablet (400 mg total) by mouth 2 (two) times daily. 12/26/17   Alwyn Ren, MD  Multiple Vitamin (MULTIVITAMIN WITH MINERALS) TABS tablet Take 1 tablet by mouth daily. 12/26/17   Alwyn Ren, MD  nicotine (NICODERM CQ - DOSED IN MG/24 HOURS) 14 mg/24hr patch Place 1 patch (14 mg total) onto the skin daily. 12/27/17   Alwyn Ren, MD  potassium chloride SA (K-DUR,KLOR-CON) 20 MEQ tablet Take 1 tablet (20 mEq total) by mouth 2 (two) times daily. 12/26/17   Alwyn Ren, MD  thiamine 100 MG tablet Take 1 tablet (100 mg total) by mouth daily. 12/27/17   Alwyn Ren, MD  VENTOLIN HFA [phone] Base) MCG/ACT inhaler Take 2 puffs 4 (four) times daily as needed by mouth for shortness of breath. 02/03/17   [provider]   CC time: 35 min.   Rutherford Guys, [place] [facility] Pager: [phone]  or [phone] 2018/03/24, 7:07 PM

## 2018-02-23 NOTE — ED Provider Notes (Signed)
[facility] EMERGENCY DEPARTMENT Provider Note   CSN: [id number] Arrival date & time: 03/07/18  1012     History   Chief Complaint No chief complaint on file.   HPI Kevin Jimenez is a 67 y.o. male.  HPI  Patient is a 67yo male with HIV, chronic hepatitis C, HTN, COPD, and polysubstance use (heroin and tobaacco use) who presents with worsening constant diffuse abdominal pain and distension x 3 days.  Patient complains of associated N/V/D, fatigue, SOB, and unchanged cough.  No fevers reported.  Last BM this afternoon.  He complains of urgency, frequency, and dysuria however denies hematuria.   No alleviating or aggravating factors.  Per chart review patient was recently admitted with discharge on 12/26/2017 for severe sepsis 2/2 right sided pneumonia with parapneumonic effusion and empyema that required a chest tube placement.  Completed a course of ABX.   Past Medical History:  Diagnosis Date  . Arthritis    "mainly right hand" (12/07/2017)  . Chronic hepatitis C (HCC)   . COPD (chronic obstructive pulmonary disease) (HCC)   . History of substance abuse (HCC) 03/31/2015   Alcohol and heroin abuse   . HIV (human immunodeficiency virus infection) (HCC)    "dx'd in the late 1990s"  . Hypertension   . Pneumonia 12/07/2017    Patient Active Problem List   Diagnosis Date Noted  . Tachycardia 12/18/2017  . Pressure injury of skin 12/17/2017  . Protein-calorie malnutrition, severe 12/09/2017  . Sepsis due to pneumonia (HCC) 12/07/2017  . Hyponatremia 12/07/2017  . Acute kidney injury (HCC) 12/07/2017  . Tobacco abuse 01/22/2016  . Screening examination for venereal disease 01/21/2016  . Abnormal drug screen 08/30/2015  . At risk for abuse of opiates 08/30/2015  . Misuse of prescription only drugs (HCC) 08/30/2015  . Long term current use of opiate analgesic 08/04/2015  . Long term prescription opiate use 08/04/2015  . Encounter for therapeutic drug level  monitoring 08/04/2015  . Osteoarthritis of hip (Location of Primary Source of Pain) (Right) 08/04/2015  . Liver fibrosis 07/15/2015  . HIV (+) Human immunodeficiency virus disease (HCC) 04/15/2015  . Chronic hepatitis C without hepatic coma (HCC) 04/15/2015  . Chronic hip pain (Location of Primary Source of Pain) (Right) 04/08/2015  . History of substance abuse (HCC) 03/31/2015  . 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 INGUINAL HERNIA REPAIR Right         Home Medications    Prior to Admission medications   Medication Sig Start Date End Date Taking? Authorizing Provider  amoxicillin-clavulanate (AUGMENTIN) 875-125 MG tablet Take 1 tablet by mouth every 12 (twelve) hours. 12/26/17   Alwyn Ren, MD  bictegravir-emtricitabine-tenofovir AF (BIKTARVY) 50-200-25 MG TABS tablet Take 1 tablet by mouth daily. 12/27/17   Alwyn Ren, MD  diltiazem (CARDIZEM CD) 120 MG 24 hr capsule Take 1 capsule (120 mg total) by mouth daily. 12/27/17   Alwyn Ren, MD  folic acid (FOLVITE) 1 MG tablet Take 1 tablet (1 mg total) by mouth daily. 12/27/17   Alwyn Ren, MD  furosemide (LASIX) 20 MG tablet Take 1 tablet (20 mg total) by mouth daily. 12/26/17 12/26/18  Alwyn Ren, MD  Hydrocortisone (GERHARDT'S BUTT CREAM) CREA Apply 1 application topically 2 (two) times daily. 12/26/17   Alwyn Ren, MD  magnesium oxide (MAG-OX) 400 (241.3 Mg) MG tablet Take 1 tablet (400 mg total) by mouth  2 (two) times daily. 12/26/17   Alwyn Ren, MD  metoprolol tartrate (LOPRESSOR) 25 MG tablet Take 1 tablet (25 mg total) by mouth 2 (two) times daily. 12/26/17   Alwyn Ren, MD  Multiple Vitamin (MULTIVITAMIN WITH MINERALS) TABS tablet Take 1 tablet by mouth daily. 12/26/17   Alwyn Ren, MD  nicotine (NICODERM CQ - DOSED IN MG/24 HOURS) 14 mg/24hr patch Place 1 patch (14 mg total) onto the skin daily.  12/27/17   Alwyn Ren, MD  potassium chloride SA (K-DUR,KLOR-CON) 20 MEQ tablet Take 1 tablet (20 mEq total) by mouth 2 (two) times daily. 12/26/17   Alwyn Ren, MD  thiamine 100 MG tablet Take 1 tablet (100 mg total) by mouth daily. 12/27/17   Alwyn Ren, MD  VENTOLIN HFA 108 [phone] Base) MCG/ACT inhaler Take 2 puffs 4 (four) times daily as needed by mouth for shortness of breath. 02/03/17   [provider]    Family History Family History  Problem Relation Age of Onset  . Hypertension Mother   . Rheum arthritis Mother   . Ovarian cancer Daughter        Deceased at age 23    Social History Social History   Tobacco Use  . Smoking status: Former Smoker    Packs/day: 0.10    Years: 47.00    Pack years: 4.70    Types: Cigarettes  . Smokeless tobacco: Never Used  Substance Use Topics  . Alcohol use: Yes    Alcohol/week: 6.0 standard drinks    Types: 6 Cans of beer per week  . Drug use: Not Currently    Types: Heroin, "Crack" cocaine, Marijuana    Comment: 12/07/2017 "haven't used any drugs in the 2000s"     Allergies   Patient has no known allergies.   Review of Systems Review of Systems  Constitutional: Positive for activity change, appetite change, chills and fatigue. Negative for fever.  HENT: Negative for rhinorrhea and sore throat.   Eyes: Negative for pain and visual disturbance.  Respiratory: Positive for cough and shortness of breath.   Cardiovascular: Negative for chest pain and palpitations.  Gastrointestinal: Positive for abdominal pain, diarrhea and vomiting. Negative for blood in stool and nausea.  Genitourinary: Positive for dysuria. Negative for hematuria.  Musculoskeletal: Negative for arthralgias and back pain.  Skin: Negative for color change and rash.  Neurological: Negative for seizures and syncope.  All other systems reviewed and are negative.    Physical Exam Updated Vital Signs BP (!) 157/90   Pulse 89   Temp  97.9 F (36.6 C) (Axillary)   Resp (!) 35   Ht 5\' 11"  (1.803 m)   Wt 47.6 kg   SpO2 97%   BMI 14.64 kg/m   Physical Exam  Constitutional: He is oriented to person, place, and time.  Chronically ill-appearing African American male.  HENT:  Head: Normocephalic and atraumatic.  Dry mucous membranes.  Poor dentition.  Eyes: Pupils are equal, round, and reactive to light. Conjunctivae and EOM are normal.  Neck: Normal range of motion. Neck supple.  Cardiovascular: Normal rate, regular rhythm and intact distal pulses.  No murmur heard. Pulmonary/Chest: Effort normal and breath sounds normal. No stridor. He has no wheezes.  Rhonchorous throughout.  Abdominal: He exhibits distension. There is tenderness (diffuse). There is guarding.  Hyperactive bowel sounds with tinkling.  Peritonitic.  Musculoskeletal: He exhibits no edema.  Neurological: He is alert and oriented to person, place, and time.  Skin: Skin is warm and dry. Capillary refill takes less than 2 seconds. No rash noted.  Psychiatric: He has a normal mood and affect.  Nursing note and vitals reviewed.    ED Treatments / Results  Labs (all labs ordered are listed, but only abnormal results are displayed) Labs Reviewed  COMPREHENSIVE METABOLIC PANEL - Abnormal; Notable for the following components:      Result Value   Sodium 132 (*)    Potassium 5.4 (*)    CO2 14 (*)    Glucose, Bld 113 (*)    BUN 87 (*)    Creatinine, Ser 4.19 (*)    Albumin 2.1 (*)    Total Bilirubin 1.6 (*)    GFR calc non Af Amer 13 (*)    GFR calc Af Amer 16 (*)    All other components within normal limits  CBC - Abnormal; Notable for the following components:   WBC 28.0 (*)    RBC 3.94 (*)    Hemoglobin 11.8 (*)    HCT 36.2 (*)    RDW 15.9 (*)    Platelets 436 (*)    All other components within normal limits  I-STAT CG4 LACTIC ACID, ED - Abnormal; Notable for the following components:   Lactic Acid, Venous 3.03 (*)    All other components  within normal limits  I-STAT CG4 LACTIC ACID, ED - Abnormal; Notable for the following components:   Lactic Acid, Venous 2.50 (*)    All other components within normal limits  CULTURE, BLOOD (ROUTINE X 2)  CULTURE, BLOOD (ROUTINE X 2)  URINE CULTURE  LIPASE, BLOOD  URINALYSIS, ROUTINE W REFLEX MICROSCOPIC  T-HELPER CELLS (CD4) COUNT (NOT AT [facility])  TYPE AND SCREEN    EKG EKG Interpretation  Date/Time:  Thursday February 23 2018 10:25:50 EDT Ventricular Rate:  89 PR Interval:  128 QRS Duration: 84 QT Interval:  338 QTC Calculation: 411 R Axis:   59 Text Interpretation:  Normal sinus rhythm Minimal voltage criteria for LVH, may be normal variant Borderline ECG hyperacute T waves Confirmed by Rolan Bucco [phone]) on 03/09/2018 11:53:28 AM   Radiology Ct Abdomen Pelvis Wo Contrast  Result Date: 03/02/2018 CLINICAL DATA:  Abdominal pain, shortness of breath and weakness. Recent pneumonia with an associated effusion and sepsis. EXAM: CT CHEST, ABDOMEN AND PELVIS WITHOUT CONTRAST TECHNIQUE: Multidetector CT imaging of the chest, abdomen and pelvis was performed following the standard protocol without IV contrast. COMPARISON:  Chest radiographs obtained earlier today. Chest CT dated 12/07/2017. FINDINGS: CT CHEST FINDINGS Cardiovascular: Atheromatous calcifications, including the coronary arteries and aorta. Mediastinum/Nodes: No enlarged mediastinal, hilar, or axillary lymph nodes. Thyroid gland, trachea, and esophagus demonstrate no significant findings. Lungs/Pleura: Moderate-sized loculated right pleural effusion laterally. Minimal non loculated pleural fluid on the right. No pleural fluid on the left. Interval multiple focal patchy and nodular opacities in both upper lobes. Mild patchy opacity and volume loss in the right lower lobe. Minimal interval patchy opacity in the left lower lobe. Extensive bilateral bullous changes. Musculoskeletal: Thoracic and lower cervical spine degenerative  changes. CT ABDOMEN PELVIS FINDINGS Hepatobiliary: 2 gallstones in the dependent portion of the gallbladder, measuring up to 7 mm in maximum diameter each. No gallbladder wall thickening or pericholecystic fluid. Unremarkable liver. Pancreas: Unremarkable. No pancreatic ductal dilatation or surrounding inflammatory changes. Spleen: Normal in size without focal abnormality. Adrenals/Urinary Tract: Dilated urinary bladder, ureters and renal collecting systems. No calculi or masses seen. Stomach/Bowel: Moderate dilatation of the majority of the  small bowel with normal caliber distal loops. There is some mesenteric rotation with an appearance suggesting an internal hernia. No bowel wall thickening or pneumatosis. Unremarkable stomach and colon. The appendix is difficult to differentiate from fluid-filled small bowel loops with no gross evidence of appendicitis. Vascular/Lymphatic: Atheromatous arterial calcifications without aneurysm. No enlarged lymph nodes. Reproductive: Moderately enlarged and partially calcified prostate gland. Other: Proximal right inguinal hernia containing fat. Musculoskeletal: Severe right hip degenerative changes. Mild-to-moderate left hip degenerative changes. Mild lumbar spine degenerative changes. IMPRESSION: 1. Complete or high-grade partial small bowel obstruction with findings suggesting that this is due to an internal hernia. 2. Interval multiple focal patchy and nodular opacities in both upper lobes, compatible with multifocal pneumonia. 3. Minimal left lower lobe pneumonia and mild to moderate right lower lobe atelectasis/pneumonia. 4. Dilated urinary bladder with associated moderate bilateral hydroureter and mild to moderate bilateral hydronephrosis. The dilated bladder may be due to bladder outlet obstruction by the patient's moderately enlarged prostate gland. 5. Decreased size of a moderate-sized loculated right pleural effusion. 6.  Calcific coronary artery and aortic  atherosclerosis. 7. Extensive bilateral bullous emphysema. 8. Cholelithiasis. 9. Severe right hip degenerative changes. Aortic Atherosclerosis (ICD10-I70.0) and Emphysema (ICD10-J43.9). Electronically Signed   By: Beckie Salts M.D.   On: 03/07/2018 13:33   Dg Chest 2 View  Result Date: 03/23/2018 CLINICAL DATA:  Acute shortness of breath. EXAM: CHEST - 2 VIEW COMPARISON:  12/25/2017 and prior radiographs FINDINGS: The cardiomediastinal silhouette is unchanged. RIGHT pleural effusion which appears loculated appears decreased in size from the prior study. RIGHT basilar atelectasis/scarring noted. No pneumothorax or new pulmonary opacities identified. No acute bony abnormalities are noted. IMPRESSION: Decreased RIGHT pleural effusion which appears loculated. Associated RIGHT basilar atelectasis/scarring. Electronically Signed   By: Harmon Pier M.D.   On: 03/13/2018 12:58   Ct Chest Wo Contrast  Result Date: 02/26/2018 CLINICAL DATA:  Abdominal pain, shortness of breath and weakness. Recent pneumonia with an associated effusion and sepsis. EXAM: CT CHEST, ABDOMEN AND PELVIS WITHOUT CONTRAST TECHNIQUE: Multidetector CT imaging of the chest, abdomen and pelvis was performed following the standard protocol without IV contrast. COMPARISON:  Chest radiographs obtained earlier today. Chest CT dated 12/07/2017. FINDINGS: CT CHEST FINDINGS Cardiovascular: Atheromatous calcifications, including the coronary arteries and aorta. Mediastinum/Nodes: No enlarged mediastinal, hilar, or axillary lymph nodes. Thyroid gland, trachea, and esophagus demonstrate no significant findings. Lungs/Pleura: Moderate-sized loculated right pleural effusion laterally. Minimal non loculated pleural fluid on the right. No pleural fluid on the left. Interval multiple focal patchy and nodular opacities in both upper lobes. Mild patchy opacity and volume loss in the right lower lobe. Minimal interval patchy opacity in the left lower lobe.  Extensive bilateral bullous changes. Musculoskeletal: Thoracic and lower cervical spine degenerative changes. CT ABDOMEN PELVIS FINDINGS Hepatobiliary: 2 gallstones in the dependent portion of the gallbladder, measuring up to 7 mm in maximum diameter each. No gallbladder wall thickening or pericholecystic fluid. Unremarkable liver. Pancreas: Unremarkable. No pancreatic ductal dilatation or surrounding inflammatory changes. Spleen: Normal in size without focal abnormality. Adrenals/Urinary Tract: Dilated urinary bladder, ureters and renal collecting systems. No calculi or masses seen. Stomach/Bowel: Moderate dilatation of the majority of the small bowel with normal caliber distal loops. There is some mesenteric rotation with an appearance suggesting an internal hernia. No bowel wall thickening or pneumatosis. Unremarkable stomach and colon. The appendix is difficult to differentiate from fluid-filled small bowel loops with no gross evidence of appendicitis. Vascular/Lymphatic: Atheromatous arterial calcifications without aneurysm. No enlarged  lymph nodes. Reproductive: Moderately enlarged and partially calcified prostate gland. Other: Proximal right inguinal hernia containing fat. Musculoskeletal: Severe right hip degenerative changes. Mild-to-moderate left hip degenerative changes. Mild lumbar spine degenerative changes. IMPRESSION: 1. Complete or high-grade partial small bowel obstruction with findings suggesting that this is due to an internal hernia. 2. Interval multiple focal patchy and nodular opacities in both upper lobes, compatible with multifocal pneumonia. 3. Minimal left lower lobe pneumonia and mild to moderate right lower lobe atelectasis/pneumonia. 4. Dilated urinary bladder with associated moderate bilateral hydroureter and mild to moderate bilateral hydronephrosis. The dilated bladder may be due to bladder outlet obstruction by the patient's moderately enlarged prostate gland. 5. Decreased size of a  moderate-sized loculated right pleural effusion. 6.  Calcific coronary artery and aortic atherosclerosis. 7. Extensive bilateral bullous emphysema. 8. Cholelithiasis. 9. Severe right hip degenerative changes. Aortic Atherosclerosis (ICD10-I70.0) and Emphysema (ICD10-J43.9). Electronically Signed   By: Beckie Salts M.D.   On: 2018-03-03 13:33    Procedures Procedures (including critical care time)  Medications Ordered in ED Medications  vancomycin (VANCOCIN) IVPB 1000 mg/200 mL premix (0 mg Intravenous Stopped 03/03/18 1416)  ceFEPIme (MAXIPIME) 2 g in sodium chloride 0.9 % 100 mL IVPB (0 g Intravenous Stopped March 03, 2018 1323)  lactated ringers bolus 1,000 mL (1,000 mLs Intravenous Bolus March 03, 2018 1221)     Initial Impression / Assessment and Plan / ED Course  I have reviewed the triage vital signs and the nursing notes.  Pertinent labs & imaging results that were available during my care of the patient were reviewed by me and considered in my medical decision making (see chart for details).      Patient is a 67yo male with HIV, chronic hepatitis C, HTN, COPD, and polysubstance use (heroin and tobaacco use) who presents with worsening constant diffuse abdominal pain and distension x 3 days.  Of note patient recently admitted for also focal pneumonia with empyema that required chest tube placement.  On arrival he is HDS however ill-appearing.  Exam as above concerning for acute abdomen given significant tenderness, guarding, and distention.  Code sepsis initiated and CT w/o contrast obtained.  CT chest abdomen pelvis significant for complete vs partial high-grade small bowel obstruction likely 2/2 internal hernia.  Also found to have multifocal pneumonia.  Dilated urinary bladder with associated moderate bilateral hydro-ureter and hydronephrosis 2/2 bladder outlet obstruction given mildly enlarged prostate gland.  Decreased size of moderate loculated right pleural effusion.  Patient given vancomycin  and cefepime after cultures were drawn.  Surgery consulted.  Labs significant for leukocytosis of 28, Hgb 11.8, lactic acidosis of 3, AKI as BUN 87 snd Cr 4.19 (baseline Cr 0.75), CO2 14, and electrolyte disturbances (NA 132 and K 5.4).  Given IVF bolus and made NPO.  1350 - Surgery consulted and NG tube placed.  Nursing attempted to place Foley catheter several times including a coude catheter without success.  Urology consulted for placement.  1545 - Surgery paged 3 times and now at bedside.  Will take to the OR and will require admission to critical care.  1555 - Critical care consulted for admission as he will likely remain intubated post op.  1640 - Discussed KUB with radiology who states NG tube is in his R bronchus.  Instructed RN to remove NG tube.  Awaiting transport to OR.  Final Clinical Impressions(s) / ED Diagnoses   Final diagnoses:  SBO (small bowel obstruction) (HCC)  Multifocal pneumonia    ED Discharge Orders  None       Abelardo Diesel, MD 02/25/18 [id number]    Rolan Bucco, MD 02/25/18 [phone]

## 2018-02-24 ENCOUNTER — Inpatient Hospital Stay (HOSPITAL_COMMUNITY): Payer: Medicare Other

## 2018-02-24 ENCOUNTER — Other Ambulatory Visit (HOSPITAL_COMMUNITY): Payer: Medicare Other

## 2018-02-24 ENCOUNTER — Encounter (HOSPITAL_COMMUNITY): Payer: Self-pay | Admitting: Surgery

## 2018-02-24 DIAGNOSIS — Z720 Tobacco use: Secondary | ICD-10-CM

## 2018-02-24 DIAGNOSIS — K56609 Unspecified intestinal obstruction, unspecified as to partial versus complete obstruction: Secondary | ICD-10-CM

## 2018-02-24 DIAGNOSIS — B182 Chronic viral hepatitis C: Secondary | ICD-10-CM

## 2018-02-24 DIAGNOSIS — E872 Acidosis: Secondary | ICD-10-CM

## 2018-02-24 DIAGNOSIS — Z9911 Dependence on respirator [ventilator] status: Secondary | ICD-10-CM

## 2018-02-24 DIAGNOSIS — K3532 Acute appendicitis with perforation and localized peritonitis, without abscess: Secondary | ICD-10-CM | POA: Diagnosis present

## 2018-02-24 DIAGNOSIS — A419 Sepsis, unspecified organism: Secondary | ICD-10-CM

## 2018-02-24 DIAGNOSIS — Z8701 Personal history of pneumonia (recurrent): Secondary | ICD-10-CM

## 2018-02-24 DIAGNOSIS — R7881 Bacteremia: Secondary | ICD-10-CM | POA: Diagnosis present

## 2018-02-24 DIAGNOSIS — B20 Human immunodeficiency virus [HIV] disease: Secondary | ICD-10-CM

## 2018-02-24 DIAGNOSIS — N179 Acute kidney failure, unspecified: Secondary | ICD-10-CM

## 2018-02-24 DIAGNOSIS — Z87891 Personal history of nicotine dependence: Secondary | ICD-10-CM

## 2018-02-24 DIAGNOSIS — K659 Peritonitis, unspecified: Secondary | ICD-10-CM

## 2018-02-24 DIAGNOSIS — B9562 Methicillin resistant Staphylococcus aureus infection as the cause of diseases classified elsewhere: Secondary | ICD-10-CM

## 2018-02-24 DIAGNOSIS — K651 Peritoneal abscess: Secondary | ICD-10-CM

## 2018-02-24 DIAGNOSIS — K3533 Acute appendicitis with perforation and localized peritonitis, with abscess: Secondary | ICD-10-CM

## 2018-02-24 LAB — CBC WITH DIFFERENTIAL/PLATELET
Basophils Absolute: 0 10*3/uL (ref 0.0–0.1)
Basophils Relative: 0 %
EOS PCT: 0 %
Eosinophils Absolute: 0 10*3/uL (ref 0.0–0.7)
HCT: 30.3 % — ABNORMAL LOW (ref 39.0–52.0)
HEMOGLOBIN: 9.9 g/dL — AB (ref 13.0–17.0)
LYMPHS ABS: 0.6 10*3/uL — AB (ref 0.7–4.0)
Lymphocytes Relative: 3 %
MCH: 29.9 pg (ref 26.0–34.0)
MCHC: 32.7 g/dL (ref 30.0–36.0)
MCV: 91.5 fL (ref 78.0–100.0)
MONO ABS: 0.2 10*3/uL (ref 0.1–1.0)
MONOS PCT: 1 %
NEUTROS ABS: 20.2 10*3/uL — AB (ref 1.7–7.7)
Neutrophils Relative %: 96 %
Platelets: 286 10*3/uL (ref 150–400)
RBC: 3.31 MIL/uL — ABNORMAL LOW (ref 4.22–5.81)
RDW: 16.3 % — AB (ref 11.5–15.5)
WBC Morphology: INCREASED
WBC: 21 10*3/uL — ABNORMAL HIGH (ref 4.0–10.5)

## 2018-02-24 LAB — BLOOD CULTURE ID PANEL (REFLEXED)
Acinetobacter baumannii: NOT DETECTED
CANDIDA GLABRATA: NOT DETECTED
CANDIDA KRUSEI: NOT DETECTED
CANDIDA TROPICALIS: NOT DETECTED
Candida albicans: NOT DETECTED
Candida parapsilosis: NOT DETECTED
ENTEROBACTER CLOACAE COMPLEX: NOT DETECTED
ESCHERICHIA COLI: NOT DETECTED
Enterobacteriaceae species: NOT DETECTED
Enterococcus species: NOT DETECTED
Haemophilus influenzae: NOT DETECTED
KLEBSIELLA OXYTOCA: NOT DETECTED
Klebsiella pneumoniae: NOT DETECTED
Listeria monocytogenes: NOT DETECTED
Methicillin resistance: DETECTED — AB
Neisseria meningitidis: NOT DETECTED
PROTEUS SPECIES: NOT DETECTED
Pseudomonas aeruginosa: NOT DETECTED
STAPHYLOCOCCUS SPECIES: DETECTED — AB
Serratia marcescens: NOT DETECTED
Staphylococcus aureus (BCID): DETECTED — AB
Streptococcus agalactiae: NOT DETECTED
Streptococcus pneumoniae: NOT DETECTED
Streptococcus pyogenes: NOT DETECTED
Streptococcus species: NOT DETECTED

## 2018-02-24 LAB — POCT I-STAT 3, ART BLOOD GAS (G3+)
Acid-base deficit: 6 mmol/L — ABNORMAL HIGH (ref 0.0–2.0)
BICARBONATE: 18.1 mmol/L — AB (ref 20.0–28.0)
O2 SAT: 100 %
PCO2 ART: 30.7 mmHg — AB (ref 32.0–48.0)
PH ART: 7.377 (ref 7.350–7.450)
PO2 ART: 281 mmHg — AB (ref 83.0–108.0)
Patient temperature: 98
TCO2: 19 mmol/L — ABNORMAL LOW (ref 22–32)

## 2018-02-24 LAB — GLUCOSE, CAPILLARY
GLUCOSE-CAPILLARY: 92 mg/dL (ref 70–99)
GLUCOSE-CAPILLARY: 90 mg/dL (ref 70–99)
Glucose-Capillary: 82 mg/dL (ref 70–99)

## 2018-02-24 LAB — BASIC METABOLIC PANEL
Anion gap: 11 (ref 5–15)
BUN: 89 mg/dL — ABNORMAL HIGH (ref 8–23)
CALCIUM: 8.1 mg/dL — AB (ref 8.9–10.3)
CHLORIDE: 107 mmol/L (ref 98–111)
CO2: 18 mmol/L — AB (ref 22–32)
CREATININE: 3.66 mg/dL — AB (ref 0.61–1.24)
GFR calc Af Amer: 18 mL/min — ABNORMAL LOW (ref >60)
GFR calc non Af Amer: 16 mL/min — ABNORMAL LOW (ref >60)
GLUCOSE: 124 mg/dL — AB (ref 70–99)
Potassium: 5.2 mmol/L — ABNORMAL HIGH (ref 3.5–5.1)
Sodium: 136 mmol/L (ref 135–145)

## 2018-02-24 LAB — BODY FLUID CELL COUNT WITH DIFFERENTIAL
Lymphs, Fluid: 1 %
MONOCYTE-MACROPHAGE-SEROUS FLUID: 9 % — AB (ref 50–90)
Neutrophil Count, Fluid: 90 % — ABNORMAL HIGH (ref 0–25)
WBC FLUID: 9200 uL — AB (ref 0–1000)

## 2018-02-24 LAB — T-HELPER CELLS (CD4) COUNT (NOT AT ARMC)
CD4 T CELL ABS: 180 /uL — AB (ref 400–2700)
CD4 T CELL HELPER: 29 % — AB (ref 33–55)

## 2018-02-24 LAB — MAGNESIUM: Magnesium: 1.7 mg/dL (ref 1.7–2.4)

## 2018-02-24 LAB — PHOSPHORUS: PHOSPHORUS: 3.6 mg/dL (ref 2.5–4.6)

## 2018-02-24 MED ORDER — INSULIN ASPART 100 UNIT/ML ~~LOC~~ SOLN
0.0000 [IU] | Freq: Four times a day (QID) | SUBCUTANEOUS | Status: DC
  Administered 2018-02-27: 1 [IU] via SUBCUTANEOUS

## 2018-02-24 MED ORDER — SODIUM CHLORIDE 0.9 % IV BOLUS
500.0000 mL | Freq: Once | INTRAVENOUS | Status: AC
  Administered 2018-02-24: 500 mL via INTRAVENOUS

## 2018-02-24 MED ORDER — TRAVASOL 10 % IV SOLN
INTRAVENOUS | Status: AC
  Administered 2018-02-24: 19:00:00 via INTRAVENOUS
  Filled 2018-02-24: qty 420

## 2018-02-24 MED ORDER — LACTATED RINGERS IV BOLUS
1000.0000 mL | Freq: Once | INTRAVENOUS | Status: AC
  Administered 2018-02-24: 1000 mL via INTRAVENOUS

## 2018-02-24 MED ORDER — ENOXAPARIN SODIUM 30 MG/0.3ML ~~LOC~~ SOLN
30.0000 mg | SUBCUTANEOUS | Status: DC
  Administered 2018-02-24: 30 mg via SUBCUTANEOUS
  Filled 2018-02-24: qty 0.3

## 2018-02-24 MED ORDER — MIDAZOLAM HCL 2 MG/2ML IJ SOLN
1.0000 mg | Freq: Once | INTRAMUSCULAR | Status: AC
  Administered 2018-02-24: 1 mg via INTRAVENOUS
  Filled 2018-02-24: qty 2

## 2018-02-24 MED ORDER — MAGNESIUM SULFATE IN D5W 1-5 GM/100ML-% IV SOLN
1.0000 g | Freq: Once | INTRAVENOUS | Status: AC
  Administered 2018-02-24: 1 g via INTRAVENOUS
  Filled 2018-02-24: qty 100

## 2018-02-24 MED ORDER — STERILE WATER FOR INJECTION IJ SOLN
5.0000 mg | Freq: Two times a day (BID) | RESPIRATORY_TRACT | Status: DC
  Administered 2018-02-25 (×2): 5 mg via INTRAPLEURAL
  Filled 2018-02-24 (×6): qty 5

## 2018-02-24 MED ORDER — ENOXAPARIN SODIUM 40 MG/0.4ML ~~LOC~~ SOLN: 40.0000 mg | SUBCUTANEOUS | Status: DC

## 2018-02-24 MED ORDER — SODIUM CHLORIDE 0.9 % IJ SOLN
10.0000 mg | Freq: Two times a day (BID) | INTRAMUSCULAR | Status: DC
  Administered 2018-02-24 – 2018-02-25 (×3): 10 mg via INTRAPLEURAL
  Filled 2018-02-24 (×5): qty 10

## 2018-02-24 MED ORDER — HEPARIN SODIUM (PORCINE) 5000 UNIT/ML IJ SOLN
5000.0000 [IU] | Freq: Three times a day (TID) | INTRAMUSCULAR | Status: DC
  Administered 2018-02-25 – 2018-02-27 (×4): 5000 [IU] via SUBCUTANEOUS
  Filled 2018-02-24 (×6): qty 1

## 2018-02-24 NOTE — Progress Notes (Signed)
NAME:  Kevin Jimenez, MRN:  [id number], DOB:  Feb 02, 1951, LOS: 1 ADMISSION DATE:  06-Mar-2018, CONSULTATION DATE:  03-06-2018 REFERRING MD:  Corliss Skains CHIEF COMPLAINT:  Abd pain   Brief History   Kevin Jimenez is a 67 y.o. male who was admitted 10/3 with SBO and perforated appendicitis.  He was taken to the OR where he had ex-lap, appendectomy, drainage of intraabdominal abscess, lysis of adhesions with release of SBO. Post operatively, he remained ventilated; therefore, PCCM was asked to assist with vent management.  Significant Hospital Events   10/3 Admit.  Consults: date of consult/date signed off & final recs:  Surgery 10/3 > admit. PCCM 10/3  Procedures (surgical and bedside):  ETT 10/3 > Radial art line 10/3 >  RLQ JP drain 10/3 >   Significant Diagnostic Tests:  CT chest / abd / pelv 10/3 > complete or high grade SBO likely due to an internal hernia, multifocal PNA, dilated bladder with mod bilateral hydroureter and mild to mod bilateral hydronephrosis, small loculated right pleural effusion, cholelithiasis.  Micro Data: Blood 10/3 >> GPC's in clusters 2/2 >>  BCID 10/2 >> staph species  Urine 10/3 >  Abd fluid 10/3 >> few staph >>   Antimicrobials:  Vanc 10/3 >>  Zosyn 10/3 >>  Subjective:  RN reports UOP bloody/thick, clots. RT reports low rate on PSV wean.  Afebrile.   Objective   Blood pressure 116/66, pulse 90, temperature (!) 97.5 F (36.4 C), temperature source Axillary, resp. rate 16, height 5\' 11"  (1.803 m), weight 47.6 kg, SpO2 100 %.    Vent Mode: PRVC FiO2 (%):  [40 %-100 %] 40 % Set Rate:  [16 bmp] 16 bmp Vt Set:  [600 mL] 600 mL PEEP:  [5 cmH20] 5 cmH20 Plateau Pressure:  [17 cmH20-19 cmH20] 17 cmH20   Intake/Output Summary (Last 24 hours) at 02/24/2018 1128 Last data filed at 02/24/2018 1100 Gross per 24 hour  Intake 2444.69 ml  Output 630 ml  Net 1814.69 ml   Filed Weights   2018-03-06 1025  Weight: 47.6 kg    Examination: General: chronically  ill appearing male in NAD on vent   HEENT: MM pink/moist, ETT, temporal wasting, poor dentition Neuro: awakens with stimulation but drifts back to sleep, reaches for ETT CV: s1s2 rrr, no m/r/g PULM: even/non-labored, clear on left, diminished on right GI: distended, midline dressing c/d/i, JP drain with yellow/pink drainage Extremities: warm/dry, 1+ BLE pitting edema  Skin: no rashes or lesions  Assessment & Plan:   SBO with peritonitis and perforated appendicitis - s/p ex-lap, appendectomy, drainage of intraabdominal abscess, lysis of adhesions with release of SBO. P: Post operative care per CCS Continue abx as above. If decompensates, consider addition of antifungal  CCS expects prolonged ileus > place central access    Acute Respiratory Insufficiency - s/p intubation prior to OR. P: PRVC 8cc/kg  PSV as able  Follow intermittent CXR    Right Loculated Pleural Effusion - hx of empyema in 11/2017 s/p pigtail chest tube per CVTS, cultures negative at that time.  P: Assess pleural space with 12/2017  May need chest tube drainage, tPA + pulmozyme    Sedation needs due to mechanical ventilation P: Fentanyl gtt for pain  PRN versed  RASS Goal: 0 to -1  Daily WUA    Multifocal PNA. P: ABX as above  Follow CXR    Sepsis / GPC Bacteremia  P: Follow cultures, abx as above   AKI - Presumed post-renal from  bladder outlet obstruction secondary to enlarged prostate. AGMA - due to renal failure + lactate. Hypomagnesemia  P: Bicarb gtt at 57ml/hr for now Trend BMP / urinary output Replace electrolytes as indicated Avoid nephrotoxic agents, ensure adequate renal perfusion Hope renal function will continue to improve with foley.  Will need Urology follow up at some point / may need to discharge with foley in place    Hyponatremia - suspect hypovolemic. P: NS as above    Obstructive uropathy with bilateral hydroureter and hydronephrosis - reportedly seen by urology in ED but  no notes in system yet. 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 P: Continue thiamine, folate  Cessation counseling when able    Hx sCHF (Echo from 12/10/17 with EF 30 - 35%), HTN. P: Hold home diltiazem, furosemide, lopressor    Disposition / Summary of Today's Plan 02/24/18   ICU.  Will need [place] assessment of R pleural space.  Placement of central venous access.     Diet: NPO. Pain/Anxiety/Delirium protocol (if indicated): fentanyl gtt / midazolam PRN. VAP protocol (if indicated): In place. DVT prophylaxis: SCD's. GI prophylaxis: Famotidine. Hyperglycemia protocol: N/A. Mobility: Bedrest. Code Status: Full - NOTE, on prior admit 7/17 pt was listed as DNR.  Attempted to reach pt's mother tonight to clarify code status; however, no answer. Family Communication: Attempted to reach pt's mother over the phone but no answer.  Labs   CBC: Recent Labs  Lab 02/26/2018 1036 02/24/18 0556  WBC 28.0* 21.0*  NEUTROABS  --  20.2*  HGB 11.8* 9.9*  HCT 36.2* 30.3*  MCV 91.9 91.5  PLT 436* 286   Basic Metabolic Panel: Recent Labs  Lab 03/09/2018 1036 03/15/2018 2057 02/24/18 0556  NA 132* 132* 136  K 5.4* 5.5* 5.2*  CL 103 107 107  CO2 14* 15* 18*  GLUCOSE 113* 101* 124*  BUN 87* 90* 89*  CREATININE 4.19* 3.89* 3.66*  CALCIUM 9.4 8.5* 8.1*  MG  --   --  1.7   GFR: Estimated Creatinine Clearance: 13.2 mL/min (A) (by C-G formula based on SCr of 3.66 mg/dL (H)). Recent Labs  Lab 03/13/2018 1036 03/21/2018 1222 03/16/2018 1452 02/21/2018 2030 02/24/18 0556  WBC 28.0*  --   --   --  21.0*  LATICACIDVEN  --  3.03* 2.50* 1.3  --    Liver Function  Tests: Recent Labs  Lab 03/20/2018 1036 03/11/2018 2057  AST 31 26  ALT 12 9  ALKPHOS 89 55  BILITOT 1.6* 1.8*  PROT 7.2 6.0*  ALBUMIN 2.1* 2.0*   Recent Labs  Lab 02/22/2018 1036  LIPASE 20   No results for input(s): AMMONIA in the last 168 hours. ABG    Component Value Date/Time   PHART 7.377 02/24/2018 0347   PCO2ART 30.7 (L) 02/24/2018 0347   PO2ART 281.0 (H) 02/24/2018 0347   HCO3 18.1 (L) 02/24/2018 0347   TCO2 19 (L) 02/24/2018 0347   ACIDBASEDEF 6.0 (H) 02/24/2018 0347   O2SAT 100.0 02/24/2018 0347    Coagulation Profile: No results for input(s): INR, PROTIME in the last 168 hours.   Cardiac Enzymes: No results for input(s): CKTOTAL,  CKMB, CKMBINDEX, TROPONINI in the last 168 hours.   HbA1C: No results found for: HGBA1C   CBG: No results for input(s): GLUCAP in the last 168 hours.    Canary Brim, NP-C [facility] Pgr: [phone] or if no answer [phone] 02/24/2018, 11:28 AM

## 2018-02-24 NOTE — Progress Notes (Signed)
eLink Physician-Brief Progress Note Patient Name: Burton Gahan DOB: 06-20-1950 MRN: [id number]   Date of Service  02/24/2018  HPI/Events of Note  Multiple issues:  1. Tachycardia and 2. Oliguria - No CVL or CVP. EF = 30-35%. K+ noted to be 5.5 earlier.   eICU Interventions  Will order: 1. Bolus with 0.9 NaCl 500 mL IV over 30 minutes now.  2. ABG, CBC, Platelets, BMP and Mg++ level in AM. 3. 12 Lead EKG now.      Intervention Category Major Interventions: Arrhythmia - evaluation and management Intermediate Interventions: Oliguria - evaluation and management  Hardie Veltre [place] 02/24/2018, 1:25 AM

## 2018-02-24 NOTE — Plan of Care (Signed)
Patient's family educated regarding diagnosis, procedures performed.

## 2018-02-24 NOTE — Consult Note (Signed)
[facility] for Infectious Disease    Date of Admission:  02/22/2018           Day 2 vancomycin        Day 2 piperacillin tazobactam       Reason for Consult: Automatic consultation for MRSA bacteremia     Assessment: He has acute rupture of appendicitis complicated by intra-abdominal infection and MRSA bacteremia.  Continue current antibiotic therapy, repeat blood cultures and obtain a TTE.  Plan: 1. Continue current antibiotics 2. Repeat blood cultures 3. TTE 4. Withhold antiretroviral therapy until he can take oral medications again  Principal Problem:   MRSA bacteremia Active Problems:   Sepsis ([place])   Peritonitis ([place])   Pelvic abscess in male [facility])   Ruptured appendicitis   HIV (+) Human immunodeficiency virus disease ([place])   Chronic hepatitis C without hepatic coma ([place])   History of substance abuse ([place])   Tobacco abuse   Protein-calorie malnutrition, severe   SBO (small bowel obstruction) ([place])   Acute renal failure (ARF) ([place])   Metabolic acidosis   Endotracheally intubated   Scheduled Meds: . chlorhexidine gluconate (MEDLINE KIT)  15 mL Mouth Rinse BID  . Chlorhexidine Gluconate Cloth  6 each Topical Q0600  . heparin injection (subcutaneous)  5,000 Units Subcutaneous Q8H  . insulin aspart  0-9 Units Subcutaneous Q6H  . ipratropium-albuterol  3 mL Nebulization Q6H  . mouth rinse  15 mL Mouth Rinse 10 times per day  . mupirocin ointment  1 application Nasal BID   Continuous Infusions: . sodium chloride 50 mL/hr at 02/24/18 1200  . sodium chloride Stopped (02/24/18 1111)  . fentaNYL infusion INTRAVENOUS 100 mcg/hr (02/24/18 1200)  . magnesium sulfate 1 - 4 g bolus IVPB    . piperacillin-tazobactam (ZOSYN)  IV 2.25 g (02/24/18 1221)  .  sodium bicarbonate  infusion 1000 mL 75 mL/hr at 02/24/18 1200  . TPN ADULT (ION)    . [START ON 02/25/2018] vancomycin     PRN Meds:.sodium chloride, fentaNYL, midazolam, midazolam  HPI: Kevin Jimenez is  a 67 y.o. male with HIV and chronic hepatitis C infection who was recently admitted with pneumonia and a right pleural effusion.  He was readmitted yesterday with abdominal pain, nausea, vomiting and diarrhea.  He was found to have small bowel obstruction and underwent emergent surgery which revealed ruptured appendicitis and intra-abdominal abscess with peritonitis.  Both admission blood cultures have grown MRSA and gram-positive cocci he has been seen from his operative specimen.   Review of Systems: Review of Systems  Unable to perform ROS: Intubated    Past Medical History:  Diagnosis Date  . Arthritis    "mainly right hand" (12/07/2017)  . Chronic hepatitis C ([place])   . COPD (chronic obstructive pulmonary disease) ([place])   . History of substance abuse ([place]) 03/31/2015   Alcohol and heroin abuse   . HIV (human immunodeficiency virus infection) ([place])    "dx'd in the late 1990s"  . Hypertension   . Pneumonia 12/07/2017    Social History   Tobacco Use  . Smoking status: Former Smoker    Packs/day: 0.10    Years: 47.00    Pack years: 4.70    Types: Cigarettes  . Smokeless tobacco: Never Used  Substance Use Topics  . Alcohol use: Yes    Alcohol/week: 6.0 standard drinks    Types: 6 Cans of beer per week  . Drug use: Not Currently  Types: Heroin, "Crack" cocaine, Marijuana    Comment: 12/07/2017 "haven't used any drugs in the 2000s"    Family History  Problem Relation Age of Onset  . Hypertension Mother   . Rheum arthritis Mother   . Ovarian cancer Daughter        Deceased at age 23   No Known Allergies  OBJECTIVE: Blood pressure 111/66, pulse 92, temperature (!) 97 F (36.1 C), temperature source Axillary, resp. rate 16, height 5' 11"  (1.803 m), weight 51.7 kg, SpO2 100 %.  Physical Exam  Constitutional:  He is sedated and on a ventilator.  He is cachectic with temporal wasting.  Cardiovascular: Normal rate, regular rhythm and normal heart sounds.    Pulmonary/Chest: Breath sounds normal.  Abdominal: Soft. He exhibits distension.  Gauze dressing over midline incision.    Lab Results Lab Results  Component Value Date   WBC 21.0 (H) 02/24/2018   HGB 9.9 (L) 02/24/2018   HCT 30.3 (L) 02/24/2018   MCV 91.5 02/24/2018   PLT 286 02/24/2018    Lab Results  Component Value Date   CREATININE 3.66 (H) 02/24/2018   BUN 89 (H) 02/24/2018   NA 136 02/24/2018   K 5.2 (H) 02/24/2018   CL 107 02/24/2018   CO2 18 (L) 02/24/2018    Lab Results  Component Value Date   ALT 9 03/09/2018   AST 26 02/24/2018   ALKPHOS 55 03/16/2018   BILITOT 1.8 (H) 03/16/2018     Microbiology: Recent Results (from the past 240 hour(s))  Blood Culture (routine x 2)     Status: None (Preliminary result)   Collection Time: 02/21/2018 11:58 AM  Result Value Ref Range Status   Specimen Description BLOOD RIGHT ANTECUBITAL  Final   Special Requests   Final    BOTTLES DRAWN AEROBIC AND ANAEROBIC Blood Culture adequate volume   Culture  Setup Time   Final    GRAM POSITIVE COCCI IN CLUSTERS IN BOTH AEROBIC AND ANAEROBIC BOTTLES CRITICAL VALUE NOTED.  VALUE IS CONSISTENT WITH PREVIOUSLY REPORTED AND CALLED VALUE.    Culture   Final    NO GROWTH < 24 HOURS Performed at [facility], [place] [address], [place], [place] [address]    Report Status PENDING  Incomplete  Blood Culture (routine x 2)     Status: Abnormal (Preliminary result)   Collection Time: 03/02/2018 12:10 PM  Result Value Ref Range Status   Specimen Description BLOOD LEFT ANTECUBITAL  Final   Special Requests   Final    BOTTLES DRAWN AEROBIC AND ANAEROBIC Blood Culture adequate volume   Culture  Setup Time   Final    GRAM POSITIVE COCCI IN CLUSTERS IN BOTH AEROBIC AND ANAEROBIC BOTTLES CRITICAL RESULT CALLED TO, READ BACK BY AND VERIFIED WITH: K.AMEND,PHARMD AT 2633 ON 02/24/18 BY G.MCADOO    Culture (A)  Final    STAPHYLOCOCCUS AUREUS CULTURE REINCUBATED FOR BETTER GROWTH Performed at  [facility], [place] [address], [place], [place] [address]    Report Status PENDING  Incomplete  Blood Culture ID Panel (Reflexed)     Status: Abnormal   Collection Time: 02/21/2018 12:10 PM  Result Value Ref Range Status   Enterococcus species NOT DETECTED NOT DETECTED Final   Listeria monocytogenes NOT DETECTED NOT DETECTED Final   Staphylococcus species DETECTED (A) NOT DETECTED Final    Comment: CRITICAL RESULT CALLED TO, READ BACK BY AND VERIFIED WITH: K.AMEND,PHARMD AT 0250 ON 02/24/18 BY G.MCADOO  Staphylococcus aureus (BCID) DETECTED (A) NOT DETECTED Final    Comment: Methicillin (oxacillin)-resistant Staphylococcus aureus (MRSA). MRSA is predictably resistant to beta-lactam antibiotics (except ceftaroline). Preferred therapy is vancomycin unless clinically contraindicated. Patient requires contact precautions if  hospitalized. CRITICAL RESULT CALLED TO, READ BACK BY AND VERIFIED WITH: K.AMEND,PHARMD AT 0250 ON 02/24/18 BY G.MCADOO    Methicillin resistance DETECTED (A) NOT DETECTED Final    Comment: CRITICAL RESULT CALLED TO, READ BACK BY AND VERIFIED WITH: K.AMEND,PHARMD AT 0250 ON 02/24/18 BY G.MCADOO    Streptococcus species NOT DETECTED NOT DETECTED Final   Streptococcus agalactiae NOT DETECTED NOT DETECTED Final   Streptococcus pneumoniae NOT DETECTED NOT DETECTED Final   Streptococcus pyogenes NOT DETECTED NOT DETECTED Final   Acinetobacter baumannii NOT DETECTED NOT DETECTED Final   Enterobacteriaceae species NOT DETECTED NOT DETECTED Final   Enterobacter cloacae complex NOT DETECTED NOT DETECTED Final   Escherichia coli NOT DETECTED NOT DETECTED Final   Klebsiella oxytoca NOT DETECTED NOT DETECTED Final   Klebsiella pneumoniae NOT DETECTED NOT DETECTED Final   Proteus species NOT DETECTED NOT DETECTED Final   Serratia marcescens NOT DETECTED NOT DETECTED Final   Haemophilus influenzae NOT DETECTED NOT DETECTED Final   Neisseria meningitidis NOT DETECTED NOT  DETECTED Final   Pseudomonas aeruginosa NOT DETECTED NOT DETECTED Final   Candida albicans NOT DETECTED NOT DETECTED Final   Candida glabrata NOT DETECTED NOT DETECTED Final   Candida krusei NOT DETECTED NOT DETECTED Final   Candida parapsilosis NOT DETECTED NOT DETECTED Final   Candida tropicalis NOT DETECTED NOT DETECTED Final    Comment: Performed at [facility], [place]. [address] [place], [place] [address]  Aerobic/Anaerobic Culture (surgical/deep wound)     Status: None (Preliminary result)   Collection Time: 02/24/2018  6:38 PM  Result Value Ref Range Status   Specimen Description FLUID PERITONEAL  Final   Special Requests SWAB PT ON VANC,MAXIPIME  Final   Gram Stain   Final    MODERATE WBC PRESENT, PREDOMINANTLY PMN FEW GRAM POSITIVE COCCI    Culture   Final    FEW STAPHYLOCOCCUS AUREUS CULTURE REINCUBATED FOR BETTER GROWTH Performed at [facility], [place] [address] [place], [place] [address]    Report Status PENDING  Incomplete  MRSA PCR Screening     Status: Abnormal   Collection Time: 03/04/2018  8:38 PM  Result Value Ref Range Status   MRSA by PCR POSITIVE (A) NEGATIVE Final    Comment:        The GeneXpert MRSA Assay (FDA approved for NASAL specimens only), is one component of a comprehensive MRSA colonization surveillance program. It is not intended to diagnose MRSA infection nor to guide or monitor treatment for MRSA infections. RESULT CALLED TO, READ BACK BY AND VERIFIED WITH: ARMSTRONG,J RN 2302 03/01/2018 MITCHELL,L Performed at [facility], [place] [address], [place], [place] [address]     Michel Bickers, [place] [facility] [phone] pager   [phone] cell 02/24/2018, 12:48 PM

## 2018-02-24 NOTE — Progress Notes (Addendum)
PHARMACY - ADULT TOTAL PARENTERAL NUTRITION CONSULT NOTE   Pharmacy Consult:  TPN Indication:  SBO  Patient Measurements: Height: 5\' 11"  (180.3 cm) Weight: 105 lb (47.6 kg) IBW/kg (Calculated) : 75.3 TPN AdjBW (KG): 47.6 Body mass index is 14.64 kg/m.  Assessment:  63 YOM presented to 03/02/2018 with abdominal pain 3-4 days PTA.  Found to have SBO and perforated appendicits and was taken to the OR immediately for ex-lap with appendectomy, drainage of intra-abdominal abscess, and LoA.  Pharmacy consulted to initiate TPN for nutritional support.  Patient has severe malnutrition so may be at risk for refeeding.   GI: expected post-op ileus, drain O/P 04/25/18 - Pepcid to be added to TPN Endo: no hx DM - AM glucose < 180 Insulin requirements in the past 24 hours: N/A Lytes: K 5.2, Mag 1.7 (goal >/= 2 for ileus), low CO2, others WNL Renal: AKI - SCr down 3.66 (BL SCr 0.6-0.7), BUN 89 - minimal UOP, NS at 50 ml/hr, bicarb gtt at 75 ml/hr, net +1.8L Pulm: COPD.  Intubated 10/3, FiO2 40% - Duonebs Cards: HTN - VSS Hepatobil: Hep C - LFTs WNL, tbili 1.8 Neuro: polysubstance abuse.  Fentanyl gtt, thiamine/folate to be added to TPN.  GCS 8, CPOT 0-1, RASS -4 ID: [email or web address]/[email or web address] for intra-abd abscess + MRSA bacteremia.  Afebrile, WBC 21, LA down 1.3 HIV - Biktarvy on hold, CD4 330 TPN Access: CCM to place central line TPN start date: 02/24/18  Nutritional Goals (per RD rec on 10/4): 1380 kCal and 117g protein per day  Current Nutrition:  NPO   Plan:  Initiate TPN at 25 ml/hr (goal rate ~70 ml/hr) TPN will provide 42g AA, 66g CHO and 10g ILE for a total of 488 kCal, meeting ~30% of patient's needs. Electrolytes in TPN: standard conc except no K, max acetate Daily multivitamin, trace elements, folate, thiamine and Pepcid 20mg  in TPN Start sensitive SSI Q6H NS at 50 ml/hr and bicarb gtt at 75 ml/hr per MD.  Getting saline boluses so will not reduce rate with TPN initiation. Mag sulfate 1gm IV x  1 Standard TPN labs and nursing care orders   Remo Kirschenmann D. 12/4, PharmD, BCPS, BCCCP 02/24/2018, 12:02 PM

## 2018-02-24 NOTE — Progress Notes (Signed)
Patient ID: Clemons Stepler, male   DOB: 05-Nov-1950, 67 y.o.   MRN: [id number]    1 Day Post-Op  Subjective: Stable, sedated on vent.  No pressors.  Hardly any UOP per nursing.  Bladder scan revealed on 20cc.    Objective: Vital signs in last 24 hours: Temp:  [97.6 F (36.4 C)-98.3 F (36.8 C)] 98 F (36.7 C) (10/04 0352) Pulse Rate:  [82-139] 82 (10/04 0700) Resp:  [15-35] 19 (10/04 0700) BP: (96-161)/(60-98) 113/66 (10/04 0700) SpO2:  [97 %-100 %] 100 % (10/04 0700) Arterial Line BP: (113-135)/(55-64) 132/58 (10/04 0700) FiO2 (%):  [40 %-100 %] 40 % (10/04 0352) Weight:  [47.6 kg] 47.6 kg (10/03 1025)    Intake/Output from previous day: 10/03 0701 - 10/04 0700 In: 1910.1 [I.V.:1210.1; IV Piggyback:700] Out: 360 [Urine:225; Drains:125; Blood:10] Intake/Output this shift: No intake/output data recorded.  PE: Gen: critically ill, but stable Heart: regular, not tachy Lungs: much clearer today, on vent Abd: soft, distended, -BS, midline wound is clean and packed, JP drain with serous output. GU: foley in place, but mostly blood in bag, and only about 20cc or so.  Lab Results:  Recent Labs    2018-02-25 1036 02/24/18 0556  WBC 28.0* 21.0*  HGB 11.8* 9.9*  HCT 36.2* 30.3*  PLT 436* 286   BMET Recent Labs    25-Feb-2018 2057 02/24/18 0556  NA 132* 136  K 5.5* 5.2*  CL 107 107  CO2 15* 18*  GLUCOSE 101* 124*  BUN 90* 89*  CREATININE 3.89* 3.66*  CALCIUM 8.5* 8.1*   PT/INR No results for input(s): LABPROT, INR in the last 72 hours. CMP     Component Value Date/Time   NA 136 02/24/2018 0556   K 5.2 (H) 02/24/2018 0556   CL 107 02/24/2018 0556   CO2 18 (L) 02/24/2018 0556   GLUCOSE 124 (H) 02/24/2018 0556   BUN 89 (H) 02/24/2018 0556   CREATININE 3.66 (H) 02/24/2018 0556   CREATININE 0.76 01/21/2016 1624   CALCIUM 8.1 (L) 02/24/2018 0556   PROT 6.0 (L) 2018-02-25 2057   ALBUMIN 2.0 (L) Feb 25, 2018 2057   AST 26 Feb 25, 2018 2057   ALT 9 02/25/18 2057   ALKPHOS 55 02-25-2018 2057   BILITOT 1.8 (H) 2018/02/25 2057   GFRNONAA 16 (L) 02/24/2018 0556   GFRNONAA >89 01/21/2016 1624   GFRAA 18 (L) 02/24/2018 0556   GFRAA >89 01/21/2016 1624   Lipase     Component Value Date/Time   LIPASE 20 25-Feb-2018 1036       Studies/Results: Ct Abdomen Pelvis Wo Contrast  Result Date: 02-25-2018 CLINICAL DATA:  Abdominal pain, shortness of breath and weakness. Recent pneumonia with an associated effusion and sepsis. EXAM: CT CHEST, ABDOMEN AND PELVIS WITHOUT CONTRAST TECHNIQUE: Multidetector CT imaging of the chest, abdomen and pelvis was performed following the standard protocol without IV contrast. COMPARISON:  Chest radiographs obtained earlier today. Chest CT dated 12/07/2017. FINDINGS: CT CHEST FINDINGS Cardiovascular: Atheromatous calcifications, including the coronary arteries and aorta. Mediastinum/Nodes: No enlarged mediastinal, hilar, or axillary lymph nodes. Thyroid gland, trachea, and esophagus demonstrate no significant findings. Lungs/Pleura: Moderate-sized loculated right pleural effusion laterally. Minimal non loculated pleural fluid on the right. No pleural fluid on the left. Interval multiple focal patchy and nodular opacities in both upper lobes. Mild patchy opacity and volume loss in the right lower lobe. Minimal interval patchy opacity in the left lower lobe. Extensive bilateral bullous changes. Musculoskeletal: Thoracic and lower cervical spine degenerative changes. CT ABDOMEN  PELVIS FINDINGS Hepatobiliary: 2 gallstones in the dependent portion of the gallbladder, measuring up to 7 mm in maximum diameter each. No gallbladder wall thickening or pericholecystic fluid. Unremarkable liver. Pancreas: Unremarkable. No pancreatic ductal dilatation or surrounding inflammatory changes. Spleen: Normal in size without focal abnormality. Adrenals/Urinary Tract: Dilated urinary bladder, ureters and renal collecting systems. No calculi or masses seen.  Stomach/Bowel: Moderate dilatation of the majority of the small bowel with normal caliber distal loops. There is some mesenteric rotation with an appearance suggesting an internal hernia. No bowel wall thickening or pneumatosis. Unremarkable stomach and colon. The appendix is difficult to differentiate from fluid-filled small bowel loops with no gross evidence of appendicitis. Vascular/Lymphatic: Atheromatous arterial calcifications without aneurysm. No enlarged lymph nodes. Reproductive: Moderately enlarged and partially calcified prostate gland. Other: Proximal right inguinal hernia containing fat. Musculoskeletal: Severe right hip degenerative changes. Mild-to-moderate left hip degenerative changes. Mild lumbar spine degenerative changes. IMPRESSION: 1. Complete or high-grade partial small bowel obstruction with findings suggesting that this is due to an internal hernia. 2. Interval multiple focal patchy and nodular opacities in both upper lobes, compatible with multifocal pneumonia. 3. Minimal left lower lobe pneumonia and mild to moderate right lower lobe atelectasis/pneumonia. 4. Dilated urinary bladder with associated moderate bilateral hydroureter and mild to moderate bilateral hydronephrosis. The dilated bladder may be due to bladder outlet obstruction by the patient's moderately enlarged prostate gland. 5. Decreased size of a moderate-sized loculated right pleural effusion. 6.  Calcific coronary artery and aortic atherosclerosis. 7. Extensive bilateral bullous emphysema. 8. Cholelithiasis. 9. Severe right hip degenerative changes. Aortic Atherosclerosis (ICD10-I70.0) and Emphysema (ICD10-J43.9). Electronically Signed   By: Beckie Salts M.D.   On: 02/26/2018 13:33   Dg Chest 2 View  Result Date: 03/08/2018 CLINICAL DATA:  Acute shortness of breath. EXAM: CHEST - 2 VIEW COMPARISON:  12/25/2017 and prior radiographs FINDINGS: The cardiomediastinal silhouette is unchanged. RIGHT pleural effusion which  appears loculated appears decreased in size from the prior study. RIGHT basilar atelectasis/scarring noted. No pneumothorax or new pulmonary opacities identified. No acute bony abnormalities are noted. IMPRESSION: Decreased RIGHT pleural effusion which appears loculated. Associated RIGHT basilar atelectasis/scarring. Electronically Signed   By: Harmon Pier M.D.   On: 03/03/2018 12:58   Dg Abdomen 1 View  Result Date: 03/05/2018 CLINICAL DATA:  Nasogastric tube placement.  Bowel obstruction. EXAM: ABDOMEN - 1 VIEW COMPARISON:  CT chest, abdomen and pelvis February 23, 2018. FINDINGS: Nasogastric tube tip projecting in RIGHT chest. Reticulonodular densities in the lungs with lobulated RIGHT pleural density, stable from today's CT. Multiples loops of gas distended small and large bowel in the included abdomen. Cholelithiasis. Severe degenerative changes RIGHT hip. Aortoiliac calcifications. IMPRESSION: Nasogastric tube tip projecting RIGHT chest.  Recommend removal. Critical Value/emergent results were called by telephone at the time of interpretation on 02/21/2018 at 4:36 pm to Dr. Cicero Duck RICE , who verbally acknowledged these results. Electronically Signed   By: Awilda Metro M.D.   On: 03/02/2018 16:37   Ct Chest Wo Contrast  Result Date: 03/01/2018 CLINICAL DATA:  Abdominal pain, shortness of breath and weakness. Recent pneumonia with an associated effusion and sepsis. EXAM: CT CHEST, ABDOMEN AND PELVIS WITHOUT CONTRAST TECHNIQUE: Multidetector CT imaging of the chest, abdomen and pelvis was performed following the standard protocol without IV contrast. COMPARISON:  Chest radiographs obtained earlier today. Chest CT dated 12/07/2017. FINDINGS: CT CHEST FINDINGS Cardiovascular: Atheromatous calcifications, including the coronary arteries and aorta. Mediastinum/Nodes: No enlarged mediastinal, hilar, or axillary lymph nodes. Thyroid  gland, trachea, and esophagus demonstrate no significant findings.  Lungs/Pleura: Moderate-sized loculated right pleural effusion laterally. Minimal non loculated pleural fluid on the right. No pleural fluid on the left. Interval multiple focal patchy and nodular opacities in both upper lobes. Mild patchy opacity and volume loss in the right lower lobe. Minimal interval patchy opacity in the left lower lobe. Extensive bilateral bullous changes. Musculoskeletal: Thoracic and lower cervical spine degenerative changes. CT ABDOMEN PELVIS FINDINGS Hepatobiliary: 2 gallstones in the dependent portion of the gallbladder, measuring up to 7 mm in maximum diameter each. No gallbladder wall thickening or pericholecystic fluid. Unremarkable liver. Pancreas: Unremarkable. No pancreatic ductal dilatation or surrounding inflammatory changes. Spleen: Normal in size without focal abnormality. Adrenals/Urinary Tract: Dilated urinary bladder, ureters and renal collecting systems. No calculi or masses seen. Stomach/Bowel: Moderate dilatation of the majority of the small bowel with normal caliber distal loops. There is some mesenteric rotation with an appearance suggesting an internal hernia. No bowel wall thickening or pneumatosis. Unremarkable stomach and colon. The appendix is difficult to differentiate from fluid-filled small bowel loops with no gross evidence of appendicitis. Vascular/Lymphatic: Atheromatous arterial calcifications without aneurysm. No enlarged lymph nodes. Reproductive: Moderately enlarged and partially calcified prostate gland. Other: Proximal right inguinal hernia containing fat. Musculoskeletal: Severe right hip degenerative changes. Mild-to-moderate left hip degenerative changes. Mild lumbar spine degenerative changes. IMPRESSION: 1. Complete or high-grade partial small bowel obstruction with findings suggesting that this is due to an internal hernia. 2. Interval multiple focal patchy and nodular opacities in both upper lobes, compatible with multifocal pneumonia. 3. Minimal  left lower lobe pneumonia and mild to moderate right lower lobe atelectasis/pneumonia. 4. Dilated urinary bladder with associated moderate bilateral hydroureter and mild to moderate bilateral hydronephrosis. The dilated bladder may be due to bladder outlet obstruction by the patient's moderately enlarged prostate gland. 5. Decreased size of a moderate-sized loculated right pleural effusion. 6.  Calcific coronary artery and aortic atherosclerosis. 7. Extensive bilateral bullous emphysema. 8. Cholelithiasis. 9. Severe right hip degenerative changes. Aortic Atherosclerosis (ICD10-I70.0) and Emphysema (ICD10-J43.9). Electronically Signed   By: Beckie Salts M.D.   On: 02-Mar-2018 13:33   Dg Chest Port 1 View  Result Date: Mar 02, 2018 CLINICAL DATA:  Endotracheal tube and OG tube placement. EXAM: PORTABLE CHEST 1 VIEW COMPARISON:  Film earlier this day and prior radiographs FINDINGS: An endotracheal tube is identified with tip 5.3 cm above the carina. An OG tube is present with tip overlying the proximal stomach. Cardiomediastinal silhouette is unchanged. Loculated RIGHT pleural effusion and patchy RIGHT LOWER lung opacities noted. No evidence of pneumothorax. No other changes identified. IMPRESSION: Endotracheal tube with tip 5.3 cm above the carina and OG tube with tip overlying the proximal stomach. No significant change in loculated RIGHT pleural effusion and RIGHT LOWER lung opacities from earlier today. Electronically Signed   By: Harmon Pier M.D.   On: 03-02-2018 20:18    Anti-infectives: Anti-infectives (From admission, onward)   Start     Dose/Rate Route Frequency Ordered Stop   02/25/18 1200  vancomycin (VANCOCIN) 500 mg in sodium chloride 0.9 % 100 mL IVPB     500 mg 100 mL/hr over 60 Minutes Intravenous Every 48 hours March 02, 2018 2003     02/24/18 2030  piperacillin-tazobactam (ZOSYN) IVPB 3.375 g  Status:  Discontinued     3.375 g 12.5 mL/hr over 240 Minutes Intravenous Every 8 hours 03/02/2018 1952  2018/03/02 2003   02/24/18 0400  piperacillin-tazobactam (ZOSYN) IVPB 3.375 g  Status:  Discontinued  3.375 g 12.5 mL/hr over 240 Minutes Intravenous Every 8 hours 2018/03/22 1950 03/22/18 1952   03-22-18 2030  piperacillin-tazobactam (ZOSYN) IVPB 2.25 g     2.25 g 100 mL/hr over 30 Minutes Intravenous Every 8 hours March 22, 2018 2003     03-22-18 2000  piperacillin-tazobactam (ZOSYN) IVPB 3.375 g  Status:  Discontinued     3.375 g 100 mL/hr over 30 Minutes Intravenous To Surgery 03/22/2018 1948 Mar 22, 2018 1952   2018/03/22 1930  piperacillin-tazobactam (ZOSYN) IVPB 3.375 g  Status:  Discontinued     3.375 g 100 mL/hr over 30 Minutes Intravenous To Surgery 03-22-2018 1928 03/22/18 1938   March 22, 2018 1200  vancomycin (VANCOCIN) IVPB 1000 mg/200 mL premix     1,000 mg 200 mL/hr over 60 Minutes Intravenous  Once 03-22-18 1158 22-Mar-2018 1416   03-22-18 1200  ceFEPIme (MAXIPIME) 2 g in sodium chloride 0.9 % 100 mL IVPB     2 g 200 mL/hr over 30 Minutes Intravenous  Once 03-22-2018 1158 2018/03/22 1323       Assessment/Plan Perforated appendicitis with abscess and SBO, POD 1, s/p ex lap with appendectomy, drain placement, and LOA, Dr. Janee Morn 10-3 -abdominal exam is stable.  Will start dressing changes today. -cont JP drain -cont NGT for now.  Suspect will have significant ileus -will give a couple of days, but if expect a prolonged ileus, may need TNA  VDRF/multifocal PNA with recent empyema, recurrent pleural effusion/COPD -defer to CCM  ARF/obstructive uropathy with bilateral hydroureter noted on CT yesterday -cr 3.66 today, but with hardly any UOP and what is coming out is bloody. -defer to CCM HIV Hep C EF 30% HTN H/O polysubstance abuse, current tobacco abuse  FEN - NPO/IVFs/NGT VTE - SCDs/Lovenox ID - Vanc/Zosyn 10/3   LOS: 1 day    Letha Cape , [facility] 02/24/2018, 7:31 AM Pager: [phone]

## 2018-02-24 NOTE — Progress Notes (Signed)
PHARMACY - PHYSICIAN COMMUNICATION CRITICAL VALUE ALERT - BLOOD CULTURE IDENTIFICATION (BCID)  Kevin Jimenez is an 67 y.o. male who presented to [facility] on 03/05/2018 with a chief complaint of multifocal PNA and abd pain  Assessment:  Growing MRSA in 4/4 blood cultures  Name of physician (or Provider) Contacted: Loletha Grayer  Current antibiotics: Zosyn and Vancomycin  Changes to prescribed antibiotics recommended:  Recommendations declined by provider due to multiple possible sources of infection  Results for orders placed or performed during the hospital encounter of 02/25/2018  Blood Culture ID Panel (Reflexed) (Collected: 02/25/2018 12:10 PM)  Result Value Ref Range   Enterococcus species NOT DETECTED NOT DETECTED   Listeria monocytogenes NOT DETECTED NOT DETECTED   Staphylococcus species DETECTED (A) NOT DETECTED   Staphylococcus aureus (BCID) DETECTED (A) NOT DETECTED   Methicillin resistance DETECTED (A) NOT DETECTED   Streptococcus species NOT DETECTED NOT DETECTED   Streptococcus agalactiae NOT DETECTED NOT DETECTED   Streptococcus pneumoniae NOT DETECTED NOT DETECTED   Streptococcus pyogenes NOT DETECTED NOT DETECTED   Acinetobacter baumannii NOT DETECTED NOT DETECTED   Enterobacteriaceae species NOT DETECTED NOT DETECTED   Enterobacter cloacae complex NOT DETECTED NOT DETECTED   Escherichia coli NOT DETECTED NOT DETECTED   Klebsiella oxytoca NOT DETECTED NOT DETECTED   Klebsiella pneumoniae NOT DETECTED NOT DETECTED   Proteus species NOT DETECTED NOT DETECTED   Serratia marcescens NOT DETECTED NOT DETECTED   Haemophilus influenzae NOT DETECTED NOT DETECTED   Neisseria meningitidis NOT DETECTED NOT DETECTED   Pseudomonas aeruginosa NOT DETECTED NOT DETECTED   Candida albicans NOT DETECTED NOT DETECTED   Candida glabrata NOT DETECTED NOT DETECTED   Candida krusei NOT DETECTED NOT DETECTED   Candida parapsilosis NOT DETECTED NOT DETECTED   Candida tropicalis NOT  DETECTED NOT DETECTED    Christoper Fabian, PharmD, BCPS Clinical pharmacist  **Pharmacist phone directory can now be found on [email or web address] (PW [email or web address]).  Listed under [facility]. 02/24/2018  3:08 AM

## 2018-02-24 NOTE — Progress Notes (Signed)
Initial Nutrition Assessment  DOCUMENTATION CODES:   Severe malnutrition in context of chronic illness  INTERVENTION:   TF regimen as follows: Vital High Protein @ 60 mL/hr Provides 1440 kcal, 126 g protein, and 1210 mL free water. Meets 104% kcal needs and 108% protein needs.  If unable to feed gut, recommend TPN.  NUTRITION DIAGNOSIS:   Severe Malnutrition related to chronic illness as evidenced by percent weight loss, severe fat depletion, severe muscle depletion.  GOAL:   Patient will meet greater than or equal to 90% of their needs  MONITOR:   TF tolerance, Skin, Vent status, Labs  REASON FOR ASSESSMENT:   Ventilator   ASSESSMENT:   67 yo male admitted with perforated appendicitis, small bowel obstruction, and lower abdominal peritonitis. S/p ex lap with appendectomy, JP drain in place. Complicated PMHx including COPD, HIV, Hep C, polysubstance abuse, former smoker, HTN, multifocal PNA with empyema.  Pt peacefully sedated at time of visit. In 12/2017, pt weighed 52.8 kg - current wt of 47.6 kg is down 9%. Severe malnutrition - TF recs in intervention. OGT in place. Extended ileus expected; if unable to feed gut, recommend TPN.  Meds: folic acid, thiamin; no pressors. Labs: potassium 5.2, BUN 89, Creatinine 3.66, tBilirubin 1.8, positive for S. Aureus and MRSA.  VMe: 9.4 Max temp: 36.4  Vent Mode: PRVC FiO2 (%):  [40 %-100 %] 40 % Set Rate:  [16 bmp] 16 bmp Vt Set:  [600 mL] 600 mL PEEP:  [5 cmH20] 5 cmH20 Plateau Pressure:  [17 cmH20-19 cmH20] 17 cmH20   NUTRITION - FOCUSED PHYSICAL EXAM:    Most Recent Value  Orbital Region  Severe depletion  Upper Arm Region  Severe depletion  Thoracic and Lumbar Region  Severe depletion  Buccal Region  Severe depletion  Temple Region  Severe depletion  Clavicle Bone Region  Severe depletion  Clavicle and Acromion Bone Region  Severe depletion  Scapular Bone Region  Severe depletion  Dorsal Hand  Severe depletion   Patellar Region  Severe depletion  Anterior Thigh Region  Severe depletion  Posterior Calf Region  Severe depletion  Edema (RD Assessment)  None  Hair  Reviewed  Eyes  Unable to assess  Mouth  Unable to assess  Skin  Reviewed  Nails  Reviewed     Diet Order:   Diet Order            Diet NPO time specified  Diet effective now             EDUCATION NEEDS:   Not appropriate for education at this time  Skin:  Skin Assessment: Skin Integrity Issues: Skin Integrity Issues:: Stage II, Incisions Stage II: pressure ulcer sacrum Incisions: abdominal  Last BM:  no BM  Height:   Ht Readings from Last 1 Encounters:  02/22/2018 5\' 11"  (1.803 m)   Weight:   Wt Readings from Last 1 Encounters:  02/22/2018 47.6 kg   Ideal Body Weight:  78.2 kg  BMI:  Body mass index is 14.64 kg/m.  Estimated Nutritional Needs:   Kcal:  1380 kcal/day([employer])  Protein:  117g protein/day  Fluid:  1 mL/kcal daily  04/25/18, MS, RDN, LDN On-call pager: [phone]

## 2018-02-24 NOTE — Progress Notes (Signed)
eLink Physician-Brief Progress Note Patient Name: Kevin Jimenez DOB: 08/29/1950 MRN: [id number]   Date of Service  02/24/2018  HPI/Events of Note  Oliguria - Bladder scan with 24 mL residual. No CVL or CVP. Last LVEF = 30% to 35%.  eICU Interventions  Will bolus with 0.9 NaCl 500 mL IV over 30 minutes now.      Intervention Category Intermediate Interventions: Oliguria - evaluation and management  Leea Rambeau [place] 02/24/2018, 12:29 AM

## 2018-02-24 NOTE — Progress Notes (Signed)
Central line placed by CCM. Placement verified using bedside ultrasound, verbal order received from Dr. Denese Killings that line is ready for use.

## 2018-02-24 NOTE — Procedures (Signed)
Central Venous Catheter Insertion Procedure Note Kevin Jimenez [id number] [birth year]  Procedure: Insertion of Central Venous Catheter Indications: Assessment of intravascular volume, Drug and/or fluid administration and Frequent blood sampling  Procedure Details Consent: Risks of procedure as well as the alternatives and risks of each were explained to the (patient/caregiver).  Consent for procedure obtained.   Time Out: Verified patient identification, verified procedure, site/side was marked, verified correct patient position, special equipment/implants available, medications/allergies/relevent history reviewed, required imaging and test results available.  Performed  Maximum sterile technique was used including antiseptics, cap, gloves, gown, hand hygiene, mask and sheet.  Skin prep: Chlorhexidine; local anesthetic administered A antimicrobial bonded/coated triple lumen catheter was placed in the right internal jugular vein to 17 cm using the Seldinger technique.  Evaluation Blood flow good Complications: No apparent complications Patient did tolerate procedure well. Chest X-ray ordered to verify placement.  CXR: pending.    Procedure performed under direct supervision of Dr. Denese Killings and with ultrasound guidance for real time vessel cannulation.      Kevin Brim, NP-C [facility] Pgr: [phone] or if no answer [phone] 02/24/2018, 3:00 PM

## 2018-02-25 ENCOUNTER — Inpatient Hospital Stay (HOSPITAL_COMMUNITY): Payer: Medicare Other

## 2018-02-25 DIAGNOSIS — J9601 Acute respiratory failure with hypoxia: Secondary | ICD-10-CM

## 2018-02-25 DIAGNOSIS — I361 Nonrheumatic tricuspid (valve) insufficiency: Secondary | ICD-10-CM

## 2018-02-25 LAB — POCT I-STAT 3, ART BLOOD GAS (G3+)
ACID-BASE DEFICIT: 3 mmol/L — AB (ref 0.0–2.0)
Acid-base deficit: 1 mmol/L (ref 0.0–2.0)
BICARBONATE: 24.6 mmol/L (ref 20.0–28.0)
Bicarbonate: 24.2 mmol/L (ref 20.0–28.0)
Bicarbonate: 24.4 mmol/L (ref 20.0–28.0)
O2 SAT: 96 %
O2 SAT: 89 %
O2 Saturation: 98 %
PCO2 ART: 40.8 mmHg (ref 32.0–48.0)
PCO2 ART: 37.5 mmHg (ref 32.0–48.0)
TCO2: 25 mmol/L (ref 22–32)
TCO2: 26 mmol/L (ref 22–32)
TCO2: 26 mmol/L (ref 22–32)
pCO2 arterial: 50.3 mmHg — ABNORMAL HIGH (ref 32.0–48.0)
pH, Arterial: 7.381 (ref 7.350–7.450)
pH, Arterial: 7.291 — ABNORMAL LOW (ref 7.350–7.450)
pH, Arterial: 7.42 (ref 7.350–7.450)
pO2, Arterial: 109 mmHg — ABNORMAL HIGH (ref 83.0–108.0)
pO2, Arterial: 88 mmHg (ref 83.0–108.0)
pO2, Arterial: 55 mmHg — ABNORMAL LOW (ref 83.0–108.0)

## 2018-02-25 LAB — CBC
HEMATOCRIT: 27 % — AB (ref 39.0–52.0)
HEMOGLOBIN: 8.9 g/dL — AB (ref 13.0–17.0)
MCH: 30 pg (ref 26.0–34.0)
MCHC: 33 g/dL (ref 30.0–36.0)
MCV: 90.9 fL (ref 78.0–100.0)
PLATELETS: 201 10*3/uL (ref 150–400)
RBC: 2.97 MIL/uL — AB (ref 4.22–5.81)
RDW: 16.4 % — ABNORMAL HIGH (ref 11.5–15.5)
WBC: 22.2 10*3/uL — AB (ref 4.0–10.5)

## 2018-02-25 LAB — DIFFERENTIAL
BASOS PCT: 0 %
Basophils Absolute: 0 10*3/uL (ref 0.0–0.1)
EOS PCT: 0 %
Eosinophils Absolute: 0 10*3/uL (ref 0.0–0.7)
LYMPHS PCT: 4 %
Lymphs Abs: 0.9 10*3/uL (ref 0.7–4.0)
MONO ABS: 0.2 10*3/uL (ref 0.1–1.0)
Monocytes Relative: 1 %
Neutro Abs: 21.1 10*3/uL — ABNORMAL HIGH (ref 1.7–7.7)
Neutrophils Relative %: 95 %

## 2018-02-25 LAB — ECHOCARDIOGRAM COMPLETE
HEIGHTINCHES: 71 in
WEIGHTICAEL: 1823.65 [oz_av]

## 2018-02-25 LAB — BASIC METABOLIC PANEL
Anion gap: 12 (ref 5–15)
BUN: 86 mg/dL — ABNORMAL HIGH (ref 8–23)
CO2: 21 mmol/L — ABNORMAL LOW (ref 22–32)
Calcium: 7.3 mg/dL — ABNORMAL LOW (ref 8.9–10.3)
Chloride: 104 mmol/L (ref 98–111)
Creatinine, Ser: 3.65 mg/dL — ABNORMAL HIGH (ref 0.61–1.24)
GFR calc Af Amer: 18 mL/min — ABNORMAL LOW (ref >60)
GFR calc non Af Amer: 16 mL/min — ABNORMAL LOW (ref >60)
Glucose, Bld: 124 mg/dL — ABNORMAL HIGH (ref 70–99)
Potassium: 4.4 mmol/L (ref 3.5–5.1)
Sodium: 137 mmol/L (ref 135–145)

## 2018-02-25 LAB — COMPREHENSIVE METABOLIC PANEL
ALT: 9 U/L (ref 0–44)
AST: 35 U/L (ref 15–41)
Albumin: 1.3 g/dL — ABNORMAL LOW (ref 3.5–5.0)
Alkaline Phosphatase: 51 U/L (ref 38–126)
Anion gap: 13 (ref 5–15)
BUN: 86 mg/dL — ABNORMAL HIGH (ref 8–23)
CO2: 23 mmol/L (ref 22–32)
Calcium: 7.6 mg/dL — ABNORMAL LOW (ref 8.9–10.3)
Chloride: 101 mmol/L (ref 98–111)
Creatinine, Ser: 3.65 mg/dL — ABNORMAL HIGH (ref 0.61–1.24)
GFR calc Af Amer: 18 mL/min — ABNORMAL LOW (ref >60)
GFR calc non Af Amer: 16 mL/min — ABNORMAL LOW (ref >60)
Glucose, Bld: 121 mg/dL — ABNORMAL HIGH (ref 70–99)
Potassium: 4.5 mmol/L (ref 3.5–5.1)
Sodium: 137 mmol/L (ref 135–145)
Total Bilirubin: 1.3 mg/dL — ABNORMAL HIGH (ref 0.3–1.2)
Total Protein: 4.7 g/dL — ABNORMAL LOW (ref 6.5–8.1)

## 2018-02-25 LAB — MAGNESIUM
Magnesium: 1.9 mg/dL (ref 1.7–2.4)
Magnesium: 1.8 mg/dL (ref 1.7–2.4)

## 2018-02-25 LAB — GLUCOSE, CAPILLARY
GLUCOSE-CAPILLARY: 82 mg/dL (ref 70–99)
GLUCOSE-CAPILLARY: 78 mg/dL (ref 70–99)
Glucose-Capillary: 100 mg/dL — ABNORMAL HIGH (ref 70–99)
Glucose-Capillary: 69 mg/dL — ABNORMAL LOW (ref 70–99)
Glucose-Capillary: 71 mg/dL (ref 70–99)
Glucose-Capillary: 85 mg/dL (ref 70–99)

## 2018-02-25 LAB — TRIGLYCERIDES: Triglycerides: 72 mg/dL (ref <150)

## 2018-02-25 LAB — HCV RNA QUANT
HCV Quantitative Log: 6.809 log10 IU/mL (ref >1.70)
HCV Quantitative: 6440000 IU/mL (ref >50)

## 2018-02-25 LAB — PREALBUMIN: Prealbumin: <5 mg/dL — ABNORMAL LOW (ref 18–38)

## 2018-02-25 LAB — PHOSPHORUS: Phosphorus: 3.9 mg/dL (ref 2.5–4.6)

## 2018-02-25 MED ORDER — MAGNESIUM SULFATE 2 GM/50ML IV SOLN
2.0000 g | Freq: Once | INTRAVENOUS | Status: AC
  Administered 2018-02-25: 2 g via INTRAVENOUS
  Filled 2018-02-25: qty 50

## 2018-02-25 MED ORDER — TRAVASOL 10 % IV SOLN
INTRAVENOUS | Status: AC
  Administered 2018-02-25: 18:00:00 via INTRAVENOUS
  Filled 2018-02-25: qty 840

## 2018-02-25 MED ORDER — METOPROLOL TARTRATE 25 MG/10 ML ORAL SUSPENSION
25.0000 mg | Freq: Two times a day (BID) | ORAL | Status: DC
  Administered 2018-02-25: 25 mg via ORAL
  Filled 2018-02-25 (×2): qty 10

## 2018-02-25 MED ORDER — DILTIAZEM HCL-DEXTROSE 100-5 MG/100ML-% IV SOLN (PREMIX)
5.0000 mg/h | INTRAVENOUS | Status: DC
  Administered 2018-02-25 (×2): 10 mg/h via INTRAVENOUS
  Administered 2018-02-25: 5 mg/h via INTRAVENOUS
  Filled 2018-02-25 (×4): qty 100

## 2018-02-25 NOTE — Progress Notes (Signed)
  Echocardiogram 2D Echocardiogram has been performed.  Vanna Shavers G Aletta Edmunds 02/25/2018, 2:33 PM

## 2018-02-25 NOTE — Progress Notes (Signed)
2 Days Post-Op   Subjective/Chief Complaint: Pt with no acute issues overnight   Objective: Vital signs in last 24 hours: Temp:  [97 F (36.1 C)-98.1 F (36.7 C)] 97.8 F (36.6 C) (10/05 0400) Pulse Rate:  [72-102] 73 (10/05 0740) Resp:  [16-23] 16 (10/05 0740) BP: (91-116)/(45-68) 100/58 (10/05 0740) SpO2:  [91 %-100 %] 100 % (10/05 0746) Arterial Line BP: (102-140)/(45-62) 129/53 (10/05 0600) FiO2 (%):  [40 %] 40 % (10/05 0746) Weight:  [51.7 kg] 51.7 kg (10/04 1200) Last BM Date: (PTA)  Intake/Output from previous day: 10/04 0701 - 10/05 0700 In: 6396.3 [I.V.:3724.9; IV Piggyback:2671.5] Out: 1186 [Urine:700; Drains:300; Chest Tube:186] Intake/Output this shift: No intake/output data recorded.  General appearance: sedated Resp: clear to auscultation bilaterally Cardio: regular rate and rhythm, S1, S2 normal, no murmur, click, rub or gallop and   GI: soft, drain in place-serous, midline wound c/d/i, hypoactive BS  Lab Results:  Recent Labs    02/24/18 0556 02/25/18 0535  WBC 21.0* 22.2*  HGB 9.9* 8.9*  HCT 30.3* 27.0*  PLT 286 201   BMET Recent Labs    02/25/18 0042 02/25/18 0535  NA 137 137  K 4.4 4.5  CL 104 101  CO2 21* 23  GLUCOSE 124* 121*  BUN 86* 86*  CREATININE 3.65* 3.65*  CALCIUM 7.3* 7.6*   PT/INR No results for input(s): LABPROT, INR in the last 72 hours. ABG Recent Labs    02/24/2018 2054 02/24/18 0347  PHART 7.302* 7.377  HCO3 17.6* 18.1*    Studies/Results: Ct Abdomen Pelvis Wo Contrast  Result Date: 03/02/2018 CLINICAL DATA:  Abdominal pain, shortness of breath and weakness. Recent pneumonia with an associated effusion and sepsis. EXAM: CT CHEST, ABDOMEN AND PELVIS WITHOUT CONTRAST TECHNIQUE: Multidetector CT imaging of the chest, abdomen and pelvis was performed following the standard protocol without IV contrast. COMPARISON:  Chest radiographs obtained earlier today. Chest CT dated 12/07/2017. FINDINGS: CT CHEST FINDINGS  Cardiovascular: Atheromatous calcifications, including the coronary arteries and aorta. Mediastinum/Nodes: No enlarged mediastinal, hilar, or axillary lymph nodes. Thyroid gland, trachea, and esophagus demonstrate no significant findings. Lungs/Pleura: Moderate-sized loculated right pleural effusion laterally. Minimal non loculated pleural fluid on the right. No pleural fluid on the left. Interval multiple focal patchy and nodular opacities in both upper lobes. Mild patchy opacity and volume loss in the right lower lobe. Minimal interval patchy opacity in the left lower lobe. Extensive bilateral bullous changes. Musculoskeletal: Thoracic and lower cervical spine degenerative changes. CT ABDOMEN PELVIS FINDINGS Hepatobiliary: 2 gallstones in the dependent portion of the gallbladder, measuring up to 7 mm in maximum diameter each. No gallbladder wall thickening or pericholecystic fluid. Unremarkable liver. Pancreas: Unremarkable. No pancreatic ductal dilatation or surrounding inflammatory changes. Spleen: Normal in size without focal abnormality. Adrenals/Urinary Tract: Dilated urinary bladder, ureters and renal collecting systems. No calculi or masses seen. Stomach/Bowel: Moderate dilatation of the majority of the small bowel with normal caliber distal loops. There is some mesenteric rotation with an appearance suggesting an internal hernia. No bowel wall thickening or pneumatosis. Unremarkable stomach and colon. The appendix is difficult to differentiate from fluid-filled small bowel loops with no gross evidence of appendicitis. Vascular/Lymphatic: Atheromatous arterial calcifications without aneurysm. No enlarged lymph nodes. Reproductive: Moderately enlarged and partially calcified prostate gland. Other: Proximal right inguinal hernia containing fat. Musculoskeletal: Severe right hip degenerative changes. Mild-to-moderate left hip degenerative changes. Mild lumbar spine degenerative changes. IMPRESSION: 1. Complete  or high-grade partial small bowel obstruction with findings suggesting that this  is due to an internal hernia. 2. Interval multiple focal patchy and nodular opacities in both upper lobes, compatible with multifocal pneumonia. 3. Minimal left lower lobe pneumonia and mild to moderate right lower lobe atelectasis/pneumonia. 4. Dilated urinary bladder with associated moderate bilateral hydroureter and mild to moderate bilateral hydronephrosis. The dilated bladder may be due to bladder outlet obstruction by the patient's moderately enlarged prostate gland. 5. Decreased size of a moderate-sized loculated right pleural effusion. 6.  Calcific coronary artery and aortic atherosclerosis. 7. Extensive bilateral bullous emphysema. 8. Cholelithiasis. 9. Severe right hip degenerative changes. Aortic Atherosclerosis (ICD10-I70.0) and Emphysema (ICD10-J43.9). Electronically Signed   By: Beckie Salts M.D.   On: 03/10/18 13:33   Dg Chest 2 View  Result Date: Mar 10, 2018 CLINICAL DATA:  Acute shortness of breath. EXAM: CHEST - 2 VIEW COMPARISON:  12/25/2017 and prior radiographs FINDINGS: The cardiomediastinal silhouette is unchanged. RIGHT pleural effusion which appears loculated appears decreased in size from the prior study. RIGHT basilar atelectasis/scarring noted. No pneumothorax or new pulmonary opacities identified. No acute bony abnormalities are noted. IMPRESSION: Decreased RIGHT pleural effusion which appears loculated. Associated RIGHT basilar atelectasis/scarring. Electronically Signed   By: Harmon Pier M.D.   On: March 10, 2018 12:58   Dg Abdomen 1 View  Result Date: 2018-03-10 CLINICAL DATA:  Nasogastric tube placement.  Bowel obstruction. EXAM: ABDOMEN - 1 VIEW COMPARISON:  CT chest, abdomen and pelvis 03/10/18. FINDINGS: Nasogastric tube tip projecting in RIGHT chest. Reticulonodular densities in the lungs with lobulated RIGHT pleural density, stable from today's CT. Multiples loops of gas distended small  and large bowel in the included abdomen. Cholelithiasis. Severe degenerative changes RIGHT hip. Aortoiliac calcifications. IMPRESSION: Nasogastric tube tip projecting RIGHT chest.  Recommend removal. Critical Value/emergent results were called by telephone at the time of interpretation on 2018-03-10 at 4:36 pm to Dr. Cicero Duck RICE , who verbally acknowledged these results. Electronically Signed   By: Awilda Metro M.D.   On: 2018-03-10 16:37   Ct Chest Wo Contrast  Result Date: 03/10/2018 CLINICAL DATA:  Abdominal pain, shortness of breath and weakness. Recent pneumonia with an associated effusion and sepsis. EXAM: CT CHEST, ABDOMEN AND PELVIS WITHOUT CONTRAST TECHNIQUE: Multidetector CT imaging of the chest, abdomen and pelvis was performed following the standard protocol without IV contrast. COMPARISON:  Chest radiographs obtained earlier today. Chest CT dated 12/07/2017. FINDINGS: CT CHEST FINDINGS Cardiovascular: Atheromatous calcifications, including the coronary arteries and aorta. Mediastinum/Nodes: No enlarged mediastinal, hilar, or axillary lymph nodes. Thyroid gland, trachea, and esophagus demonstrate no significant findings. Lungs/Pleura: Moderate-sized loculated right pleural effusion laterally. Minimal non loculated pleural fluid on the right. No pleural fluid on the left. Interval multiple focal patchy and nodular opacities in both upper lobes. Mild patchy opacity and volume loss in the right lower lobe. Minimal interval patchy opacity in the left lower lobe. Extensive bilateral bullous changes. Musculoskeletal: Thoracic and lower cervical spine degenerative changes. CT ABDOMEN PELVIS FINDINGS Hepatobiliary: 2 gallstones in the dependent portion of the gallbladder, measuring up to 7 mm in maximum diameter each. No gallbladder wall thickening or pericholecystic fluid. Unremarkable liver. Pancreas: Unremarkable. No pancreatic ductal dilatation or surrounding inflammatory changes. Spleen: Normal in  size without focal abnormality. Adrenals/Urinary Tract: Dilated urinary bladder, ureters and renal collecting systems. No calculi or masses seen. Stomach/Bowel: Moderate dilatation of the majority of the small bowel with normal caliber distal loops. There is some mesenteric rotation with an appearance suggesting an internal hernia. No bowel wall thickening or pneumatosis. Unremarkable stomach  and colon. The appendix is difficult to differentiate from fluid-filled small bowel loops with no gross evidence of appendicitis. Vascular/Lymphatic: Atheromatous arterial calcifications without aneurysm. No enlarged lymph nodes. Reproductive: Moderately enlarged and partially calcified prostate gland. Other: Proximal right inguinal hernia containing fat. Musculoskeletal: Severe right hip degenerative changes. Mild-to-moderate left hip degenerative changes. Mild lumbar spine degenerative changes. IMPRESSION: 1. Complete or high-grade partial small bowel obstruction with findings suggesting that this is due to an internal hernia. 2. Interval multiple focal patchy and nodular opacities in both upper lobes, compatible with multifocal pneumonia. 3. Minimal left lower lobe pneumonia and mild to moderate right lower lobe atelectasis/pneumonia. 4. Dilated urinary bladder with associated moderate bilateral hydroureter and mild to moderate bilateral hydronephrosis. The dilated bladder may be due to bladder outlet obstruction by the patient's moderately enlarged prostate gland. 5. Decreased size of a moderate-sized loculated right pleural effusion. 6.  Calcific coronary artery and aortic atherosclerosis. 7. Extensive bilateral bullous emphysema. 8. Cholelithiasis. 9. Severe right hip degenerative changes. Aortic Atherosclerosis (ICD10-I70.0) and Emphysema (ICD10-J43.9). Electronically Signed   By: Beckie Salts M.D.   On: 03/09/2018 13:33   Dg Chest Port 1 View  Result Date: 02/25/2018 CLINICAL DATA:  Acute respiratory failure with  hypoxia. EXAM: PORTABLE CHEST 1 VIEW COMPARISON:  02/24/2018. FINDINGS: Endotracheal tube in satisfactory position. Right jugular catheter tip in the superior vena cava. Nasogastric tube tip and side hole in the proximal stomach. Heart size near the upper limit of normal with a mild increase in size. No significant change in right pleural fluid and right lung airspace opacity with a small caliber pleural catheter in place. Clear left lung with stable prominence of the interstitial markings. Probable small loculated pneumothorax at the medial right lung base, occupying less than 5% of the volume of the right hemithorax. Thoracic spine degenerative changes. IMPRESSION: 1. Probable small pneumothorax at the medial right lung base. 2. Stable right pleural fluid and right lung atelectasis/pneumonia. 3. Stable chronic interstitial lung disease. Electronically Signed   By: Beckie Salts M.D.   On: 02/25/2018 07:47   Dg Chest Port 1 View  Result Date: 02/24/2018 CLINICAL DATA:  Encounter for central line and right-sided chest tube placement. EXAM: PORTABLE CHEST 1 VIEW COMPARISON:  One-view chest x-ray 02/24/2018 at 6:02 a.m. CT of the chest/3/19. FINDINGS: Patient remains intubated. NG tube courses off the inferior border the film. The loculated right pleural effusion is decreased following placement of pigtail drainage catheter. A new right IJ line is placed.  The tip is in the distal SVC. Asymmetric right apical airspace disease is again seen. IMPRESSION: 1. Slight decrease in loculated right pleural effusion following placement of pigtail drainage catheter. 2. New right IJ line without radiographic evidence for complication. 3. Stable asymmetric apical airspace disease. Electronically Signed   By: Marin Roberts M.D.   On: 02/24/2018 17:22   Portable Chest Xray  Result Date: 02/24/2018 CLINICAL DATA:  Intubation. Right-sided pneumonia with abscess/empyema. EXAM: PORTABLE CHEST 1 VIEW COMPARISON:   03/17/2018.  CT 02/22/2018. FINDINGS: Endotracheal tube and NG tube in stable position. Heart size stable. Patchy bilateral pulmonary infiltrates and large right pleural effusion, most likely loculated, again noted. No interim change. No pneumothorax. No acute bony abnormality. IMPRESSION: 1.  Lines and tubes stable position. 2. Patchy bilateral pulmonary infiltrates and large right pleural effusion, most likely loculated, again noted. No interim change. Electronically Signed   By: Maisie Fus  Register   On: 02/24/2018 08:15   Dg Chest Port 1 [address]  Result Date: 02/22/2018 CLINICAL DATA:  Endotracheal tube and OG tube placement. EXAM: PORTABLE CHEST 1 VIEW COMPARISON:  Film earlier this day and prior radiographs FINDINGS: An endotracheal tube is identified with tip 5.3 cm above the carina. An OG tube is present with tip overlying the proximal stomach. Cardiomediastinal silhouette is unchanged. Loculated RIGHT pleural effusion and patchy RIGHT LOWER lung opacities noted. No evidence of pneumothorax. No other changes identified. IMPRESSION: Endotracheal tube with tip 5.3 cm above the carina and OG tube with tip overlying the proximal stomach. No significant change in loculated RIGHT pleural effusion and RIGHT LOWER lung opacities from earlier today. Electronically Signed   By: Harmon Pier M.D.   On: 03/19/2018 20:18    Anti-infectives: Anti-infectives (From admission, onward)   Start     Dose/Rate Route Frequency Ordered Stop   02/25/18 1200  vancomycin (VANCOCIN) 500 mg in sodium chloride 0.9 % 100 mL IVPB     500 mg 100 mL/hr over 60 Minutes Intravenous Every 48 hours 03/02/2018 2003     02/24/18 2030  piperacillin-tazobactam (ZOSYN) IVPB 3.375 g  Status:  Discontinued     3.375 g 12.5 mL/hr over 240 Minutes Intravenous Every 8 hours 03/06/2018 1952 03/16/2018 2003   02/24/18 0400  piperacillin-tazobactam (ZOSYN) IVPB 3.375 g  Status:  Discontinued     3.375 g 12.5 mL/hr over 240 Minutes Intravenous Every 8  hours 03/05/2018 1950 02/22/2018 1952   03/10/2018 2030  piperacillin-tazobactam (ZOSYN) IVPB 2.25 g     2.25 g 100 mL/hr over 30 Minutes Intravenous Every 8 hours 02/25/2018 2003     02/22/2018 2000  piperacillin-tazobactam (ZOSYN) IVPB 3.375 g  Status:  Discontinued     3.375 g 100 mL/hr over 30 Minutes Intravenous To Surgery 03/04/2018 1948 03/07/2018 1952   03/02/2018 1930  piperacillin-tazobactam (ZOSYN) IVPB 3.375 g  Status:  Discontinued     3.375 g 100 mL/hr over 30 Minutes Intravenous To Surgery 03/19/2018 1928 02/28/2018 1938   03/09/2018 1200  vancomycin (VANCOCIN) IVPB 1000 mg/200 mL premix     1,000 mg 200 mL/hr over 60 Minutes Intravenous  Once 03/06/2018 1158 02/24/2018 1416   02/26/2018 1200  ceFEPIme (MAXIPIME) 2 g in sodium chloride 0.9 % 100 mL IVPB     2 g 200 mL/hr over 30 Minutes Intravenous  Once 02/26/2018 1158 03/16/2018 1323      Assessment/Plan: Perforated appendicitis with abscess and SBO, POD 2, s/p ex lap with appendectomy, drain placement, and LOA, Dr. Janee Morn 10-3 -abdominal exam is stable.  Con't WTD dsg changes. -cont JP drain -cont NGT for now.  Suspect will have prolonged ileus -on TNA  VDRF/multifocal PNA with recent empyema, recurrent pleural effusion/COPD -defer to CCM  ARF/obstructive uropathy with bilateral hydroureter noted on CT yesterday -cr 3.65 today, defer to CCM HIV Hep C EF 30% HTN H/O polysubstance abuse, current tobacco abuse  FEN - NPO/IVFs/NGT VTE - SCDs/Lovenox ID - Vanc/Zosyn 10/3   LOS: 2 days    Axel Filler 02/25/2018

## 2018-02-25 NOTE — Care Management Note (Addendum)
Case Management Note  Patient Details  Name: Kevin Jimenez MRN: [id number] Date of Birth: 07-19-1950  Subjective/Objective:      Pt from home with mother.  Pt active with [facility] RN, PT, OT.   Apparently, patient had been ill x 4 days before seeking care and had had a ruptured appendix for some amount of time.  Pt also has bladder outlet obstruction causing hydroureter and required Foley for bladder drainage.  D/W with Tiffany, liaison for [facility], who will investigate [facility] services provided to patient during the last week.  Pt with admission in 7/19 and dc to [facility] after pneumonia and empyema.  Pt admits to current drug use per admission notes.            Action/Plan: CM will continue to follow.  Expected Discharge Date:                  Expected Discharge Plan:     In-House Referral:     Discharge planning Services     Post Acute Care Choice:    Choice offered to:     DME Arranged:    DME Agency:     HH Arranged:    HH Agency:  [facility]  Status of Service:  In process, will continue to follow  If discussed at Long Length of Stay Meetings, dates discussed:    Additional Comments: Update: 02/25/09 1500  [facility] staff had visited patient during the 4 days prior to his admission.  He c/o abdominal pain at that time and they recommended that he to go to MD or hospital.  Pt refused and told them he doesn't like how he is treated at the hospital due to his HIV.  There are no other known barriers to health care as patient has insurance, transportation, and visiting home health staff.    Deveron Furlong, RN 02/25/2018, 9:21 AM

## 2018-02-25 NOTE — Progress Notes (Signed)
eLink Physician-Brief Progress Note Patient Name: Kevin Jimenez DOB: [birth year] MRN: [id number]   Date of Service  02/25/2018  HPI/Events of Note  Intermittent episodes of AF/RVR with HR in the 150s.  On diltiazem and BB at home  eICU Interventions  Order for diltiazem gtt and BB via OGT Check BMET/Mag     Intervention Category Intermediate Interventions: Arrhythmia - evaluation and management  Auden Wettstein 02/25/2018, 12:18 AM

## 2018-02-25 NOTE — Progress Notes (Addendum)
NAME:  Kevin Jimenez, MRN:  [id number], DOB:  08-20-1950, LOS: 2 ADMISSION DATE:  03/09/18, CONSULTATION DATE:  2018-03-09 REFERRING MD:  Corliss Skains CHIEF COMPLAINT:  Abd pain   Brief History   Kevin Jimenez is a 67 y.o. male who was admitted 10/3 with SBO and perforated appendicitis.  He was taken to the OR where he had ex-lap, appendectomy, drainage of intraabdominal abscess, lysis of adhesions with release of SBO. Post operatively, he remained ventilated; therefore, PCCM was asked to assist with vent management.  Significant Hospital Events   10/3 Admit.  Consults: date of consult/date signed off & final recs:  Surgery 10/3 > admit. PCCM 10/3  Procedures (surgical and bedside):  ETT 10/3 > Radial art line 10/3 >  RLQ JP drain 10/3 >  Right pigtail chest tube>>  Significant Diagnostic Tests:  CT chest / abd / pelv 10/3 > complete or high grade SBO likely due to an internal hernia, multifocal PNA, dilated bladder with mod bilateral hydroureter and mild to mod bilateral hydronephrosis, small loculated right pleural effusion, cholelithiasis.  Micro Data: Blood 10/3 >> GPC's in clusters 2/2 >> MRSA>> BCID 10/2 >> staph species  Urine 10/3 >  Abd fluid 10/3 >> few staph >>   Antimicrobials:  Vanc 10/3 >>  Zosyn 10/3 >>  Subjective:  Bladder scan reveals 19 cc  Objective   Blood pressure (!) 93/52, pulse 76, temperature (!) 97.1 F (36.2 C), temperature source Axillary, resp. rate 16, height 5\' 11"  (1.803 m), weight 51.7 kg, SpO2 100 %.    Vent Mode: PRVC FiO2 (%):  [40 %] 40 % Set Rate:  [16 bmp] 16 bmp Vt Set:  [600 mL] 600 mL PEEP:  [5 cmH20] 5 cmH20 Plateau Pressure:  [18 cmH20-23 cmH20] 21 cmH20   Intake/Output Summary (Last 24 hours) at 02/25/2018 0858 Last data filed at 02/25/2018 0700 Gross per 24 hour  Intake 6296.97 ml  Output 916 ml  Net 5380.97 ml   Filed Weights   03-09-18 1025 02/24/18 1200  Weight: 47.6 kg 51.7 kg    Examination: General: chronically  ill appearing male in NAD on vent   HEENT: MM pink/moist, ETT, temporal wasting, poor dentition Neuro: awakens with stimulation but drifts back to sleep, reaches for ETT CV: s1s2 rrr, no m/r/g PULM: even/non-labored, clear on left, diminished on right, right chest tube to waterseal 20 cm without air leak GI: distended, midline dressing c/d/i, JP drain with yellow/pink drainage Extremities: warm/dry, 1+ BLE pitting edema  Skin: no rashes or lesions  Assessment & Plan:   SBO with peritonitis and perforated appendicitis - s/p ex-lap, appendectomy, drainage of intraabdominal abscess, lysis of adhesions with release of SBO. P: Post operative care per CCS Continue abx as above. If decompensates, consider addition of antifungal  CCS expects prolonged ileus > place central access    Acute Respiratory Insufficiency - s/p intubation prior to OR. P: PRVC 8cc/kg  PSV as able  Follow intermittent CXR    Right Loculated Pleural Effusion - hx of empyema in 11/2017 s/p pigtail chest tube per CVTS, cultures negative at that time.  P: Assess pleural space with [place]  Right chest tube drainage, tPA + pulmozyme currently being infused 2 times a day   Sedation needs due to mechanical ventilation P: Fentanyl gtt for pain  PRN versed  RASS Goal: 0 to -1  Daily WUA    Multifocal PNA. P: ABX as above  Follow CXR    Sepsis / GPC Bacteremia [name]  p:  Antibiotics per infectious disease consult currently on vancomycin and Zosyn day 2   AKI - Presumed post-renal from bladder outlet obstruction secondary to enlarged prostate. AGMA - due to renal failure + lactate. Hypomagnesemia  Lab Results  Component Value Date   CREATININE 3.65 (H) 02/25/2018   CREATININE 3.65 (H) 02/25/2018   CREATININE 3.66 (H) 02/24/2018   CREATININE 0.76 01/21/2016   CREATININE 0.80 05/15/2015   CREATININE 0.73 03/27/2015   Recent Labs  Lab 02/24/18 0556 02/25/18 0042 02/25/18 0535  K 5.2* 4.4 4.5     P: Currently on bicarb drip at 75 cc an hour Monitor electrolytes Foley  needs to remain in place due to bladder outlet obstruction per urology.  Will need Flomax once able to take p.o.'s Questionable renal consult for increasing creatinine Recheck bladder scan to ensure proper bladder   Hyponatremia Recent Labs  Lab 02/24/18 0556 02/25/18 0042 02/25/18 0535  NA 136 137 137    - suspect hypovolemic. P:  Resolved with normal sinus   Obstructive uropathy with bilateral hydroureter and hydronephrosis - reportedly seen by urology in ED but no notes in system yet. 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 P:  Continue thiamine folic acid Counseling if he survives   Hx sCHF (Echo from 12/10/17 with EF 30 - 35%), HTN. Developed tachycardia 02/25/2018 P: Placed on Cardizem Beta-blockers restarted 02/25/2018   Disposition / Summary of Today's Plan 02/25/18   Continues on full mechanical ventilatory support ID is controlled antibiotics Right chest tube remains in place with a 5% pneumothorax on right Listed as a full DNR Developed tachycardia during the night was placed on beta-blockers calcium channel blocker Bladder outlet obstruction addressed with Foley bladder scan 02/25/2018 reveals 19 cc Right pigtail chest tube being infused with TPA twice a day without air leak    Diet: NPO. Pain/Anxiety/Delirium protocol (if indicated): fentanyl gtt / midazolam PRN. VAP protocol (if indicated): In place. DVT prophylaxis: SCD's. GI prophylaxis: Famotidine. Hyperglycemia protocol: N/A. Mobility: Bedrest. Code Status: Full - NOTE, on prior admit 7/17 pt was listed as DNR.  Attempted to reach pt's mother tonight to clarify code status; however, no answer. Family  Communication: Attempted to reach pt's mother over the phone but no answer.  02/25/2018 no family at bedside  Labs   CBC: Recent Labs  Lab 03/05/2018 1036 02/24/18 0556 02/25/18 0535  WBC 28.0* 21.0* 22.2*  NEUTROABS  --  20.2* 21.1*  HGB 11.8* 9.9* 8.9*  HCT 36.2* 30.3* 27.0*  MCV 91.9 91.5 90.9  PLT 436* 286 201   Basic Metabolic Panel: Recent Labs  Lab 03/03/2018 1036 03/10/2018 2057 02/24/18 0556 02/25/18 0042 02/25/18 0535  NA 132* 132* 136 137 137  K 5.4* 5.5* 5.2* 4.4 4.5  CL 103 107 107 104 101  CO2 14* 15* 18* 21* 23  GLUCOSE 113* 101* 124* 124* 121*  BUN 87* 90* 89* 86* 86*  CREATININE 4.19* 3.89* 3.66* 3.65* 3.65*  CALCIUM 9.4 8.5* 8.1* 7.3* 7.6*  MG  --   --  1.7 1.9 1.8  PHOS  --   --  3.6  --  3.9   GFR: Estimated Creatinine Clearance: 14.4 mL/min (A) (by C-G formula based on SCr  of 3.65 mg/dL (H)). Recent Labs  Lab 02/25/2018 1036 02/24/2018 1222 03/18/2018 1452 03/22/2018 2030 02/24/18 0556 02/25/18 0535  WBC 28.0*  --   --   --  21.0* 22.2*  LATICACIDVEN  --  3.03* 2.50* 1.3  --   --    Liver Function Tests: Recent Labs  Lab 02/24/2018 1036 03/12/2018 2057 02/25/18 0535  AST 31 26 35  ALT 12 9 9   ALKPHOS 89 55 51  BILITOT 1.6* 1.8* 1.3*  PROT 7.2 6.0* 4.7*  ALBUMIN 2.1* 2.0* 1.3*   Recent Labs  Lab 02/21/2018 1036  LIPASE 20   No results for input(s): AMMONIA in the last 168 hours. ABG    Component Value Date/Time   PHART 7.377 02/24/2018 0347   PCO2ART 30.7 (L) 02/24/2018 0347   PO2ART 281.0 (H) 02/24/2018 0347   HCO3 18.1 (L) 02/24/2018 0347   TCO2 19 (L) 02/24/2018 0347   ACIDBASEDEF 6.0 (H) 02/24/2018 0347   O2SAT 100.0 02/24/2018 0347    Coagulation Profile: No results for input(s): INR, PROTIME in the last 168 hours.   Cardiac Enzymes: No results for input(s): CKTOTAL, CKMB, CKMBINDEX, TROPONINI in the last 168 hours.   HbA1C: No results found for: HGBA1C   CBG: Recent Labs  Lab 02/24/18 1213 02/24/18 1642 02/24/18 2309  02/25/18 0353  GLUCAP 92 82 90 85      App cct 30 min   Steve Minor ACNP 04/27/18 PCCM Pager [phone] till 1 pm If no answer page [phone] 02/25/2018, 8:58 AM  Attending Note:  67 year old male s/p ex-lap for perforated appendicitis who presents to PCCM with post op respiratory failure.  Patient also has an empyema and has been receiving tPA and DNAse.  On exam, sedate, not following commands with diffusely coarse BS.  I reviewed CXR myself, ETT and CT are in place.  Discussed with PCCM-NP.  Will continue abx for now.  If deteriorates will consider anti-fungal given presentation.  Surgical care per CCS.  Maintain on full vent support for now.  Will place tPA tonight.  PCCM will continue to follow.  The patient is critically ill with multiple organ systems failure and requires high complexity decision making for assessment and support, frequent evaluation and titration of therapies, application of advanced monitoring technologies and extensive interpretation of multiple databases.   Critical Care Time devoted to patient care services described in this note is  34  Minutes. This time reflects time of care of this signee Dr 79. This critical care time does not reflect procedure time, or teaching time or supervisory time of PA/NP/Med student/Med Resident etc but could involve care discussion time.  Koren Bound, M.D. [facility] Pulmonary/Critical Care Medicine. Pager: [phone]. After hours pager: [phone].

## 2018-02-25 NOTE — Progress Notes (Signed)
RT called to pt's room due to pt's SATS dropping in the 80's. Pt was agitated upon RT arrival. Pt was bag lavaged and a big mucus plug was suction from ETT.   Pt back on full support on previous settings and look comfortable. Spo2 in the upper 90's. Will continue to monitor.

## 2018-02-25 NOTE — Progress Notes (Signed)
Patient ID: Kevin Jimenez, male   DOB: 1950/06/18, 67 y.o.   MRN: [id number]         [facility]  Date of Admission:  03/07/2018           Day 3 vancomycin        Day 3 piperacillin tazobactam ASSESSMENT: He had a ruptured appendicitis complicated by abscess, peritonitis and MRSA bacteremia.  MRSA is the only pathogen isolated that would continue piperacillin tazobactam in addition to vancomycin to cover GI pathogens.  PLAN: 1. Continue current antibiotics 2. Await results of repeat blood cultures and TTE  Principal Problem:   MRSA bacteremia Active Problems:   Sepsis ([place])   Peritonitis ([place])   Pelvic abscess in male [facility])   Ruptured appendicitis   HIV (+) Human immunodeficiency virus disease ([place])   Chronic hepatitis C without hepatic coma ([place])   History of substance abuse ([place])   Tobacco abuse   Protein-calorie malnutrition, severe   SBO (small bowel obstruction) ([place])   Acute renal failure (ARF) ([place])   Metabolic acidosis   Endotracheally intubated   Scheduled Meds: . alteplase (TPA) for intrapleural administration  10 mg Intrapleural Q12H   And  . pulmozyme (DORNASE) for intrapleural administration  5 mg Intrapleural Q12H  . chlorhexidine gluconate (MEDLINE KIT)  15 mL Mouth Rinse BID  . Chlorhexidine Gluconate Cloth  6 each Topical Q0600  . heparin injection (subcutaneous)  5,000 Units Subcutaneous Q8H  . insulin aspart  0-9 Units Subcutaneous Q6H  . ipratropium-albuterol  3 mL Nebulization Q6H  . mouth rinse  15 mL Mouth Rinse 10 times per day  . metoprolol tartrate  25 mg Oral BID  . mupirocin ointment  1 application Nasal BID   Continuous Infusions: . sodium chloride 50 mL/hr at 02/25/18 1000  . sodium chloride Stopped (02/25/18 0914)  . diltiazem (CARDIZEM) infusion 10 mg/hr (02/25/18 1000)  . fentaNYL infusion INTRAVENOUS 100 mcg/hr (02/25/18 1000)  . piperacillin-tazobactam (ZOSYN)  IV Stopped (02/25/18 0554)  .  sodium  bicarbonate  infusion 1000 mL 75 mL/hr at 02/25/18 1000  . TPN ADULT (ION) 25 mL/hr at 02/25/18 1000  . TPN ADULT (ION)    . vancomycin     PRN Meds:.sodium chloride, fentaNYL, midazolam, midazolam  Review of Systems: Review of Systems  Unable to perform ROS: Intubated    No Known Allergies  OBJECTIVE: Vitals:   02/25/18 0919 02/25/18 1136 02/25/18 1137 02/25/18 1200  BP: (!) 99/57 107/66    Pulse:  68    Resp:  16    Temp:    98 F (36.7 C)  TempSrc:    Axillary  SpO2:  100% 100%   Weight:      Height:       Body mass index is 15.9 kg/m.  Physical Exam  Constitutional:  He remains unresponsive on the ventilator.  Cardiovascular: Normal rate, regular rhythm and normal heart sounds.  Pulmonary/Chest: Effort normal and breath sounds normal.  Abdominal: Soft.  Bandage over midline incision.    Lab Results Lab Results  Component Value Date   WBC 22.2 (H) 02/25/2018   HGB 8.9 (L) 02/25/2018   HCT 27.0 (L) 02/25/2018   MCV 90.9 02/25/2018   PLT 201 02/25/2018    Lab Results  Component Value Date   CREATININE 3.65 (H) 02/25/2018   BUN 86 (H) 02/25/2018   NA 137 02/25/2018   K 4.5 02/25/2018   CL 101 02/25/2018   CO2 23  02/25/2018    Lab Results  Component Value Date   ALT 9 02/25/2018   AST 35 02/25/2018   ALKPHOS 51 02/25/2018   BILITOT 1.3 (H) 02/25/2018     Microbiology: Recent Results (from the past 240 hour(s))  Blood Culture (routine x 2)     Status: Abnormal (Preliminary result)   Collection Time: 03/10/2018 11:58 AM  Result Value Ref Range Status   Specimen Description BLOOD RIGHT ANTECUBITAL  Final   Special Requests   Final    BOTTLES DRAWN AEROBIC AND ANAEROBIC Blood Culture adequate volume   Culture  Setup Time   Final    GRAM POSITIVE COCCI IN CLUSTERS IN BOTH AEROBIC AND ANAEROBIC BOTTLES CRITICAL VALUE NOTED.  VALUE IS CONSISTENT WITH PREVIOUSLY REPORTED AND CALLED VALUE. Performed at [facility], [place] [address],  [place], [place] [address]    Culture STAPHYLOCOCCUS AUREUS (A)  Final   Report Status PENDING  Incomplete  Blood Culture (routine x 2)     Status: Abnormal (Preliminary result)   Collection Time: 03/03/2018 12:10 PM  Result Value Ref Range Status   Specimen Description BLOOD LEFT ANTECUBITAL  Final   Special Requests   Final    BOTTLES DRAWN AEROBIC AND ANAEROBIC Blood Culture adequate volume   Culture  Setup Time   Final    GRAM POSITIVE COCCI IN CLUSTERS IN BOTH AEROBIC AND ANAEROBIC BOTTLES CRITICAL RESULT CALLED TO, READ BACK BY AND VERIFIED WITH: K.AMEND,PHARMD AT 4034 ON 02/24/18 BY G.MCADOO    Culture (A)  Final    STAPHYLOCOCCUS AUREUS SUSCEPTIBILITIES TO FOLLOW Performed at [facility], [place] [address], [place], [place] [address]    Report Status PENDING  Incomplete  Blood Culture ID Panel (Reflexed)     Status: Abnormal   Collection Time: 03/17/2018 12:10 PM  Result Value Ref Range Status   Enterococcus species NOT DETECTED NOT DETECTED Final   Listeria monocytogenes NOT DETECTED NOT DETECTED Final   Staphylococcus species DETECTED (A) NOT DETECTED Final    Comment: CRITICAL RESULT CALLED TO, READ BACK BY AND VERIFIED WITH: K.AMEND,PHARMD AT 0250 ON 02/24/18 BY G.MCADOO    Staphylococcus aureus (BCID) DETECTED (A) NOT DETECTED Final    Comment: Methicillin (oxacillin)-resistant Staphylococcus aureus (MRSA). MRSA is predictably resistant to beta-lactam antibiotics (except ceftaroline). Preferred therapy is vancomycin unless clinically contraindicated. Patient requires contact precautions if  hospitalized. CRITICAL RESULT CALLED TO, READ BACK BY AND VERIFIED WITH: K.AMEND,PHARMD AT 0250 ON 02/24/18 BY G.MCADOO    Methicillin resistance DETECTED (A) NOT DETECTED Final    Comment: CRITICAL RESULT CALLED TO, READ BACK BY AND VERIFIED WITH: K.AMEND,PHARMD AT 0250 ON 02/24/18 BY G.MCADOO    Streptococcus species NOT DETECTED NOT DETECTED Final   Streptococcus agalactiae NOT  DETECTED NOT DETECTED Final   Streptococcus pneumoniae NOT DETECTED NOT DETECTED Final   Streptococcus pyogenes NOT DETECTED NOT DETECTED Final   Acinetobacter baumannii NOT DETECTED NOT DETECTED Final   Enterobacteriaceae species NOT DETECTED NOT DETECTED Final   Enterobacter cloacae complex NOT DETECTED NOT DETECTED Final   Escherichia coli NOT DETECTED NOT DETECTED Final   Klebsiella oxytoca NOT DETECTED NOT DETECTED Final   Klebsiella pneumoniae NOT DETECTED NOT DETECTED Final   Proteus species NOT DETECTED NOT DETECTED Final   Serratia marcescens NOT DETECTED NOT DETECTED Final   Haemophilus influenzae NOT DETECTED NOT DETECTED Final   Neisseria meningitidis NOT DETECTED NOT DETECTED Final   Pseudomonas aeruginosa NOT DETECTED NOT DETECTED Final   Candida  albicans NOT DETECTED NOT DETECTED Final   Candida glabrata NOT DETECTED NOT DETECTED Final   Candida krusei NOT DETECTED NOT DETECTED Final   Candida parapsilosis NOT DETECTED NOT DETECTED Final   Candida tropicalis NOT DETECTED NOT DETECTED Final    Comment: Performed at [facility], [place] [address] [place], [place] [address]  Aerobic/Anaerobic Culture (surgical/deep wound)     Status: None (Preliminary result)   Collection Time: 03/23/2018  6:38 PM  Result Value Ref Range Status   Specimen Description FLUID PERITONEAL  Final   Special Requests SWAB PT ON VANC,MAXIPIME  Final   Gram Stain   Final    MODERATE WBC PRESENT, PREDOMINANTLY PMN FEW GRAM POSITIVE COCCI    Culture   Final    FEW STAPHYLOCOCCUS AUREUS CULTURE REINCUBATED FOR BETTER GROWTH Performed at [facility], [place] [address] [place], [place] [address]    Report Status PENDING  Incomplete  MRSA PCR Screening     Status: Abnormal   Collection Time: 03/16/2018  8:38 PM  Result Value Ref Range Status   MRSA by PCR POSITIVE (A) NEGATIVE Final    Comment:        The GeneXpert MRSA Assay (FDA approved for NASAL specimens only), is one component of  a comprehensive MRSA colonization surveillance program. It is not intended to diagnose MRSA infection nor to guide or monitor treatment for MRSA infections. RESULT CALLED TO, READ BACK BY AND VERIFIED WITH: ARMSTRONG,J RN 2302 03/03/2018 MITCHELL,L Performed at [facility], [place] [address] [place], [place] [address]   Body fluid culture     Status: None (Preliminary result)   Collection Time: 02/24/18  3:56 PM  Result Value Ref Range Status   Specimen Description PLEURAL  Final   Special Requests Immunocompromised  Final   Gram Stain   Final    FEW WBC PRESENT, PREDOMINANTLY PMN NO ORGANISMS SEEN    Culture   Final    NO GROWTH < 24 HOURS Performed at [place] [address], [place], [place] [address]    Report Status PENDING  Incomplete    Michel Bickers, MD [facility] [phone] pager   [phone] cell 02/25/2018, 12:35 PM

## 2018-02-25 NOTE — Progress Notes (Addendum)
ABG drawn, abnormal results given to NP.  RR increased to 20 per ABG results.

## 2018-02-25 NOTE — Progress Notes (Addendum)
ABG drawn.  FI02 increased to 60% per Pa02 results.

## 2018-02-25 NOTE — Progress Notes (Signed)
PHARMACY - ADULT TOTAL PARENTERAL NUTRITION CONSULT NOTE   Pharmacy Consult:  TPN Indication:  SBO  Patient Measurements: Height: 5\' 11"  (180.3 cm) Weight: 113 lb 15.7 oz (51.7 kg) IBW/kg (Calculated) : 75.3 TPN AdjBW (KG): 47.6 Body mass index is 15.9 kg/m.  Assessment:  59 YOM presented to 03/08/2018 with abdominal pain 3-4 days PTA.  Found to have SBO and perforated appendicits and was taken to the OR immediately for ex-lap with appendectomy, drainage of intra-abdominal abscess, and LoA.  Pharmacy consulted to initiate TPN for nutritional support.  Patient has severe malnutrition.  GI: expected post-op ileus, BL prealbumin low at < 5, drain O/P 04/25/18 (up) - Pepcid in TPN Endo: no hx DM - CBGs < 180 Insulin requirements in the past 24 hours: 0 unit Lytes: K down to 4.5, Mag 1.8 (goal >/= 2 for ileus), others WNL Renal: AKI - SCr down 3.65 (BL SCr 0.6-0.7), BUN 86 - UOP 0.6 ml/kg/hr, NS at 50 ml/hr, bicarb gtt at 75 ml/hr, net +6.7L Pulm: COPD.  Intubated 10/3, FiO2 40%, CT placed 10/4 and O/P - Duonebs, Pulmozyme Cards: HTN - BP/HR low normal, converted to NSR - Lopressor, diltiazem gtt Hepatobil: Hep C - LFTs WNL, tbili down to 1.3, TG WNL Neuro: polysubstance abuse.  Fentanyl gtt, thiamine/folate in TPN.  GCS 10, CPOT 0-2, RASS -2 ID: Vanc/Zosyn for intra-abd abscess + MRSA bacteremia.  Afebrile, WBC up 22.2, LA down 1.3 HIV - Biktarvy on hold, CD4 330 TPN Access: CVC triple lumen placed 02/24/18 TPN start date: 02/24/18  Nutritional Goals (per RD rec on 10/4): 1380 kCal and 117g protein per day  Current Nutrition:  TPN   Plan:  Increase TPN to 50 ml/hr (goal rate 70 ml/hr) TPN will provide 84g AA, 108g CHO and 29g ILE for a total of 991 kCal, meeting ~71% of patient's needs. Electrolytes in TPN: no K for another day, increase Mag, max acetate - lytes fluctuation with increasing TPN rate Daily multivitamin, trace elements, folate, thiamine and Pepcid 20mg  in  TPN Continue sensitive SSI Q6H NS at 50 ml/hr and bicarb gtt at 75 ml/hr per MD.  Monitor volume status/SCr and consider reducing rate with TPN up-titration. Mag sulfate 2gm IV x 1 F/U AM labs   Katiana Ruland D. , PharmD, BCPS, BCCCP 02/25/2018, 8:02 AM

## 2018-02-26 ENCOUNTER — Inpatient Hospital Stay (HOSPITAL_COMMUNITY): Payer: Medicare Other

## 2018-02-26 DIAGNOSIS — Z978 Presence of other specified devices: Secondary | ICD-10-CM

## 2018-02-26 DIAGNOSIS — R931 Abnormal findings on diagnostic imaging of heart and coronary circulation: Secondary | ICD-10-CM

## 2018-02-26 DIAGNOSIS — J869 Pyothorax without fistula: Secondary | ICD-10-CM

## 2018-02-26 LAB — POCT I-STAT 3, ART BLOOD GAS (G3+)
ACID-BASE DEFICIT: 2 mmol/L (ref 0.0–2.0)
Bicarbonate: 25.5 mmol/L (ref 20.0–28.0)
Bicarbonate: 26.8 mmol/L (ref 20.0–28.0)
Bicarbonate: 26.1 mmol/L (ref 20.0–28.0)
O2 SAT: 99 %
O2 Saturation: 98 %
O2 Saturation: 98 %
PCO2 ART: 53.9 mmHg — AB (ref 32.0–48.0)
PCO2 ART: 49.1 mmHg — AB (ref 32.0–48.0)
PH ART: 7.292 — AB (ref 7.350–7.450)
PH ART: 7.301 — AB (ref 7.350–7.450)
PO2 ART: 122 mmHg — AB (ref 83.0–108.0)
Patient temperature: 97.6
TCO2: 27 mmol/L (ref 22–32)
TCO2: 28 mmol/L (ref 22–32)
TCO2: 28 mmol/L (ref 22–32)
pCO2 arterial: 52.3 mmHg — ABNORMAL HIGH (ref 32.0–48.0)
pH, Arterial: 7.334 — ABNORMAL LOW (ref 7.350–7.450)
pO2, Arterial: 117 mmHg — ABNORMAL HIGH (ref 83.0–108.0)
pO2, Arterial: 124 mmHg — ABNORMAL HIGH (ref 83.0–108.0)

## 2018-02-26 LAB — BASIC METABOLIC PANEL
Anion gap: 11 (ref 5–15)
BUN: 87 mg/dL — ABNORMAL HIGH (ref 8–23)
CO2: 26 mmol/L (ref 22–32)
CREATININE: 3.39 mg/dL — AB (ref 0.61–1.24)
Calcium: 7.2 mg/dL — ABNORMAL LOW (ref 8.9–10.3)
Chloride: 100 mmol/L (ref 98–111)
GFR calc Af Amer: 20 mL/min — ABNORMAL LOW (ref >60)
GFR, EST NON AFRICAN AMERICAN: 17 mL/min — AB (ref >60)
GLUCOSE: 131 mg/dL — AB (ref 70–99)
Potassium: 4 mmol/L (ref 3.5–5.1)
SODIUM: 137 mmol/L (ref 135–145)

## 2018-02-26 LAB — CBC WITH DIFFERENTIAL/PLATELET
BASOS PCT: 0 %
Basophils Absolute: 0 10*3/uL (ref 0.0–0.1)
EOS PCT: 0 %
Eosinophils Absolute: 0 10*3/uL (ref 0.0–0.7)
HEMATOCRIT: 26.9 % — AB (ref 39.0–52.0)
Hemoglobin: 8.8 g/dL — ABNORMAL LOW (ref 13.0–17.0)
LYMPHS PCT: 4 %
Lymphs Abs: 1.1 10*3/uL (ref 0.7–4.0)
MCH: 30.1 pg (ref 26.0–34.0)
MCHC: 32.7 g/dL (ref 30.0–36.0)
MCV: 92.1 fL (ref 78.0–100.0)
MONO ABS: 0.3 10*3/uL (ref 0.1–1.0)
Monocytes Relative: 1 %
NEUTROS PCT: 95 %
Neutro Abs: 26 10*3/uL — ABNORMAL HIGH (ref 1.7–7.7)
PLATELETS: 150 10*3/uL (ref 150–400)
RBC: 2.92 MIL/uL — AB (ref 4.22–5.81)
RDW: 16.9 % — AB (ref 11.5–15.5)
WBC: 27.4 10*3/uL — AB (ref 4.0–10.5)

## 2018-02-26 LAB — HIV-1 RNA QUANT-NO REFLEX-BLD: LOG10 HIV-1 RNA: UNDETERMINED {Log_copies}/mL

## 2018-02-26 LAB — GLUCOSE, CAPILLARY
GLUCOSE-CAPILLARY: 55 mg/dL — AB (ref 70–99)
Glucose-Capillary: 111 mg/dL — ABNORMAL HIGH (ref 70–99)
Glucose-Capillary: 110 mg/dL — ABNORMAL HIGH (ref 70–99)
Glucose-Capillary: 96 mg/dL (ref 70–99)

## 2018-02-26 LAB — CULTURE, BLOOD (ROUTINE X 2)
SPECIAL REQUESTS: ADEQUATE
Special Requests: ADEQUATE

## 2018-02-26 LAB — PHOSPHORUS: Phosphorus: 5.3 mg/dL — ABNORMAL HIGH (ref 2.5–4.6)

## 2018-02-26 LAB — MAGNESIUM: Magnesium: 2.3 mg/dL (ref 1.7–2.4)

## 2018-02-26 MED ORDER — DEXTROSE 50 % IV SOLN
INTRAVENOUS | Status: AC
  Filled 2018-02-26: qty 50

## 2018-02-26 MED ORDER — NOREPINEPHRINE 4 MG/250ML-% IV SOLN
0.0000 ug/min | INTRAVENOUS | Status: DC
  Administered 2018-02-26: 2 ug/min via INTRAVENOUS
  Filled 2018-02-26 (×2): qty 250

## 2018-02-26 MED ORDER — TRAVASOL 10 % IV SOLN
INTRAVENOUS | Status: AC
  Administered 2018-02-26: 18:00:00 via INTRAVENOUS
  Filled 2018-02-26: qty 1159.2

## 2018-02-26 MED ORDER — PHENYLEPHRINE HCL-NACL 40-0.9 MG/250ML-% IV SOLN
0.0000 ug/min | INTRAVENOUS | Status: DC
  Administered 2018-02-26: 70 ug/min via INTRAVENOUS
  Administered 2018-02-27 (×2): 120 ug/min via INTRAVENOUS
  Filled 2018-02-26 (×4): qty 250

## 2018-02-26 MED ORDER — STERILE WATER FOR INJECTION IJ SOLN
5.0000 mg | Freq: Two times a day (BID) | RESPIRATORY_TRACT | Status: DC
  Administered 2018-02-26 (×2): 5 mg via INTRAPLEURAL
  Filled 2018-02-26 (×3): qty 5

## 2018-02-26 MED ORDER — SODIUM CHLORIDE 0.9 % IJ SOLN
10.0000 mg | Freq: Two times a day (BID) | INTRAMUSCULAR | Status: DC
  Administered 2018-02-26 (×2): 10 mg via INTRAPLEURAL
  Filled 2018-02-26 (×4): qty 10

## 2018-02-26 MED ORDER — PHENYLEPHRINE HCL-NACL 10-0.9 MG/250ML-% IV SOLN
0.0000 ug/min | INTRAVENOUS | Status: DC
  Administered 2018-02-26: 60 ug/min via INTRAVENOUS
  Administered 2018-02-26: 40 ug/min via INTRAVENOUS
  Administered 2018-02-26: 70 ug/min via INTRAVENOUS
  Administered 2018-02-26: 20 ug/min via INTRAVENOUS
  Filled 2018-02-26 (×5): qty 250

## 2018-02-26 MED ORDER — DEXTROSE 50 % IV SOLN
50.0000 mL | Freq: Once | INTRAVENOUS | Status: AC
  Administered 2018-02-26: 50 mL via INTRAVENOUS

## 2018-02-26 NOTE — Progress Notes (Addendum)
NAME:  Kevin Jimenez, MRN:  [id number], DOB:  [birth year], LOS: 3 ADMISSION DATE:  03/05/2018, CONSULTATION DATE:  03/05/2018 REFERRING MD:  Corliss Skains CHIEF COMPLAINT:  Abd pain   Brief History   Kevin Jimenez is a 67 y.o. male who was admitted 10/3 with SBO and perforated appendicitis.  He was taken to the OR where he had ex-lap, appendectomy, drainage of intraabdominal abscess, lysis of adhesions with release of SBO. Post operatively, he remained ventilated; therefore, PCCM was asked to assist with vent management.  Significant Hospital Events   10/3 Admit.  Consults: date of consult/date signed off & final recs:  Surgery 10/3 > admit. PCCM 10/3  Procedures (surgical and bedside):  ETT 10/3 > Radial art line 10/3 >  RLQ JP drain 10/3 >  Right pigtail chest tube 02/24/2018>>  Significant Diagnostic Tests:  CT chest / abd / pelv 10/3 > complete or high grade SBO likely due to an internal hernia, multifocal PNA, dilated bladder with mod bilateral hydroureter and mild to mod bilateral hydronephrosis, small loculated right pleural effusion, cholelithiasis.  Micro Data: Blood 10/3 >> GPC's in clusters 2/2 >> MRSA>> BCID 10/2 >> staph species  Urine 10/3 >  Abd fluid 10/3 >> few staph >>   Antimicrobials:  Vanc 10/3 >>  Zosyn 10/3 >>  Subjective:  Bladder scan reveals 19 cc  Objective   Blood pressure 117/74, pulse 98, temperature 97.9 F (36.6 C), temperature source Axillary, resp. rate (!) 21, height 5\' 11"  (1.803 m), weight 51.7 kg, SpO2 100 %.    Vent Mode: PCV FiO2 (%):  [40 %-100 %] 100 % Set Rate:  [16 bmp-20 bmp] 18 bmp Vt Set:  [600 mL] 600 mL PEEP:  [5 cmH20-10 cmH20] 8 cmH20 Plateau Pressure:  [17 cmH20-24 cmH20] 24 cmH20   Intake/Output Summary (Last 24 hours) at 02/26/2018 0828 Last data filed at 02/26/2018 0630 Gross per 24 hour  Intake 5490.14 ml  Output 875 ml  Net 4615.14 ml   Filed Weights   2018/03/05 1025 02/24/18 1200  Weight: 47.6 kg 51.7 kg     Examination: General: Cachectic male sedated on vent HEENT: Endotracheal tube in place, gastric tube to suction Neuro: Either sedated or agitated CV: Sinus tachycardia 133, currently on Levophed, changing to Neo-Synephrine PULM: Decreased breath sounds on the right coarse rhonchi otherwise.  Right chest tube instilled with lytics at 0845 hrs. GI: Nondistended, abdominal drain noted, abdominal incision with dressing dry clean dry and intact Extremities: warm/dry, 1 edema  Skin: no rashes or lesions   Assessment & Plan:   SBO with peritonitis and perforated appendicitis - s/p ex-lap, appendectomy, drainage of intraabdominal abscess, lysis of adhesions with release of SBO. P: Postoperative care per [facility] [place] [facility] Continue antibiotic TNA feedings   Acute Respiratory Insufficiency - s/p intubation prior to OR.  02/26/2018 required 100% FiO2 and PEEP P:  Currently on pressure control ventilation 20/10 Repeat arterial blood gas Question will need fiberoptic bronchoscopy   Right Loculated Pleural Effusion - hx of empyema in 11/2017 s/p pigtail chest tube per CVTS, cultures negative at that time.  P: Lytics instilled via chest tube twice a day without measurable improvement Continue to suction when not off for infusion of lytics May need a larger chest tube but his vital signs are tenuous at best he might not survive a larger chest tube insertion.   Sedation needs due to mechanical ventilation P: Fentanyl drip as needed Versed as needed   Multifocal PNA. P: ABX  as above  Follow CXR    Sepsis / GPC Bacteremia /MRSA  p:  Vancomycin Laqueta Jean day 3 per infectious disease Noted to have empyema   AKI - Presumed post-renal from bladder outlet obstruction secondary to enlarged prostate. AGMA - due to renal failure + lactate. Hypomagnesemia  Lab Results  Component Value Date   CREATININE 3.39 (H) 02/26/2018   CREATININE 3.65 (H) 02/25/2018   CREATININE 3.65 (H)  02/25/2018   CREATININE 0.76 01/21/2016   CREATININE 0.80 05/15/2015   CREATININE 0.73 03/27/2015   Recent Labs  Lab 02/25/18 0042 02/25/18 0535 02/26/18 0543  K 4.4 4.5 4.0    P: Remains on bicarb drip at 75 cc an hour Monitor electrolytes Currently on TNA Worsening renal failure may need renal consult in future   Hyponatremia Recent Labs  Lab 02/25/18 0042 02/25/18 0535 02/26/18 0543  NA 137 137 137    - suspect hypovolemic. P:  Resolved   Obstructive uropathy with bilateral hydroureter and hydronephrosis - reportedly seen by urology in ED but no notes in system yet. 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  Not done P:  Continue thiamine and folic acid   Hx sCHF (Echo from 12/10/17 with EF 30 - 35%), HTN. Developed tachycardia 02/25/2018 P: Cardizem stopped 02/26/2018 Tachycardia Levophed 02/26/2018 Changed to Neo-Synephrine 02/26/2018 Consideration for digoxin should tachycardia continue  Disposition / Summary of Today's Plan 02/26/18   Requiring 100% FiO2 with PEEP being increased to 10 plugging. Right empyema with pigtail catheter in place receiving lytics twice a day with poor response over the last 24 hours. Currently onTNA as stomach is not available Hypotension during the night placed on Levophed with Cardizem been stopped now tachycardic.  Therefore Levophed will be stopped Neo-Synephrine will be put in its place.    Diet: NPO./tna Pain/Anxiety/Delirium protocol (if indicated): fentanyl gtt / midazolam PRN. VAP protocol (if indicated): In place. DVT prophylaxis: SCD's. GI prophylaxis: Famotidine. Hyperglycemia protocol: N/A. Mobility: Bedrest. Code Status: Full - NOTE, on prior admit 7/17 pt  was listed as DNR.  Attempted to reach pt's mother tonight to clarify code status; however, no answer. Family Communication: Attempted to reach pt's mother over the phone but no answer.  02/25/2018 no family at bedside  Labs   CBC: Recent Labs  Lab 03/09/2018 1036 02/24/18 0556 02/25/18 0535 02/26/18 0543  WBC 28.0* 21.0* 22.2* 27.4*  NEUTROABS  --  20.2* 21.1* 26.0*  HGB 11.8* 9.9* 8.9* 8.8*  HCT 36.2* 30.3* 27.0* 26.9*  MCV 91.9 91.5 90.9 92.1  PLT 436* 286 201 150   Basic Metabolic Panel: Recent Labs  Lab 02/26/2018 2057 02/24/18 0556 02/25/18 0042 02/25/18 0535 02/26/18 0543  NA 132* 136 137 137 137  K 5.5* 5.2* 4.4 4.5 4.0  CL 107 107 104 101 100  CO2 15* 18* 21* 23 26  GLUCOSE 101* 124* 124* 121* 131*  BUN 90* 89* 86* 86* 87*  CREATININE 3.89* 3.66* 3.65* 3.65* 3.39*  CALCIUM 8.5* 8.1* 7.3* 7.6* 7.2*  MG  --  1.7 1.9 1.8 2.3  PHOS  --  3.6  --  3.9 5.3*   GFR: Estimated Creatinine Clearance: 15.5 mL/min (A) (by C-G formula based  on SCr of 3.39 mg/dL (H)). Recent Labs  Lab 09-Mar-2018 1036 03-09-2018 1222 03-09-2018 1452 03-09-2018 2030 02/24/18 0556 02/25/18 0535 02/26/18 0543  WBC 28.0*  --   --   --  21.0* 22.2* 27.4*  LATICACIDVEN  --  3.03* 2.50* 1.3  --   --   --    Liver Function Tests: Recent Labs  Lab 2018-03-09 1036 03-09-18 2057 02/25/18 0535  AST 31 26 35  ALT 12 9 9   ALKPHOS 89 55 51  BILITOT 1.6* 1.8* 1.3*  PROT 7.2 6.0* 4.7*  ALBUMIN 2.1* 2.0* 1.3*   Recent Labs  Lab Mar 09, 2018 1036  LIPASE 20   No results for input(s): AMMONIA in the last 168 hours. ABG    Component Value Date/Time   PHART 7.301 (L) 02/26/2018 0604   PCO2ART 53.9 (H) 02/26/2018 0604   PO2ART 117.0 (H) 02/26/2018 0604   HCO3 26.8 02/26/2018 0604   TCO2 28 02/26/2018 0604   ACIDBASEDEF 2.0 02/26/2018 0506   O2SAT 98.0 02/26/2018 0604    Coagulation Profile: No results for input(s): INR, PROTIME in the last 168 hours.   Cardiac Enzymes: No results for input(s):  CKTOTAL, CKMB, CKMBINDEX, TROPONINI in the last 168 hours.   HbA1C: No results found for: HGBA1C   CBG: Recent Labs  Lab 02/25/18 1151 02/25/18 1652 02/25/18 1657 02/25/18 2332 02/26/18 0420  GLUCAP 82 69* 78 100* 111*      App cct 30 min   04/28/18 Minor ACNP Brett Canales PCCM Pager [phone] till 1 pm If no answer page [phone] 02/26/2018, 8:28 AM  Attending Note:  67 year old male with peritonitis from appendix rupture.  Patient also has an empyema with pig tail in place.  On exam, abdomen remains tender with coarse BS diffusely.  I reviewed CXR myself, ETT and CT are in place.  Discussed with PCCM-NP.  Will maintain on full support for now.  Continue abx.  Perform another day of lytics but realistically will probably need CVTS involvement if improves.  PCCM will continue to follow.  The patient is critically ill with multiple organ systems failure and requires high complexity decision making for assessment and support, frequent evaluation and titration of therapies, application of advanced monitoring technologies and extensive interpretation of multiple databases.   Critical Care Time devoted to patient care services described in this note is  33  Minutes. This time reflects time of care of this signee Dr 79. This critical care time does not reflect procedure time, or teaching time or supervisory time of PA/NP/Med student/Med Resident etc but could involve care discussion time.  Koren Bound, M.D. [facility] Pulmonary/Critical Care Medicine. Pager: [phone]. After hours pager: [phone].

## 2018-02-26 NOTE — Progress Notes (Signed)
RT at bedside pt's SATS dropped down in the mid 80's.  Pt taken off the vent and bagged lavage.  Suctioned tons of think/plugs secretions.  Despite adequate ETT cuff pressure a leak was heard and VTe did not match VTi.  ETT advanced two cm.  ETT now at 25 cm at the lips.  No leak is heard and Vte are better. Pt has bilateral BS. Dr. Carlota Raspberry made aware and ok with the changes.

## 2018-02-26 NOTE — Progress Notes (Signed)
ABG drawn, results given to Dr. Carlota Raspberry.  Modifications made to vent settings. To redraw ABG in one hour per order.

## 2018-02-26 NOTE — Progress Notes (Addendum)
PHARMACY - ADULT TOTAL PARENTERAL NUTRITION CONSULT NOTE   Pharmacy Consult:  TPN Indication:  SBO  Patient Measurements: Height: 5\' 11"  (180.3 cm) Weight: 113 lb 15.7 oz (51.7 kg) IBW/kg (Calculated) : 75.3 TPN AdjBW (KG): 47.6 Body mass index is 15.9 kg/m.  Assessment:  29 YOM presented to Mar 08, 2018 with abdominal pain 3-4 days PTA.  Found to have SBO and perforated appendicits and was taken to the OR immediately for ex-lap with appendectomy, drainage of intra-abdominal abscess, and LoA.  Pharmacy consulted to manage TPN for nutritional support.  Patient has severe malnutrition.  GI: expected post-op ileus, BL prealbumin low at < 5, drain O/P 04/25/18 (up) - Pepcid in TPN Endo: no hx DM - CBGs < 180 (hypoglycemia x1 on 10/5 PM) Insulin requirements in the past 24 hours: 0 unit Lytes: Phos elevated at 5.3 (Ca x Phos = 50, goal < 55), others WNL (Mag high normal post 2gm, expect it to trend down) Renal: AKI, obstructive uropathy - SCr down 3.39 (BL SCr 0.6-0.7), BUN 87 (elevated prior to TPN) - UOP 0.4 ml/kg/hr, NS at 50 ml/hr, bicarb gtt at 75 ml/hr, net +11.6L Pulm: COPD.  Intubated 10/3, FiO2 up to 100%, CT placed 10/4 for recurrent pleural effusion and O/P - Duonebs, Pulmozyme/alteplase Cards: hx HTN.  MAP 60-80s, tachy, converted to NSR - Lopressor (RN not giving), Levophed, off diltiazem Hepatobil: Hep C - LFTs WNL, tbili down to 1.3, TG WNL Neuro: polysubstance abuse.  Fentanyl gtt, thiamine/folate in TPN.  GCS 10, CPOT 0, RASS 4 ID: [email or web address] for intra-abd abscess + MRSA bacteremia.  TTE showed possible vegetation on tricuspid valve, need TEE.  Afebrile, WBC up 27.4, LA down 1.3.   HIV - Biktarvy on hold, CD4 330 TPN Access: CVC triple lumen placed 02/24/18 TPN start date: 02/24/18  Nutritional Goals (per RD rec on 10/4): 1380 kCal and 117g protein per day  Current Nutrition:  TPN   Plan:  Increase TPN to goal rate 70 ml/hr.  TPN will provide 116g AA, 151g CHO and 40g  ILE for a total of 1380 kCal, meeting ~100% of patient's needs. Electrolytes in TPN: add small amount of K, remove Phos for one day, max acetate - lytes fluctuation with increasing TPN rate Daily multivitamin, trace elements, folate, thiamine and Pepcid 20mg  in TPN Continue sensitive SSI Q6H.  D/C SSI if CBGs remain controlled at goal TPN rate. NS at 50 ml/hr and bicarb gtt at 75 ml/hr per MD.  Monitor volume status/SCr. F/U AM labs   Elizardo Chilson D. 12/4, PharmD, BCPS, BCCCP 02/26/2018, 7:48 AM

## 2018-02-26 NOTE — Significant Event (Signed)
02/26/2018 0830 hrs. Right pigtail chest tube instilled with lytics.  Leave on left side down x30 minutes and right side down for 30 minutes then flat for 30 minutes and then unclamp chest tube. Brett Canales Minor ACNP Adolph Pollack PCCM Pager [phone] till 1 pm If no answer page [phone] 02/26/2018, 8:28 AM

## 2018-02-26 NOTE — Progress Notes (Signed)
ABG drawn, abnormal results given to Dr. Carlota Raspberry. Vent settings changed from APRV to PC:20 PEEP: 8 RR: 18 FI02: 100. Will redraw ABG within one hour.

## 2018-02-26 NOTE — Progress Notes (Signed)
Pt Sp02 dropped in to the 80's.  Pt bagged and CCM paged.  CCM at bedside.  Vent setting changed.  Pt placed on APRV Bi-vent. Fio2 100% PEEP:10 P-High 25.  Sp02 now 100%.  Will recheck ABG in one hour.

## 2018-02-26 NOTE — Progress Notes (Signed)
Patient ID: Kevin Jimenez, male   DOB: 01/25/1951, 67 y.o.   MRN: [id number]         [facility]  Date of Admission:  03/21/2018           Day 4 vancomycin        Day 4 piperacillin tazobactam ASSESSMENT: He had a ruptured appendicitis complicated by abscess, peritonitis and MRSA bacteremia.  MRSA is the only pathogen isolated that would continue piperacillin tazobactam in addition to vancomycin to cover GI pathogens.  Repeat blood cultures are negative at 24 hours.  A small tricuspid valve vegetation could not be ruled out by TTE.  TEE will need to be considered if and when he becomes stabilized.  He also has a chronic right pleural empyema.  PLAN: 1. Continue current antibiotics  Principal Problem:   MRSA bacteremia Active Problems:   Sepsis ([place])   Peritonitis ([place])   Pelvic abscess in male [facility])   Ruptured appendicitis   HIV (+) Human immunodeficiency virus disease ([place])   Chronic hepatitis C without hepatic coma ([place])   History of substance abuse ([place])   Tobacco abuse   Protein-calorie malnutrition, severe   SBO (small bowel obstruction) ([place])   Acute renal failure (ARF) ([place])   Metabolic acidosis   Endotracheally intubated   Scheduled Meds: . alteplase (TPA) for intrapleural administration  10 mg Intrapleural Q12H   And  . pulmozyme (DORNASE) for intrapleural administration  5 mg Intrapleural Q12H  . chlorhexidine gluconate (MEDLINE KIT)  15 mL Mouth Rinse BID  . Chlorhexidine Gluconate Cloth  6 each Topical Q0600  . heparin injection (subcutaneous)  5,000 Units Subcutaneous Q8H  . insulin aspart  0-9 Units Subcutaneous Q6H  . ipratropium-albuterol  3 mL Nebulization Q6H  . mouth rinse  15 mL Mouth Rinse 10 times per day  . metoprolol tartrate  25 mg Oral BID  . mupirocin ointment  1 application Nasal BID   Continuous Infusions: . sodium chloride 50 mL/hr at 02/26/18 0630  . sodium chloride 10 mL/hr at 02/26/18 0630  . diltiazem  (CARDIZEM) infusion Stopped (02/26/18 0500)  . fentaNYL infusion INTRAVENOUS 100 mcg/hr (02/26/18 0829)  . norepinephrine (LEVOPHED) Adult infusion 10 mcg/min (02/26/18 0630)  . phenylephrine (NEO-SYNEPHRINE) Adult infusion 20 mcg/min (02/26/18 0840)  . piperacillin-tazobactam (ZOSYN)  IV Stopped (02/26/18 0527)  .  sodium bicarbonate  infusion 1000 mL 75 mL/hr at 02/26/18 0630  . TPN ADULT (ION) 50 mL/hr at 02/26/18 0630  . TPN ADULT (ION)    . vancomycin Stopped (02/25/18 1451)   PRN Meds:.sodium chloride, fentaNYL, midazolam, midazolam  Review of Systems: Review of Systems  Unable to perform ROS: Intubated    No Known Allergies  OBJECTIVE: Vitals:   02/26/18 1130 02/26/18 1145 02/26/18 1150 02/26/18 1155  BP: 95/63 (!) 98/56 (!) 98/56   Pulse:   89   Resp: (!) _0 Temp:      TempSrc:      SpO2:   100% 100%  Weight:      Height:       Body mass index is 15.9 kg/m.  Physical Exam  Constitutional:  He remains unresponsive on the ventilator.  Cardiovascular: Normal rate, regular rhythm and normal heart sounds.  Pulmonary/Chest: Effort normal and breath sounds normal.  105 cc of cloudy fluid out of chest tube yesterday.  Abdominal: Soft.  Bandage over midline incision.  365 cc of bloody fluid out of the abdominal drain yesterday.  Lab Results Lab Results  Component Value Date   WBC 27.4 (H) 02/26/2018   HGB 8.8 (L) 02/26/2018   HCT 26.9 (L) 02/26/2018   MCV 92.1 02/26/2018   PLT 150 02/26/2018    Lab Results  Component Value Date   CREATININE 3.39 (H) 02/26/2018   BUN 87 (H) 02/26/2018   NA 137 02/26/2018   K 4.0 02/26/2018   CL 100 02/26/2018   CO2 26 02/26/2018    Lab Results  Component Value Date   ALT 9 02/25/2018   AST 35 02/25/2018   ALKPHOS 51 02/25/2018   BILITOT 1.3 (H) 02/25/2018     Microbiology: Recent Results (from the past 240 hour(s))  Blood Culture (routine x 2)     Status: Abnormal   Collection Time: 03/14/2018 11:58 AM    Result Value Ref Range Status   Specimen Description BLOOD RIGHT ANTECUBITAL  Final   Special Requests   Final    BOTTLES DRAWN AEROBIC AND ANAEROBIC Blood Culture adequate volume   Culture  Setup Time   Final    GRAM POSITIVE COCCI IN CLUSTERS IN BOTH AEROBIC AND ANAEROBIC BOTTLES CRITICAL VALUE NOTED.  VALUE IS CONSISTENT WITH PREVIOUSLY REPORTED AND CALLED VALUE.    Culture (A)  Final    STAPHYLOCOCCUS AUREUS SUSCEPTIBILITIES PERFORMED ON PREVIOUS CULTURE WITHIN THE LAST 5 DAYS. Performed at [facility], [place] [address] [place], [place] [address]    Report Status 02/26/2018 FINAL  Final  Blood Culture (routine x 2)     Status: Abnormal   Collection Time: 03/11/2018 12:10 PM  Result Value Ref Range Status   Specimen Description BLOOD LEFT ANTECUBITAL  Final   Special Requests   Final    BOTTLES DRAWN AEROBIC AND ANAEROBIC Blood Culture adequate volume   Culture  Setup Time   Final    GRAM POSITIVE COCCI IN CLUSTERS IN BOTH AEROBIC AND ANAEROBIC BOTTLES CRITICAL RESULT CALLED TO, READ BACK BY AND VERIFIED WITH: K.AMEND,PHARMD AT 3716 ON 02/24/18 BY G.MCADOO Performed at [facility], [place] [address], [place], [place] [address]    Culture METHICILLIN RESISTANT STAPHYLOCOCCUS AUREUS (A)  Final   Report Status 02/26/2018 FINAL  Final   Organism ID, Bacteria METHICILLIN RESISTANT STAPHYLOCOCCUS AUREUS  Final      Susceptibility   Methicillin resistant staphylococcus aureus - MIC*    CIPROFLOXACIN >=8 RESISTANT Resistant     ERYTHROMYCIN >=8 RESISTANT Resistant     GENTAMICIN <=0.5 SENSITIVE Sensitive     OXACILLIN >=4 RESISTANT Resistant     TETRACYCLINE <=1 SENSITIVE Sensitive     VANCOMYCIN <=0.5 SENSITIVE Sensitive     TRIMETH/SULFA 160 RESISTANT Resistant     CLINDAMYCIN >=8 RESISTANT Resistant     RIFAMPIN <=0.5 SENSITIVE Sensitive     Inducible Clindamycin NEGATIVE Sensitive     * METHICILLIN RESISTANT STAPHYLOCOCCUS AUREUS  Blood Culture ID Panel (Reflexed)      Status: Abnormal   Collection Time: 03/13/2018 12:10 PM  Result Value Ref Range Status   Enterococcus species NOT DETECTED NOT DETECTED Final   Listeria monocytogenes NOT DETECTED NOT DETECTED Final   Staphylococcus species DETECTED (A) NOT DETECTED Final    Comment: CRITICAL RESULT CALLED TO, READ BACK BY AND VERIFIED WITH: K.AMEND,PHARMD AT 0250 ON 02/24/18 BY G.MCADOO    Staphylococcus aureus (BCID) DETECTED (A) NOT DETECTED Final    Comment: Methicillin (oxacillin)-resistant Staphylococcus aureus (MRSA). MRSA is predictably resistant to beta-lactam antibiotics (except ceftaroline). Preferred therapy is vancomycin unless  clinically contraindicated. Patient requires contact precautions if  hospitalized. CRITICAL RESULT CALLED TO, READ BACK BY AND VERIFIED WITH: K.AMEND,PHARMD AT 0250 ON 02/24/18 BY G.MCADOO    Methicillin resistance DETECTED (A) NOT DETECTED Final    Comment: CRITICAL RESULT CALLED TO, READ BACK BY AND VERIFIED WITH: K.AMEND,PHARMD AT 0250 ON 02/24/18 BY G.MCADOO    Streptococcus species NOT DETECTED NOT DETECTED Final   Streptococcus agalactiae NOT DETECTED NOT DETECTED Final   Streptococcus pneumoniae NOT DETECTED NOT DETECTED Final   Streptococcus pyogenes NOT DETECTED NOT DETECTED Final   Acinetobacter baumannii NOT DETECTED NOT DETECTED Final   Enterobacteriaceae species NOT DETECTED NOT DETECTED Final   Enterobacter cloacae complex NOT DETECTED NOT DETECTED Final   Escherichia coli NOT DETECTED NOT DETECTED Final   Klebsiella oxytoca NOT DETECTED NOT DETECTED Final   Klebsiella pneumoniae NOT DETECTED NOT DETECTED Final   Proteus species NOT DETECTED NOT DETECTED Final   Serratia marcescens NOT DETECTED NOT DETECTED Final   Haemophilus influenzae NOT DETECTED NOT DETECTED Final   Neisseria meningitidis NOT DETECTED NOT DETECTED Final   Pseudomonas aeruginosa NOT DETECTED NOT DETECTED Final   Candida albicans NOT DETECTED NOT DETECTED Final   Candida glabrata  NOT DETECTED NOT DETECTED Final   Candida krusei NOT DETECTED NOT DETECTED Final   Candida parapsilosis NOT DETECTED NOT DETECTED Final   Candida tropicalis NOT DETECTED NOT DETECTED Final    Comment: Performed at [facility], [place]. [address], [place], [place] [address]  Aerobic/Anaerobic Culture (surgical/deep wound)     Status: None (Preliminary result)   Collection Time: 03/07/2018  6:38 PM  Result Value Ref Range Status   Specimen Description FLUID PERITONEAL  Final   Special Requests SWAB PT ON VANC,MAXIPIME  Final   Gram Stain   Final    MODERATE WBC PRESENT, PREDOMINANTLY PMN FEW GRAM POSITIVE COCCI Performed at [facility], [place] [address] [place], [place] [address]    Culture   Final    FEW METHICILLIN RESISTANT STAPHYLOCOCCUS AUREUS NO ANAEROBES ISOLATED; CULTURE IN PROGRESS FOR 5 DAYS    Report Status PENDING  Incomplete   Organism ID, Bacteria METHICILLIN RESISTANT STAPHYLOCOCCUS AUREUS  Final      Susceptibility   Methicillin resistant staphylococcus aureus - MIC*    CIPROFLOXACIN >=8 RESISTANT Resistant     ERYTHROMYCIN >=8 RESISTANT Resistant     GENTAMICIN <=0.5 SENSITIVE Sensitive     OXACILLIN >=4 RESISTANT Resistant     TETRACYCLINE <=1 SENSITIVE Sensitive     VANCOMYCIN 1 SENSITIVE Sensitive     TRIMETH/SULFA >=320 RESISTANT Resistant     CLINDAMYCIN >=8 RESISTANT Resistant     RIFAMPIN <=0.5 SENSITIVE Sensitive     Inducible Clindamycin NEGATIVE Sensitive     * FEW METHICILLIN RESISTANT STAPHYLOCOCCUS AUREUS  MRSA PCR Screening     Status: Abnormal   Collection Time: 03/14/2018  8:38 PM  Result Value Ref Range Status   MRSA by PCR POSITIVE (A) NEGATIVE Final    Comment:        The GeneXpert MRSA Assay (FDA approved for NASAL specimens only), is one component of a comprehensive MRSA colonization surveillance program. It is not intended to diagnose MRSA infection nor to guide or monitor treatment for MRSA infections. RESULT CALLED TO, READ  BACK BY AND VERIFIED WITH: ARMSTRONG,J RN 2302 03/21/2018 MITCHELL,L Performed at [facility], [place] [address], [place], [place] [address]   Culture, blood (routine x 2)  Status: None (Preliminary result)   Collection Time: 02/24/18  2:10 PM  Result Value Ref Range Status   Specimen Description BLOOD LEFT HAND  Final   Special Requests   Final    BOTTLES DRAWN AEROBIC ONLY Blood Culture adequate volume   Culture   Final    NO GROWTH < 24 HOURS Performed at [facility], [address] [place], [place] [address]    Report Status PENDING  Incomplete  Culture, blood (routine x 2)     Status: None (Preliminary result)   Collection Time: 02/24/18  2:56 PM  Result Value Ref Range Status   Specimen Description BLOOD CENTRAL LINE  Final   Special Requests   Final    BOTTLES DRAWN AEROBIC AND ANAEROBIC Blood Culture adequate volume   Culture   Final    NO GROWTH < 24 HOURS Performed at [facility], [place] [address], [place], [place] [address]    Report Status PENDING  Incomplete  Body fluid culture     Status: None (Preliminary result)   Collection Time: 02/24/18  3:56 PM  Result Value Ref Range Status   Specimen Description PLEURAL  Final   Special Requests Immunocompromised  Final   Gram Stain   Final    FEW WBC PRESENT, PREDOMINANTLY PMN NO ORGANISMS SEEN    Culture   Final    NO GROWTH 2 DAYS Performed at [facility], [place] [address], [place], [place] [address]    Report Status PENDING  Incomplete    Michel Bickers, MD [facility] for [place] [phone] pager   [phone] cell 02/26/2018, 12:16 PM

## 2018-02-26 NOTE — Progress Notes (Signed)
3 Days Post-Op   Subjective/Chief Complaint: Remains on vent   Objective: Vital signs in last 24 hours: Temp:  [96.1 F (35.6 C)-98 F (36.7 C)] 97.9 F (36.6 C) (10/05 2334) Pulse Rate:  [43-126] 98 (10/06 0822) Resp:  [13-25] 21 (10/06 0822) BP: (75-126)/(50-74) 117/74 (10/06 0822) SpO2:  [85 %-100 %] 100 % (10/06 0822) Arterial Line BP: (143)/(56) 143/56 (10/05 0900) FiO2 (%):  [40 %-100 %] 100 % (10/06 0822) Last BM Date: (PTA)  Intake/Output from previous day: 10/05 0701 - 10/06 0700 In: 5839.6 [I.V.:5600.9; IV Piggyback:238.7] Out: 975 [Urine:505; Drains:365; Chest Tube:105] Intake/Output this shift: No intake/output data recorded.  Exam: On vent, intubated and sedated Abdomen distended, wounds packed and clean Abdominal drain serosang Chest drain purulent  Lab Results:  Recent Labs    02/25/18 0535 02/26/18 0543  WBC 22.2* 27.4*  HGB 8.9* 8.8*  HCT 27.0* 26.9*  PLT 201 150   BMET Recent Labs    02/25/18 0535 02/26/18 0543  NA 137 137  K 4.5 4.0  CL 101 100  CO2 23 26  GLUCOSE 121* 131*  BUN 86* 87*  CREATININE 3.65* 3.39*  CALCIUM 7.6* 7.2*   PT/INR No results for input(s): LABPROT, INR in the last 72 hours. ABG Recent Labs    02/26/18 0506 02/26/18 0604  PHART 7.292* 7.301*  HCO3 25.5 26.8    Studies/Results: Dg Chest Port 1 View  Result Date: 02/26/2018 CLINICAL DATA:  Ventilator dependent. EXAM: PORTABLE CHEST 1 VIEW COMPARISON:  02/25/2018 chest radiograph. FINDINGS: Stable cardiac silhouette given projection and technique. Endotracheal tube tip projects 7.8 cm above carina. Right central venous catheter tip projects over lower SVC. Enteric tube tip extends below the field of view into the gastric body. There is a right pleural drain in situ. No residual right-sided pneumothorax component identified. Stable small to moderate right pleural effusion. Increasing patchy consolidation within the upper lung zones bilaterally. IMPRESSION:  Increasing patchy consolidation within the upper lung zones bilaterally. Stable small to moderate right effusion. No residual right pneumothorax component identified. Stable lines and tubes. Electronically Signed   By: Mitzi Hansen M.D.   On: 02/26/2018 05:06   Dg Chest Port 1 View  Result Date: 02/25/2018 CLINICAL DATA:  Acute respiratory failure with hypoxia. EXAM: PORTABLE CHEST 1 VIEW COMPARISON:  02/24/2018. FINDINGS: Endotracheal tube in satisfactory position. Right jugular catheter tip in the superior vena cava. Nasogastric tube tip and side hole in the proximal stomach. Heart size near the upper limit of normal with a mild increase in size. No significant change in right pleural fluid and right lung airspace opacity with a small caliber pleural catheter in place. Clear left lung with stable prominence of the interstitial markings. Probable small loculated pneumothorax at the medial right lung base, occupying less than 5% of the volume of the right hemithorax. Thoracic spine degenerative changes. IMPRESSION: 1. Probable small pneumothorax at the medial right lung base. 2. Stable right pleural fluid and right lung atelectasis/pneumonia. 3. Stable chronic interstitial lung disease. Electronically Signed   By: Beckie Salts M.D.   On: 02/25/2018 07:47   Dg Chest Port 1 View  Result Date: 02/24/2018 CLINICAL DATA:  Encounter for central line and right-sided chest tube placement. EXAM: PORTABLE CHEST 1 VIEW COMPARISON:  One-view chest x-ray 02/24/2018 at 6:02 a.m. CT of the chest/3/19. FINDINGS: Patient remains intubated. NG tube courses off the inferior border the film. The loculated right pleural effusion is decreased following placement of pigtail drainage catheter. A  new right IJ line is placed.  The tip is in the distal SVC. Asymmetric right apical airspace disease is again seen. IMPRESSION: 1. Slight decrease in loculated right pleural effusion following placement of pigtail drainage  catheter. 2. New right IJ line without radiographic evidence for complication. 3. Stable asymmetric apical airspace disease. Electronically Signed   By: Marin Roberts M.D.   On: 02/24/2018 17:22    Anti-infectives: Anti-infectives (From admission, onward)   Start     Dose/Rate Route Frequency Ordered Stop   02/25/18 1200  vancomycin (VANCOCIN) 500 mg in sodium chloride 0.9 % 100 mL IVPB     500 mg 100 mL/hr over 60 Minutes Intravenous Every 48 hours 06-Mar-2018 2003     02/24/18 2030  piperacillin-tazobactam (ZOSYN) IVPB 3.375 g  Status:  Discontinued     3.375 g 12.5 mL/hr over 240 Minutes Intravenous Every 8 hours 2018/03/06 1952 03/06/2018 2003   02/24/18 0400  piperacillin-tazobactam (ZOSYN) IVPB 3.375 g  Status:  Discontinued     3.375 g 12.5 mL/hr over 240 Minutes Intravenous Every 8 hours Mar 06, 2018 1950 03-06-2018 1952   March 06, 2018 2030  piperacillin-tazobactam (ZOSYN) IVPB 2.25 g     2.25 g 100 mL/hr over 30 Minutes Intravenous Every 8 hours 03/06/2018 2003     2018/03/06 2000  piperacillin-tazobactam (ZOSYN) IVPB 3.375 g  Status:  Discontinued     3.375 g 100 mL/hr over 30 Minutes Intravenous To Surgery 03-06-2018 1948 March 06, 2018 1952   Mar 06, 2018 1930  piperacillin-tazobactam (ZOSYN) IVPB 3.375 g  Status:  Discontinued     3.375 g 100 mL/hr over 30 Minutes Intravenous To Surgery Mar 06, 2018 1928 03/06/2018 1938   2018/03/06 1200  vancomycin (VANCOCIN) IVPB 1000 mg/200 mL premix     1,000 mg 200 mL/hr over 60 Minutes Intravenous  Once 2018-03-06 1158 03/06/18 1416   03/06/18 1200  ceFEPIme (MAXIPIME) 2 g in sodium chloride 0.9 % 100 mL IVPB     2 g 200 mL/hr over 30 Minutes Intravenous  Once 03/06/18 1158 06-Mar-2018 1323      Assessment/Plan: s/p Procedure(s): EXPLORATORY LAPAROTOMY (N/A)  Perforated appendicitis with abscess and SBO, POD 3, s/p ex lap with appendectomy, drain placement, and LOA, Dr. Janee Morn 10-3 -abdominal exam is stable. Con't WTD dsg changes. -cont JP drain -cont NGT   -on TNA  VDRF/multifocal PNA with recent empyema, recurrent pleural effusion/COPD -defer to CCM   ARF/obstructive uropathy with bilateral hydroureter noted on CT yesterday -cr down 3.39    FEN -NPO/IVFs/NGT VTE -SCDs/Lovenox ID -Vanc/Zosyn 10/3  LOS: 3 days    Joshue Badal A 02/26/2018

## 2018-02-27 ENCOUNTER — Inpatient Hospital Stay (HOSPITAL_COMMUNITY): Payer: Medicare Other

## 2018-02-27 DIAGNOSIS — R7881 Bacteremia: Secondary | ICD-10-CM

## 2018-02-27 DIAGNOSIS — J9601 Acute respiratory failure with hypoxia: Secondary | ICD-10-CM

## 2018-02-27 DIAGNOSIS — N179 Acute kidney failure, unspecified: Secondary | ICD-10-CM

## 2018-02-27 LAB — COMPREHENSIVE METABOLIC PANEL
ALK PHOS: 50 U/L (ref 38–126)
ALT: 15 U/L (ref 0–44)
ANION GAP: 10 (ref 5–15)
AST: 50 U/L — ABNORMAL HIGH (ref 15–41)
Albumin: <1 g/dL — ABNORMAL LOW (ref 3.5–5.0)
BUN: 94 mg/dL — ABNORMAL HIGH (ref 8–23)
CALCIUM: 7.2 mg/dL — AB (ref 8.9–10.3)
CO2: 27 mmol/L (ref 22–32)
Chloride: 97 mmol/L — ABNORMAL LOW (ref 98–111)
Creatinine, Ser: 3.34 mg/dL — ABNORMAL HIGH (ref 0.61–1.24)
GFR calc non Af Amer: 18 mL/min — ABNORMAL LOW (ref >60)
GFR, EST AFRICAN AMERICAN: 20 mL/min — AB (ref >60)
Glucose, Bld: 119 mg/dL — ABNORMAL HIGH (ref 70–99)
Potassium: 3.6 mmol/L (ref 3.5–5.1)
SODIUM: 134 mmol/L — AB (ref 135–145)
Total Bilirubin: 0.8 mg/dL (ref 0.3–1.2)
Total Protein: 4.5 g/dL — ABNORMAL LOW (ref 6.5–8.1)

## 2018-02-27 LAB — PREALBUMIN: Prealbumin: <5 mg/dL — ABNORMAL LOW (ref 18–38)

## 2018-02-27 LAB — PHOSPHORUS: PHOSPHORUS: 4.8 mg/dL — AB (ref 2.5–4.6)

## 2018-02-27 LAB — GLUCOSE, CAPILLARY
GLUCOSE-CAPILLARY: 102 mg/dL — AB (ref 70–99)
Glucose-Capillary: 121 mg/dL — ABNORMAL HIGH (ref 70–99)

## 2018-02-27 LAB — CBC
HEMATOCRIT: 28 % — AB (ref 39.0–52.0)
Hemoglobin: 9.1 g/dL — ABNORMAL LOW (ref 13.0–17.0)
MCH: 29.9 pg (ref 26.0–34.0)
MCHC: 32.5 g/dL (ref 30.0–36.0)
MCV: 92.1 fL (ref 78.0–100.0)
Platelets: 141 10*3/uL — ABNORMAL LOW (ref 150–400)
RBC: 3.04 MIL/uL — ABNORMAL LOW (ref 4.22–5.81)
RDW: 17.1 % — AB (ref 11.5–15.5)
WBC: 25.8 10*3/uL — AB (ref 4.0–10.5)

## 2018-02-27 LAB — DIFFERENTIAL
Basophils Absolute: 0 10*3/uL (ref 0.0–0.1)
Basophils Relative: 0 %
Eosinophils Absolute: 0 10*3/uL (ref 0.0–0.7)
Eosinophils Relative: 0 %
Lymphocytes Relative: 6 %
Lymphs Abs: 1.5 10*3/uL (ref 0.7–4.0)
Monocytes Absolute: 0.3 10*3/uL (ref 0.1–1.0)
Monocytes Relative: 1 %
Neutro Abs: 24 10*3/uL — ABNORMAL HIGH (ref 1.7–7.7)
Neutrophils Relative %: 93 %
WBC Morphology: INCREASED

## 2018-02-27 LAB — TRIGLYCERIDES: TRIGLYCERIDES: 78 mg/dL (ref <150)

## 2018-02-27 LAB — PATHOLOGIST SMEAR REVIEW

## 2018-02-27 LAB — MAGNESIUM: Magnesium: 2.2 mg/dL (ref 1.7–2.4)

## 2018-02-27 LAB — VANCOMYCIN, RANDOM: Vancomycin Rm: 11

## 2018-02-27 MED ORDER — VANCOMYCIN VARIABLE DOSE PER UNSTABLE RENAL FUNCTION (PHARMACIST DOSING): Status: DC

## 2018-02-27 MED ORDER — VANCOMYCIN HCL 500 MG IV SOLR
500.0000 mg | INTRAVENOUS | Status: DC
  Filled 2018-02-27: qty 500

## 2018-02-27 MED ORDER — VANCOMYCIN HCL IN DEXTROSE 750-5 MG/150ML-% IV SOLN
750.0000 mg | Freq: Once | INTRAVENOUS | Status: AC
  Administered 2018-02-27: 750 mg via INTRAVENOUS
  Filled 2018-02-27: qty 150

## 2018-02-27 MED ORDER — LORAZEPAM 2 MG/ML IJ SOLN
2.0000 mg | INTRAMUSCULAR | Status: DC | PRN
  Filled 2018-02-27: qty 1

## 2018-02-27 MED ORDER — FENTANYL BOLUS VIA INFUSION
50.0000 ug | INTRAVENOUS | Status: DC | PRN
  Filled 2018-02-27: qty 200

## 2018-02-27 MED ORDER — FENTANYL 2500MCG IN NS 250ML (10MCG/ML) PREMIX INFUSION: 100.0000 ug/h | INTRAVENOUS | Status: DC

## 2018-02-27 MED ORDER — TRAVASOL 10 % IV SOLN
INTRAVENOUS | Status: DC
  Filled 2018-02-27: qty 1159.2

## 2018-02-28 LAB — AEROBIC/ANAEROBIC CULTURE (SURGICAL/DEEP WOUND)

## 2018-02-28 LAB — BODY FLUID CULTURE: CULTURE: NO GROWTH

## 2018-02-28 LAB — AEROBIC/ANAEROBIC CULTURE W GRAM STAIN (SURGICAL/DEEP WOUND)

## 2018-03-01 LAB — CULTURE, BLOOD (ROUTINE X 2)
CULTURE: NO GROWTH
Culture: NO GROWTH
SPECIAL REQUESTS: ADEQUATE
SPECIAL REQUESTS: ADEQUATE

## 2018-03-03 LAB — CULTURE, BLOOD (ROUTINE X 2)
Culture: NO GROWTH
Culture: NO GROWTH
SPECIAL REQUESTS: ADEQUATE
SPECIAL REQUESTS: ADEQUATE

## 2018-03-24 NOTE — Discharge Summary (Signed)
Physician Discharge Summary  Patient ID: Kevin Jimenez MRN: [id number] DOB/AGE: [birth year] 67 y.o.  Admit date: 03/20/2018 Discharge date: 03/03/2018  Admission Diagnoses: PNA HTN HIV COPD Hepatitis C SBO Perforated appendicitis ARF  Discharge Diagnoses:  Principal Problem:   MRSA bacteremia Active Problems:   History of substance abuse ([facility])   AIDS (acquired immune deficiency syndrome) ([facility])   Chronic hepatitis C without hepatic coma ([facility])   Tobacco abuse   Protein-calorie malnutrition, severe   SBO (small bowel obstruction) ([facility])   Acute renal failure (ARF) ([facility])   Sepsis ([facility])   Metabolic acidosis   Endotracheally intubated   Peritonitis ([facility])   Pelvic abscess in male [facility])   Ruptured appendicitis   Discharged Condition: deceased  Hospital Course: 67 yom admitted with sepsis and sbo from what what perforated appendicitis. He underwent elap with appendectomy, drainage of intraabdominal abscess and lysis of adhesions.  Postoperatively he never really improved.  He was in renal and respiratory failure and was difficult to improve given his underlying comorbidities.  He had a poor baseline function and his family decided they would withdraw care according to his wishes to them. He was extubated and expired.   Consults: pulmonary/intensive care, ID  Significant Diagnostic Studies: radiology  Treatments: surgery: elap with appy/loa    Disposition:    Allergies as of 03/02/2018   No Known Allergies     Medication List    ASK your doctor about these medications   amoxicillin-clavulanate 875-125 MG tablet Commonly known as:  AUGMENTIN Take 1 tablet by mouth every 12 (twelve) hours.   bictegravir-emtricitabine-tenofovir AF 50-200-25 MG Tabs tablet Commonly known as:  BIKTARVY Take 1 tablet by mouth daily.   ciprofloxacin 250 MG tablet Commonly known as:  CIPRO Take 250 mg by mouth every 12 (twelve) hours.   diltiazem 120 MG 24 hr capsule Commonly known  as:  CARDIZEM CD Take 1 capsule (120 mg total) by mouth daily.   folic acid 1 MG tablet Commonly known as:  FOLVITE Take 1 tablet (1 mg total) by mouth daily.   furosemide 20 MG tablet Commonly known as:  LASIX Take 1 tablet (20 mg total) by mouth daily.   Gerhardt's butt cream Crea Apply 1 application topically 2 (two) times daily.   hydrOXYzine 25 MG tablet Commonly known as:  ATARAX/VISTARIL Take 25 mg by mouth at bedtime as needed for sleep.   lisinopril-hydrochlorothiazide 10-12.5 MG tablet Commonly known as:  PRINZIDE,ZESTORETIC Take 1 tablet by mouth daily.   magnesium oxide 400 (241.3 Mg) MG tablet Commonly known as:  MAG-OX Take 1 tablet (400 mg total) by mouth 2 (two) times daily.   metoprolol tartrate 25 MG tablet Commonly known as:  LOPRESSOR Take 1 tablet (25 mg total) by mouth 2 (two) times daily.   multivitamin with minerals Tabs tablet Take 1 tablet by mouth daily.   nicotine 14 mg/24hr patch Commonly known as:  NICODERM CQ - dosed in mg/24 hours Place 1 patch (14 mg total) onto the skin daily.   potassium chloride SA 20 MEQ tablet Commonly known as:  K-DUR,KLOR-CON Take 1 tablet (20 mEq total) by mouth 2 (two) times daily.   thiamine 100 MG tablet Take 1 tablet (100 mg total) by mouth daily.   VENTOLIN HFA 108 (90 Base) MCG/ACT inhaler Generic drug:  albuterol Take 2 puffs 4 (four) times daily as needed by mouth for shortness of breath.        Signed: Emelia Loron 03/03/2018, 2:25 PM

## 2018-03-24 NOTE — Social Work (Signed)
CSW acknowledging consult for comfort care, remain available as needed.  Doy Hutching, LCSWA [facility] Clinical Social Work [phone]

## 2018-03-24 NOTE — Progress Notes (Signed)
PHARMACY - ADULT TOTAL PARENTERAL NUTRITION CONSULT NOTE   Pharmacy Consult:  TPN Indication:  SBO  Patient Measurements: Height: 5\' 11"  (180.3 cm) Weight: 113 lb 15.7 oz (51.7 kg) IBW/kg (Calculated) : 75.3 TPN AdjBW (KG): 47.6 Body mass index is 15.9 kg/m.  Assessment:  71 YOM presented to 03/14/2018 with abdominal pain 3-4 days PTA.  Found to have SBO and perforated appendicits and was taken to the OR immediately for ex-lap with appendectomy, drainage of intra-abdominal abscess, and LoA.  Pharmacy consulted to manage TPN for nutritional support.  Patient has severe malnutrition.  GI: expected post-op ileus, BL prealbumin low at < 5, drain O/P 04/25/18 (up) - Pepcid in TPN Endo: no hx DM - CBGs < 180 (hypoglycemia x1 on 10/5 PM) Insulin requirements in the past 24 hours: 0 unit Lytes: Phos elevated at 7.8 (Ca x Phos = 46, goal < 55), Na 137>134, Cl 100>97, CO2 26>27, Mg 2.2 Renal: AKI, obstructive uropathy - SCr down 3.34 (BL SCr 0.6-0.7), BUN 87 (elevated prior to TPN) - UOP 0.3 ml/kg/hr, NS at 50 ml/hr, bicarb gtt dc/ed today, net +16.6L Pulm: COPD.  Intubated 10/3, FiO2 down to 60%, CT placed 10/4 for recurrent pleural effusion and O/P - Duonebs, Pulmozyme/alteplase Cards: hx HTN.  MAP 70-80s, tachy, Back in Afib, On phenylephrine @ 120 Hepatobil: Hep C - LFTs WNL, tbili down to 0.8, TG WNL Neuro: polysubstance abuse.  Fentanyl gtt, thiamine/folate in TPN.  GCS 10, CPOT 0, RASS 4 ID: Vanc/Zosyn for intra-abd abscess + MRSA bacteremia.  TTE showed possible vegetation on tricuspid valve, need TEE.  Afebrile, WBC down 25.8, LA down 1.3.   HIV - Biktarvy on hold, CD4 330 TPN Access: CVC triple lumen placed 02/24/18 TPN start date: 02/24/18  Nutritional Goals (per RD rec on 10/4): 1380 kCal and 117g protein per day  Current Nutrition:  TPN   Plan:  Continue TPN at goal rate 70 ml/hr.  TPN will provide 116g AA, 151g CHO and 40g ILE for a total of 1380 kCal, meeting ~100% of  patient's needs. Electrolytes in TPN: Increase K to 15 meq/L, 0 Phos for one more day, max acetate - lytes fluctuation with increasing TPN rate Daily multivitamin, trace elements, folate, thiamine and Pepcid 20mg  in TPN Continue sensitive SSI Q6H.  D/C SSI if CBGs remain controlled at goal TPN rate. NS at 50 ml/hr per MD.  Monitor volume status/SCr. F/U AM labs   12/4, PharmD., BCPS Clinical Pharmacist Clinical phone for Mar 24, 2018 until 3:30pmVinnie [place] If after 3:30pm, please refer to [facility] for unit-specific pharmacist

## 2018-03-24 NOTE — Progress Notes (Signed)
NAME:  Kevin Jimenez, MRN:  [id number], DOB:  23-Aug-1950, LOS: 4 ADMISSION DATE:  24-Mar-2018, CONSULTATION DATE:  03-24-18 REFERRING MD:  Corliss Skains CHIEF COMPLAINT:  Abd pain   Brief History   Kevin Jimenez is a 67 y.o. male who was admitted 10/3 with SBO and perforated appendicitis.  He was taken to the OR where he had ex-lap, appendectomy, drainage of intraabdominal abscess, lysis of adhesions with release of SBO. Post operatively, he remained ventilated; therefore, PCCM was asked to assist with vent management.  Significant Hospital Events   10/3 Admit 10/4 Right pigtail placed  Consults: date of consult/date signed off & final recs:  Surgery 10/3 > admit. PCCM 10/3  Procedures (surgical and bedside):  ETT 10/3 > Radial art line 10/3 >  RLQ JP drain 10/3 >  Right pigtail chest tube 02/24/2018>>  Significant Diagnostic Tests:  CT chest / abd / pelv 10/3 > complete or high grade SBO likely due to an internal hernia, multifocal PNA, dilated bladder with mod bilateral hydroureter and mild to mod bilateral hydronephrosis, small loculated right pleural effusion, cholelithiasis.  Micro Data: Blood 10/3 >> GPC's in clusters 2/2 >> MRSA>> BCID 10/2 >> staph species  Urine 10/3 >  Abd fluid 10/3 >> few staph >>   Antimicrobials:  Vanc 10/3 >>  Zosyn 10/3 >>  Subjective:  Right pigtail chest tube with minimal drainage despite intrapleural tPA.   Objective   Blood pressure 118/64, pulse 94, temperature 98.7 F (37.1 C), temperature source Axillary, resp. rate 20, height 5\' 11"  (1.803 m), weight 51.7 kg, SpO2 95 %.    Vent Mode: PCV FiO2 (%):  [60 %-70 %] 60 % Set Rate:  [18 bmp] 18 bmp PEEP:  [10 cmH20] 10 cmH20 Plateau Pressure:  [22 cmH20-29 cmH20] 29 cmH20   Intake/Output Summary (Last 24 hours) at 03/23/2018 1022 Last data filed at 03/01/2018 1000 Gross per 24 hour  Intake 5498.12 ml  Output 1185 ml  Net 4313.12 ml   Filed Weights   March 24, 2018 1025 02/24/18 1200  Weight:  47.6 kg 51.7 kg    Examination: General: Cachectic male sedated on vent, in NAD HEENT: [place] / AT.  ETT in place Neuro: Sedated, withdraws to tactile stimuli CV: IRIR, no M/R/G PULM: Decreased breath sounds on the right coarse rhonchi otherwise.  Right pigtail chest tube in place, minimal increase in output despite tPA. GI: Nondistended, abdominal drain noted, abdominal incision with dressing dry clean dry and intact Extremities: warm/dry, 1 edema  Skin: no rashes or lesions   Assessment & Plan:   SBO with peritonitis and perforated appendicitis - s/p ex-lap, appendectomy, drainage of intraabdominal abscess, lysis of adhesions with release of SBO. P: Postoperative care per [facility] [place] surgery Continue antibiotics Continue TNA feedings  Acute Respiratory Insufficiency - s/p intubation prior to OR.  02/26/2018 required 100% FiO2 and PEEP. P: Currently on pressure control ventilation 20/10 No weaning given increased oxygenation requirements  Right Loculated Pleural Effusion - hx of empyema in 11/2017 s/p pigtail chest tube per CVTS, cultures negative at that time.  P: Lytics started intrapleurally 10/6 but not much improvement in output despite this Discussed with Dr. 12/2017, will Denese Killings chest at bedside and assess if additional pockets to drain with either new larger bore chest tube versus second pigtail (favor larger bore chest tube given purulent drainage in current pigtail). Might need CVTS involvement given loculations  Multifocal PNA. P: ABX as above  Follow CXR   Sedation needs due to mechanical  ventilation. P: Fentanyl drip with PRN versed RASS goal -1 Daily WUA  MRSA bacteremia. P: Continue abx per ID  AKI - Presumed post-renal from bladder outlet obstruction secondary to enlarged prostate. AGMA - due to renal failure + lactate.  Resolved. Hypomagnesemia - resolved. P: D/c bicarb gtt Monitor electrolytes Currently on TNA May need renal consult in near future  if no improvement  Obstructive uropathy with bilateral hydroureter and hydronephrosis - reportedly seen by urology in ED but no notes in system yet. Hematuria P: Currently has Foley catheter in place Intermittently check a bladder scan to confirm Foley is patent Leave foley in place for outpatient follow up with urology  Hx HIV, HCV. 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 P:  Continue thiamine and folic acid.  Shock - due to MRSA bacteremia. Hx sCHF (Echo from 12/10/17 with EF 30 - 35%), HTN. AF RVR - RVR resolved. P: Continue neosynephrine as needed, goal MAP > 65 Continue fluids  Disposition / Summary of Today's Plan March 16, 2018   Continue abx / supportive care. Holding intrapleural tPA as did not change output much. Will assess chest under [place] and eval whether needs 2nd pigtail versus large bore chest tube placed. Might need CVTS input given loculations.    Diet: NPO with TNA. Pain/Anxiety/Delirium protocol (if indicated): fentanyl gtt / midazolam PRN. VAP protocol (if indicated): In place. DVT prophylaxis: SCD's. GI prophylaxis: Famotidine. Hyperglycemia protocol: SSI. Mobility: Bedrest. Code Status: DNR (Dr. Carlota Raspberry confirmed with pt's mother overnight 10/3).   Family Communication: No family at bedside 10/5, 10/7.  Labs   CBC: Recent Labs  Lab 03/04/2018 1036 02/24/18 0556 02/25/18 0535 02/26/18 0543 2018/03/16 0514  WBC 28.0* 21.0* 22.2* 27.4* 25.8*  NEUTROABS  --  20.2* 21.1* 26.0* 24.0*  HGB 11.8* 9.9* 8.9* 8.8* 9.1*  HCT 36.2* 30.3* 27.0* 26.9* 28.0*  MCV 91.9 91.5 90.9 92.1 92.1  PLT 436* 286 201 150 141*   Basic Metabolic Panel: Recent Labs  Lab 02/24/18 0556 02/25/18 0042 02/25/18 0535 02/26/18 0543 16-Mar-2018 0514  NA 136 137 137 137 134*  K 5.2* 4.4 4.5 4.0 3.6  CL 107 104 101 100 97*  CO2 18* 21* 23 26 27   GLUCOSE 124* 124* 121* 131* 119*  BUN 89* 86* 86* 87* 94*  CREATININE  3.66* 3.65* 3.65* 3.39* 3.34*  CALCIUM 8.1* 7.3* 7.6* 7.2* 7.2*  MG 1.7 1.9 1.8 2.3 2.2  PHOS 3.6  --  3.9 5.3* 4.8*   GFR: Estimated Creatinine Clearance: 15.7 mL/min (A) (by C-G formula based on SCr of 3.34 mg/dL (H)). Recent Labs  Lab 03/12/2018 1222 02/22/2018 1452 03/14/2018 2030 02/24/18 0556 02/25/18 0535 02/26/18 0543 Mar 16, 2018 0514  WBC  --   --   --  21.0* 22.2* 27.4* 25.8*  LATICACIDVEN 3.03* 2.50* 1.3  --   --   --   --    Liver Function Tests: Recent Labs  Lab 03/07/2018 1036 03/03/2018 2057 02/25/18 0535 03-16-18 0514  AST 31 26 35 50*  ALT 12 9 9 15   ALKPHOS 89 55 51 50  BILITOT 1.6* 1.8* 1.3* 0.8  PROT 7.2 6.0* 4.7* 4.5*  ALBUMIN 2.1* 2.0* 1.3* <1.0*   Recent Labs  Lab 02/26/2018 1036  LIPASE 20   No results for input(s): AMMONIA in the last 168 hours. ABG    Component Value Date/Time   PHART 7.334 (L) 02/26/2018 0854   PCO2ART 49.1 (  H) 02/26/2018 0854   PO2ART 124.0 (H) 02/26/2018 0854   HCO3 26.1 02/26/2018 0854   TCO2 28 02/26/2018 0854   ACIDBASEDEF 2.0 02/26/2018 0506   O2SAT 99.0 02/26/2018 0854    Coagulation Profile: No results for input(s): INR, PROTIME in the last 168 hours.   Cardiac Enzymes: No results for input(s): CKTOTAL, CKMB, CKMBINDEX, TROPONINI in the last 168 hours.   HbA1C: No results found for: HGBA1C   CBG: Recent Labs  Lab 02/26/18 0420 02/26/18 1147 02/26/18 1838 02/26/18 2333 02/26/18 2359  GLUCAP 111* 110* 96 55* 102*    CC time: 35 min.   Rutherford Guys, [place] [facility] Pager: [phone]  or [phone] 02/24/2018, 10:44 AM

## 2018-03-24 NOTE — Procedures (Signed)
Extubation Procedure Note  Patient Details:   Name: Kevin Jimenez DOB: Jul 21, 1950 MRN: [id number]   Airway Documentation:    Vent end date: 03-10-2018 Vent end time: 1554   Evaluation  O2 sats: currently acceptable Complications: No apparent complications Patient did tolerate procedure well. Bilateral Breath Sounds: Coarse crackles, Diminished     Pt extubated to RA per Withdrawal of Life Protocol  Carolan Shiver 03/10/2018, 3:55 PM

## 2018-03-24 NOTE — Progress Notes (Signed)
4 Days Post-Op   Subjective/Chief Complaint: Intubated, not responsive   Objective: Vital signs in last 24 hours: Temp:  [97.4 F (36.3 C)-98.5 F (36.9 C)] 98 F (36.7 C) (10/07 0400) Pulse Rate:  [25-145] 94 (10/07 0720) Resp:  [15-27] 20 (10/07 0720) BP: (67-134)/(43-74) 118/64 (10/07 0720) SpO2:  [84 %-100 %] 95 % (10/07 0720) FiO2 (%):  [60 %-100 %] 60 % (10/07 0720) Last BM Date: (PTA)  Intake/Output from previous day: 10/06 0701 - 10/07 0700 In: 6290.6 [I.V.:6140.6; IV Piggyback:150.1] Out: 1235 [Urine:365; Drains:700; Chest Tube:170] Intake/Output this shift: No intake/output data recorded.  On vent not responsive Chest tube with purulence jp serous, abd distended, no bs, wound clean   Lab Results:  Recent Labs    02/26/18 0543 06-Mar-2018 0514  WBC 27.4* 25.8*  HGB 8.8* 9.1*  HCT 26.9* 28.0*  PLT 150 141*   BMET Recent Labs    02/26/18 0543 Mar 06, 2018 0514  NA 137 134*  K 4.0 3.6  CL 100 97*  CO2 26 27  GLUCOSE 131* 119*  BUN 87* 94*  CREATININE 3.39* 3.34*  CALCIUM 7.2* 7.2*   PT/INR No results for input(s): LABPROT, INR in the last 72 hours. ABG Recent Labs    02/26/18 0604 02/26/18 0854  PHART 7.301* 7.334*  HCO3 26.8 26.1    Studies/Results: Dg Chest Port 1 View  Result Date: Mar 06, 2018 CLINICAL DATA:  67 year old male with respiratory failure. History of bowel obstruction peritonitis. Subsequent encounter. EXAM: PORTABLE CHEST 1 VIEW COMPARISON:  02/26/2018 and 02/25/2018 chest x-ray FINDINGS: Endotracheal tube tip 6.7 cm above the carina. Right central line tip proximal to mid superior vena cava level. Nasogastric tube courses below the diaphragm. Tip is not included on the present exam. Right-sided pigtail catheter is in place. Consolidation right mid lung with straight border inferior aspect may represent loculated fluid adjacent to fissure. Cannot exclude loculated hydropneumothorax. Appearance without significant change. Loculated  pleural fluid along the peripheral aspect of the right lower thorax. Patchy consolidation throughout remainder of lungs with mild progression of left perihilar region. Question progressive infiltrate/pulmonary edema superimposed upon chronic changes. Cavity appearance peripheral aspect left mid lung consolidation. Progressive consolidation right lung apex may represent progressive infiltrate or shifting pleural fluid. Heart size within normal limits.  Calcified aorta. IMPRESSION: When compared to recent examination, progressive consolidation right lung apex and mild progression of pulmonary parenchymal changes left perihilar region. Findings may reflect progressive infectious infiltrates. Shifting pleural fluid may contribute to the right apical parenchymal changes. Findings otherwise similar to prior exam as noted above. Electronically Signed   By: Lacy Duverney M.D.   On: Mar 06, 2018 07:01   Dg Chest Port 1 View  Result Date: 02/26/2018 CLINICAL DATA:  Ventilator dependent. EXAM: PORTABLE CHEST 1 VIEW COMPARISON:  02/25/2018 chest radiograph. FINDINGS: Stable cardiac silhouette given projection and technique. Endotracheal tube tip projects 7.8 cm above carina. Right central venous catheter tip projects over lower SVC. Enteric tube tip extends below the field of view into the gastric body. There is a right pleural drain in situ. No residual right-sided pneumothorax component identified. Stable small to moderate right pleural effusion. Increasing patchy consolidation within the upper lung zones bilaterally. IMPRESSION: Increasing patchy consolidation within the upper lung zones bilaterally. Stable small to moderate right effusion. No residual right pneumothorax component identified. Stable lines and tubes. Electronically Signed   By: Mitzi Hansen M.D.   On: 02/26/2018 05:06    Anti-infectives: Anti-infectives (From admission, onward)   Start  Dose/Rate Route Frequency Ordered Stop   02/25/18  1200  vancomycin (VANCOCIN) 500 mg in sodium chloride 0.9 % 100 mL IVPB     500 mg 100 mL/hr over 60 Minutes Intravenous Every 48 hours 03/09/2018 2003     02/24/18 2030  piperacillin-tazobactam (ZOSYN) IVPB 3.375 g  Status:  Discontinued     3.375 g 12.5 mL/hr over 240 Minutes Intravenous Every 8 hours 03/07/2018 1952 03/19/2018 2003   02/24/18 0400  piperacillin-tazobactam (ZOSYN) IVPB 3.375 g  Status:  Discontinued     3.375 g 12.5 mL/hr over 240 Minutes Intravenous Every 8 hours 02/22/2018 1950 03/23/2018 1952   03/19/2018 2030  piperacillin-tazobactam (ZOSYN) IVPB 2.25 g     2.25 g 100 mL/hr over 30 Minutes Intravenous Every 8 hours 03/07/2018 2003     03/14/2018 2000  piperacillin-tazobactam (ZOSYN) IVPB 3.375 g  Status:  Discontinued     3.375 g 100 mL/hr over 30 Minutes Intravenous To Surgery 03/10/2018 1948 03/17/2018 1952   03/18/2018 1930  piperacillin-tazobactam (ZOSYN) IVPB 3.375 g  Status:  Discontinued     3.375 g 100 mL/hr over 30 Minutes Intravenous To Surgery 03/10/2018 1928 03/11/2018 1938   02/21/2018 1200  vancomycin (VANCOCIN) IVPB 1000 mg/200 mL premix     1,000 mg 200 mL/hr over 60 Minutes Intravenous  Once 03/19/2018 1158 03/19/2018 1416   03/19/2018 1200  ceFEPIme (MAXIPIME) 2 g in sodium chloride 0.9 % 100 mL IVPB     2 g 200 mL/hr over 30 Minutes Intravenous  Once 03/05/2018 1158 03/04/2018 1323      Assessment/Plan: Perforated appendicitis with abscess and SBO, POD4, s/p ex lap with appendectomy, drain placement, and LOA, Dr. Janee Morn 10-3 -Con't WTD dsg changes. -cont JP drain -cont NGT  -onTNA, expect long ileus  VDRF/multifocal PNA with recent empyema, recurrent pleural effusion/COPD -defer to CCM  -ID has seen, TEE recommended if can tolerate -may need larger drainage catheter right chest  ARF/obstructive uropathy with bilateral hydroureter noted on CT yesterday -cr 3.34  Apparently made dnr yesterday at some point by his mother    Emelia Loron 03/23/2018

## 2018-03-24 NOTE — Progress Notes (Signed)
[facility] for Infectious Disease  Date of Admission:  03/21/2018   Total days of antibiotics 5        Day 5 vancomycin         Day 5 piperacillin-tazobactam   Patient ID: Kevin Jimenez is a 67 y.o. male with  Principal Problem:   MRSA bacteremia Active Problems:   Sepsis ([place])   Peritonitis ([place])   Pelvic abscess in male [facility])   AIDS (acquired immune deficiency syndrome) ([place])   Chronic hepatitis C without hepatic coma ([place])   History of substance abuse ([place])   Tobacco abuse   Protein-calorie malnutrition, severe   SBO (small bowel obstruction) ([place])   Acute renal failure (ARF) ([facility])   Metabolic acidosis   Endotracheally intubated   Ruptured appendicitis   . alteplase (TPA) for intrapleural administration  10 mg Intrapleural Q12H   And  . pulmozyme (DORNASE) for intrapleural administration  5 mg Intrapleural Q12H  . chlorhexidine gluconate (MEDLINE KIT)  15 mL Mouth Rinse BID  . Chlorhexidine Gluconate Cloth  6 each Topical Q0600  . heparin injection (subcutaneous)  5,000 Units Subcutaneous Q8H  . insulin aspart  0-9 Units Subcutaneous Q6H  . ipratropium-albuterol  3 mL Nebulization Q6H  . mouth rinse  15 mL Mouth Rinse 10 times per day  . mupirocin ointment  1 application Nasal BID  . vancomycin variable dose per unstable renal function (pharmacist dosing)   Does not apply See admin instructions    SUBJECTIVE: Intubated   No Known Allergies  OBJECTIVE: Vitals:   03-Mar-2018 0600 03/03/18 0615 03-03-2018 0720 03-03-2018 0800  BP: 110/61 (!) 108/58 118/64   Pulse: 93 95 94   Resp: 19 18 20    Temp:    98.7 F (37.1 C)  TempSrc:    Axillary  SpO2: 93% 94% 95%   Weight:      Height:       Body mass index is 15.9 kg/m.  Physical Exam  Constitutional:  Cachectic. Intubated.   HENT:  Mouth/Throat: No oropharyngeal exudate.  Eyes:  Eyes closed   Neck: Normal range of motion.  Cardiovascular: Normal rate, regular rhythm and normal heart sounds.    Pulmonary/Chest: Effort normal. No respiratory distress.  Coarse breath sounds. 60% FiO2 and 10 peep  R CT pigtail with mostly serous fluid   Abdominal: He exhibits distension.  MAI clean and dry. RQ JP drain with clear/thin serous drainage.   Lymphadenopathy:    He has no cervical adenopathy.  Vitals reviewed.   Lab Results Lab Results  Component Value Date   WBC 25.8 (H) March 03, 2018   HGB 9.1 (L) 03-03-18   HCT 28.0 (L) 03/03/18   MCV 92.1 03-03-2018   PLT 141 (L) 03/03/2018    Lab Results  Component Value Date   CREATININE 3.34 (H) March 03, 2018   BUN 94 (H) 03-03-2018   NA 134 (L) 03-Mar-2018   K 3.6 03-03-18   CL 97 (L) 03-03-18   CO2 27 Mar 03, 2018    Lab Results  Component Value Date   ALT 15 2018-03-03   AST 50 (H) 03-Mar-2018   ALKPHOS 50 March 03, 2018   BILITOT 0.8 03-Mar-2018     Microbiology: BCx 10/03 >> MRSA  BCx  BCx  Peritoneal Swab 10/03 >> Few MRSA, no anaerobes   TTE 10/06 >> possible veg on TV. Recommended TEE.   ASSESSMENT: 67 y.o. male with HIV/AIDS, Hep C infection (untreated) with ruptured appendicitis complicated by  intraperitoneal abscess, peritonitis and MRSA bacteremia. Blood cultures have cleared quickly. TTE mentions possible TV IE >> should further characterize if he becomes stable to do so to help with duration of therapy. Would continue piperacillin-tazobactam for intra-abdominal infection. JP drains with 700 cc output last night, fluid appears to be pretty serous. 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 His vanc trough today was 11. Will see what his creatinine trend is tomorrow and make decision to change agent.   PLAN: 1. Continue vancomycin  2. Follow kidney function closely  3. Continue piperacillin-tazobactam    Janene Madeira,  MSN, NP-C [facility] for Infectious [place] Cell: [phone] Pager: [phone]  03/02/2018  9:17 AM

## 2018-03-24 NOTE — Progress Notes (Signed)
   13-Mar-2018 1500  Clinical Encounter Type  Visited With Patient and family together  Visit Type Patient actively dying  Referral From Nurse  Spiritual Encounters  Spiritual Needs Prayer;Emotional  Responded to Page for end of life for patient 4 [place] 22. Mother and sisters at bedside. Read scripture and prayed with family. Family is strong in their faith. Family is sad but agree to let their love one go. Provided the ministry of presence and listening. Leane Call [phone]

## 2018-03-24 NOTE — Progress Notes (Addendum)
Pharmacy Antibiotic Note  Kevin Jimenez is a 68 y.o. male admitted on 03/08/2018 with Perforated appendicitis with intra-abdominal abcsess and small bowel obstruction.; MRSA bacteremia, ?endocaritis.  Pharmacy has been consulted for Zosyn and Vanco dosing.  CC/HPI: 76 YOM who presented on 10/3 with abd pain, SOB, weakness. CT findings c/w SBO   PMH: arthritis, Hep C, COPD, PSA, HIV, HTN, ETOH, heroin, tobacco, recent PNA, h/o abdominal surgery (inguinal hernia repair)  Significant events: previously admitted 7/17-8/6 for R-sided PNA with abscess and empyema requiring chest tube placement and acute on chronic systolic CHF.  ID: h/o HIV,  Abx for r/o Perforated appendicitis with intra-abdominal abcsess and small bowel obstruction + UTI (pus with foley placement) + PNA on CT. Afeb, WBC 25.8. Scr 3.34 today.  10/3 Zosyn>> 10/3 Vanco>>  Vanc random level this am 11 mcg/ml  Plan: Vancomycin 750mg  IV x 1 today. Will redose based on random levels. Continue Zosyn 2.25g IV q 8 hrs until Scr improves  Height: 5\' 11"  (180.3 cm) Weight: 113 lb 15.7 oz (51.7 kg) IBW/kg (Calculated) : 75.3  Temp (24hrs), Avg:98 F (36.7 C), Min:97.4 F (36.3 C), Max:98.7 F (37.1 C)  Recent Labs  Lab 02/22/2018 1036 03/03/2018 1222 02/24/2018 1452 03/04/2018 2030  02/24/18 0556 02/25/18 0042 02/25/18 0535 02/26/18 0543 28-Feb-2018 0514  WBC 28.0*  --   --   --   --  21.0*  --  22.2* 27.4* 25.8*  CREATININE 4.19*  --   --   --    < > 3.66* 3.65* 3.65* 3.39* 3.34*  LATICACIDVEN  --  3.03* 2.50* 1.3  --   --   --   --   --   --   VANCORANDOM  --   --   --   --   --   --   --   --   --  11   < > = values in this interval not displayed.    Estimated Creatinine Clearance: 15.7 mL/min (A) (by C-G formula based on SCr of 3.34 mg/dL (H)).    No Known Allergies   04/28/18, PharmD, [facility] Clinical Pharmacist Please see AMION for all Pharmacists' Contact Phone Numbers 02/28/18, 8:56 AM

## 2018-03-24 DEATH — deceased

## 2019-04-24 IMAGING — CT CT CHEST W/O CM
2 of 4 series · 15 of 36 positions shown, 18 images · non-contrast
Comparison: None.

CLINICAL DATA: History of substance abuse and HIV. Now presents
with subcutaneous gas in the right chest wall.

EXAM:
CT CHEST WITHOUT CONTRAST
TECHNIQUE: Multidetector CT imaging of the chest was performed following the
standard protocol without IV contrast.

[Series 4: thorax 2.0 · axial · 0.61mm/px · z∈[+964,+1252]mm · 12 of 168 slices shown, 15 images]
[im 12/168  mediastinal]
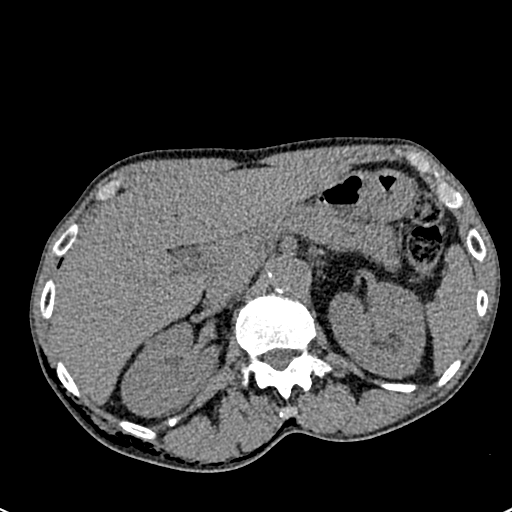
[im 12/168  lung]
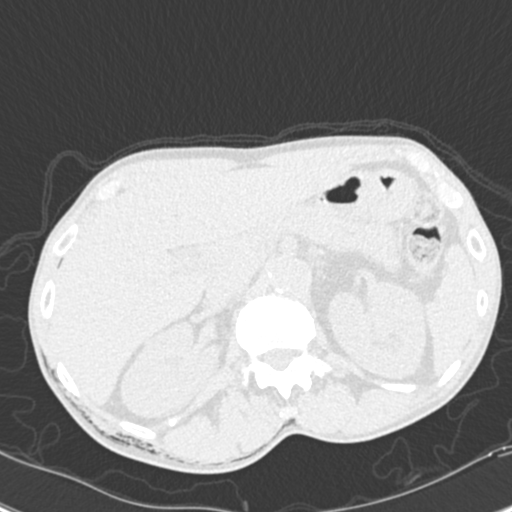
[im 24/168  lung]
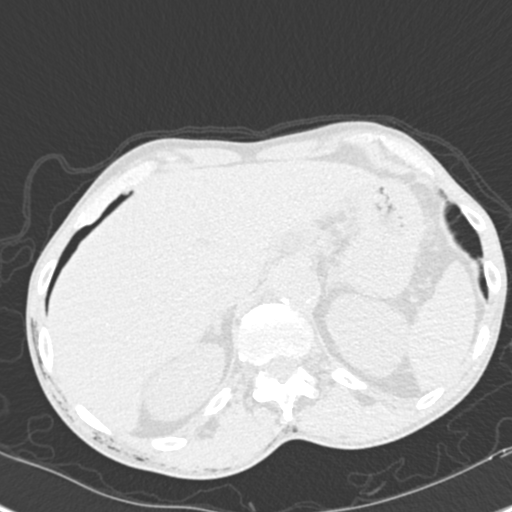
[im 36/168  lung]
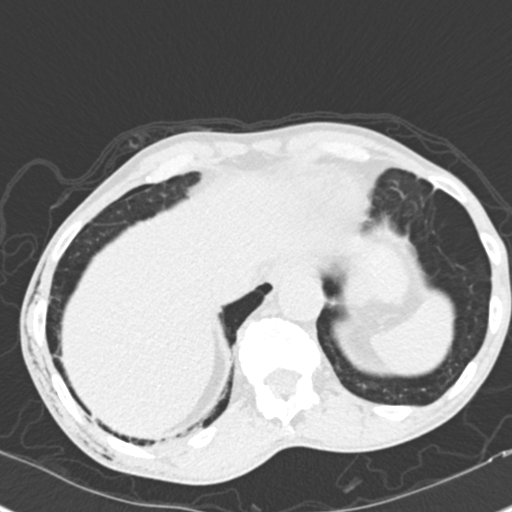
[im 48/168  lung]
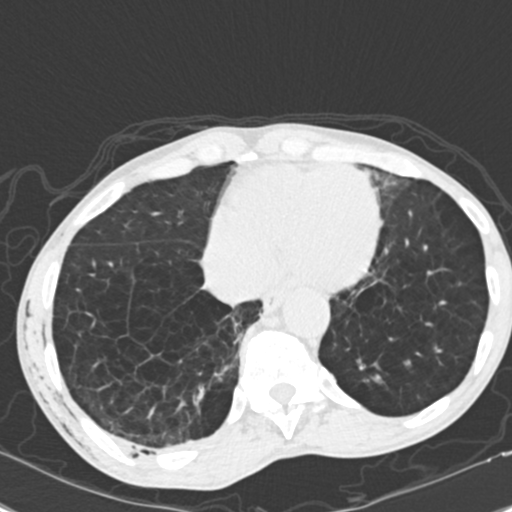
[im 60/168  mediastinal]
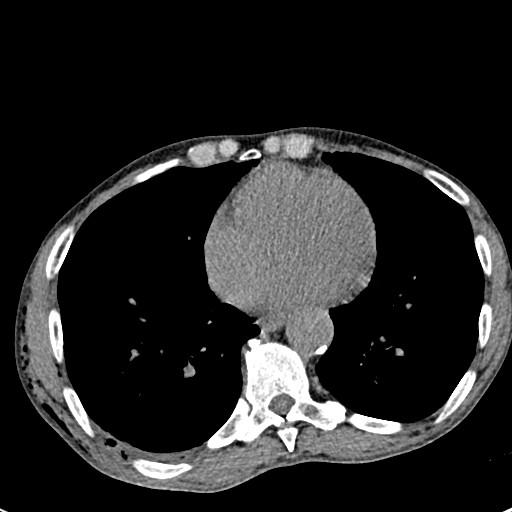
[im 60/168  lung]
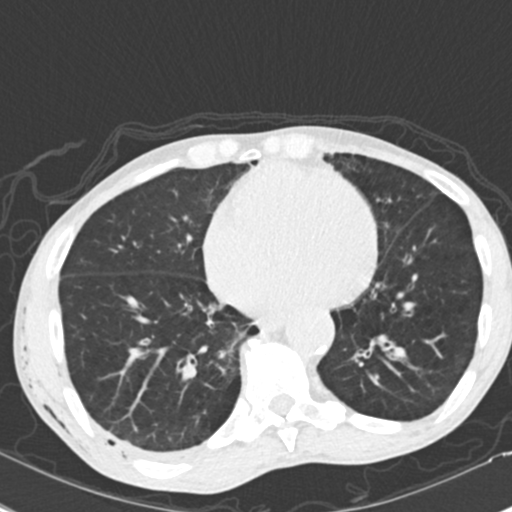
[im 72/168  lung]
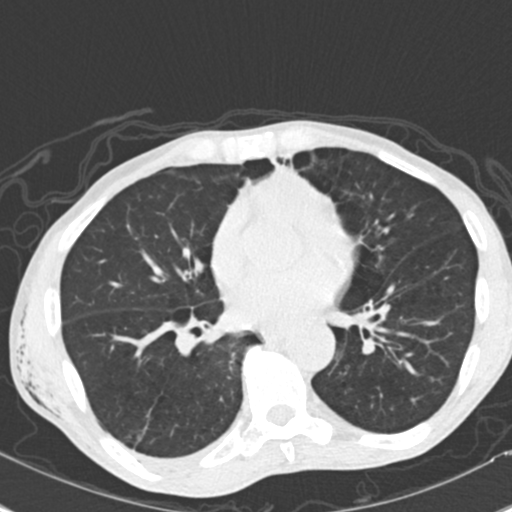
[im 96/168  lung]
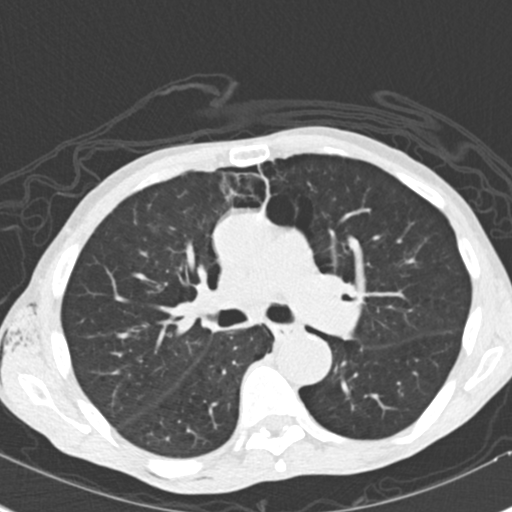
[im 108/168  lung]
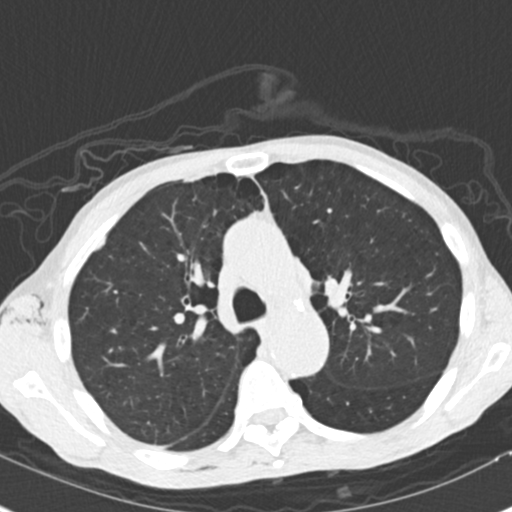
[im 120/168  mediastinal]
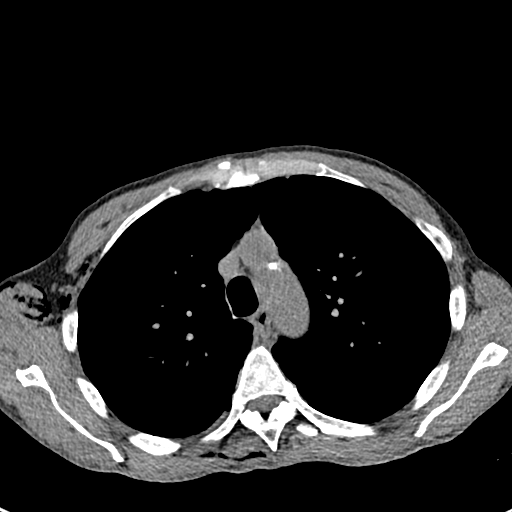
[im 120/168  lung]
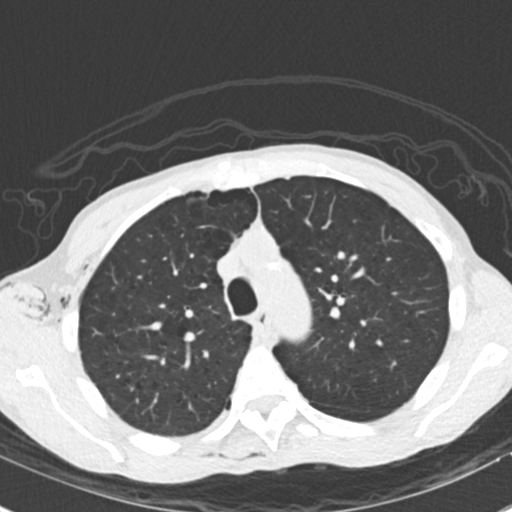
[im 132/168  lung]
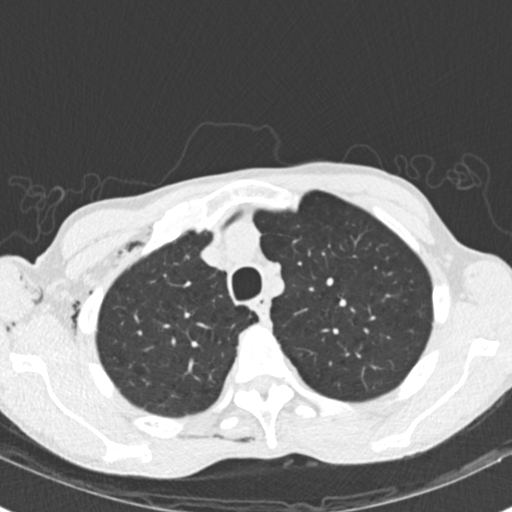
[im 144/168  lung]
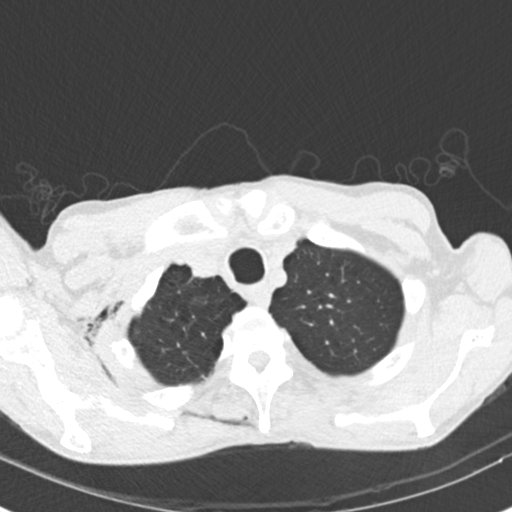
[im 156/168  lung]
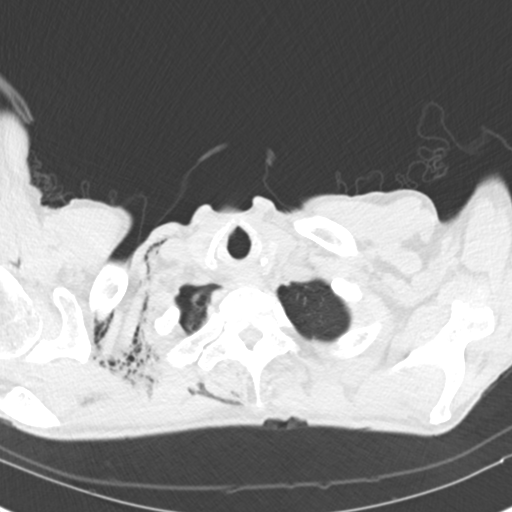

[Series 6: coronal · coronal · 0.65mm/px · 3 of 100 slices shown]
[im 20/100  lung]
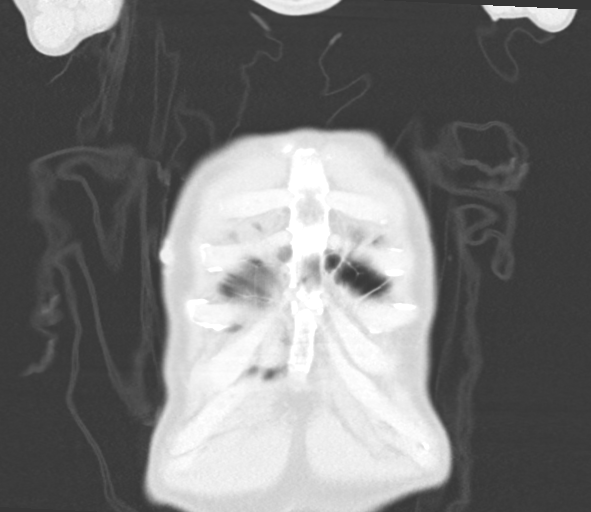
[im 40/100  lung]
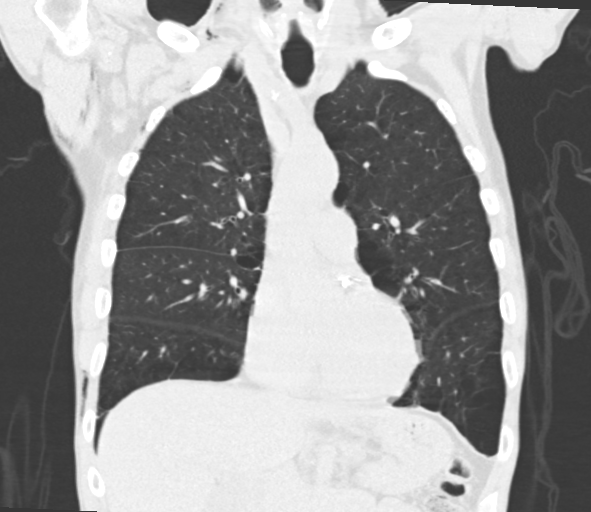
[im 60/100  lung]
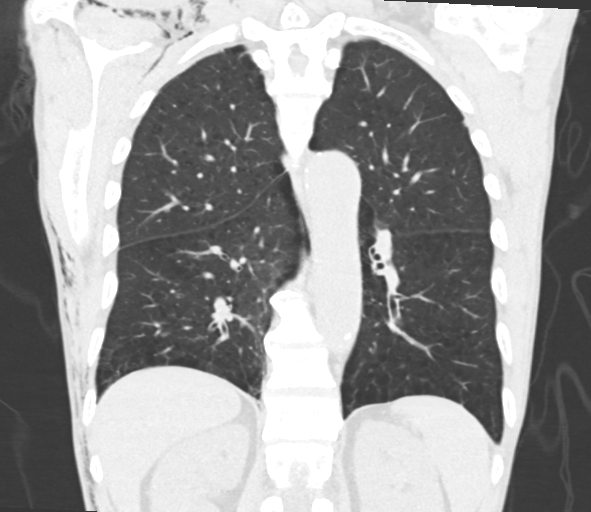

[15 of 36 positions shown; findings below may reference images not displayed]

FINDINGS: Cardiovascular: Normal heart size. No pericardial effusion. Aortic
atherosclerosis. Calcifications within the LAD and left circumflex
coronary artery.

Mediastinum/Nodes: The trachea appears patent and is midline. Normal
appearance of the esophagus. No enlarged mediastinal or hilar lymph
nodes.

Lungs/Pleura: No pleural effusion. Moderate changes of centrilobular
and paraseptal emphysema. Small right-sided anteromedial and apical
pneumothorax identified. 7 mm right lower lobe lung nodule
identified, image 98 of series 8. Moderate diffuse bronchial wall
thickening noted.

Upper Abdomen: No acute abnormality. Stones identified within the
gallbladder. Aortic atherosclerosis noted.

Musculoskeletal: No chest wall mass or suspicious bone lesions
identified. Right supraclavicular and right anterior, lateral and
posterior chest wall subcutaneous emphysema identified.
IMPRESSION: 1. Small right-sided pneumothorax. Moderate right-sided subcutaneous
chest wall gas is identified
2.  Emphysema (CBOPX-MRW.R).
3. Right lower lobe pulmonary nodule measures 7 mm. Non-contrast
chest CT at 6-12 months is recommended. If the nodule is stable at
time of repeat CT, then future CT at 18-24 months (from today's
scan) is considered optional for low-risk patients, but is
recommended for high-risk patients. This recommendation follows the
consensus statement: Guidelines for Management of Incidental
Pulmonary Nodules Detected on CT Images: From the [HOSPITAL]
4.  Aortic Atherosclerosis (CBOPX-9FI.I).
5. Critical Value/emergent results were called by telephone at the
time of interpretation on 03/30/2017 at [DATE] to Dr. CEOLA,
THATY, who verbally acknowledged these results.

## 2019-04-29 IMAGING — CR DG CHEST 2V
2 series · 2 of 2 positions shown · non-contrast
Comparison: CT chest and chest x-ray dated March 30, 2017.

CLINICAL DATA: Spontaneous pneumothorax.

EXAM:
CHEST  2 VIEW

[w chest pa]
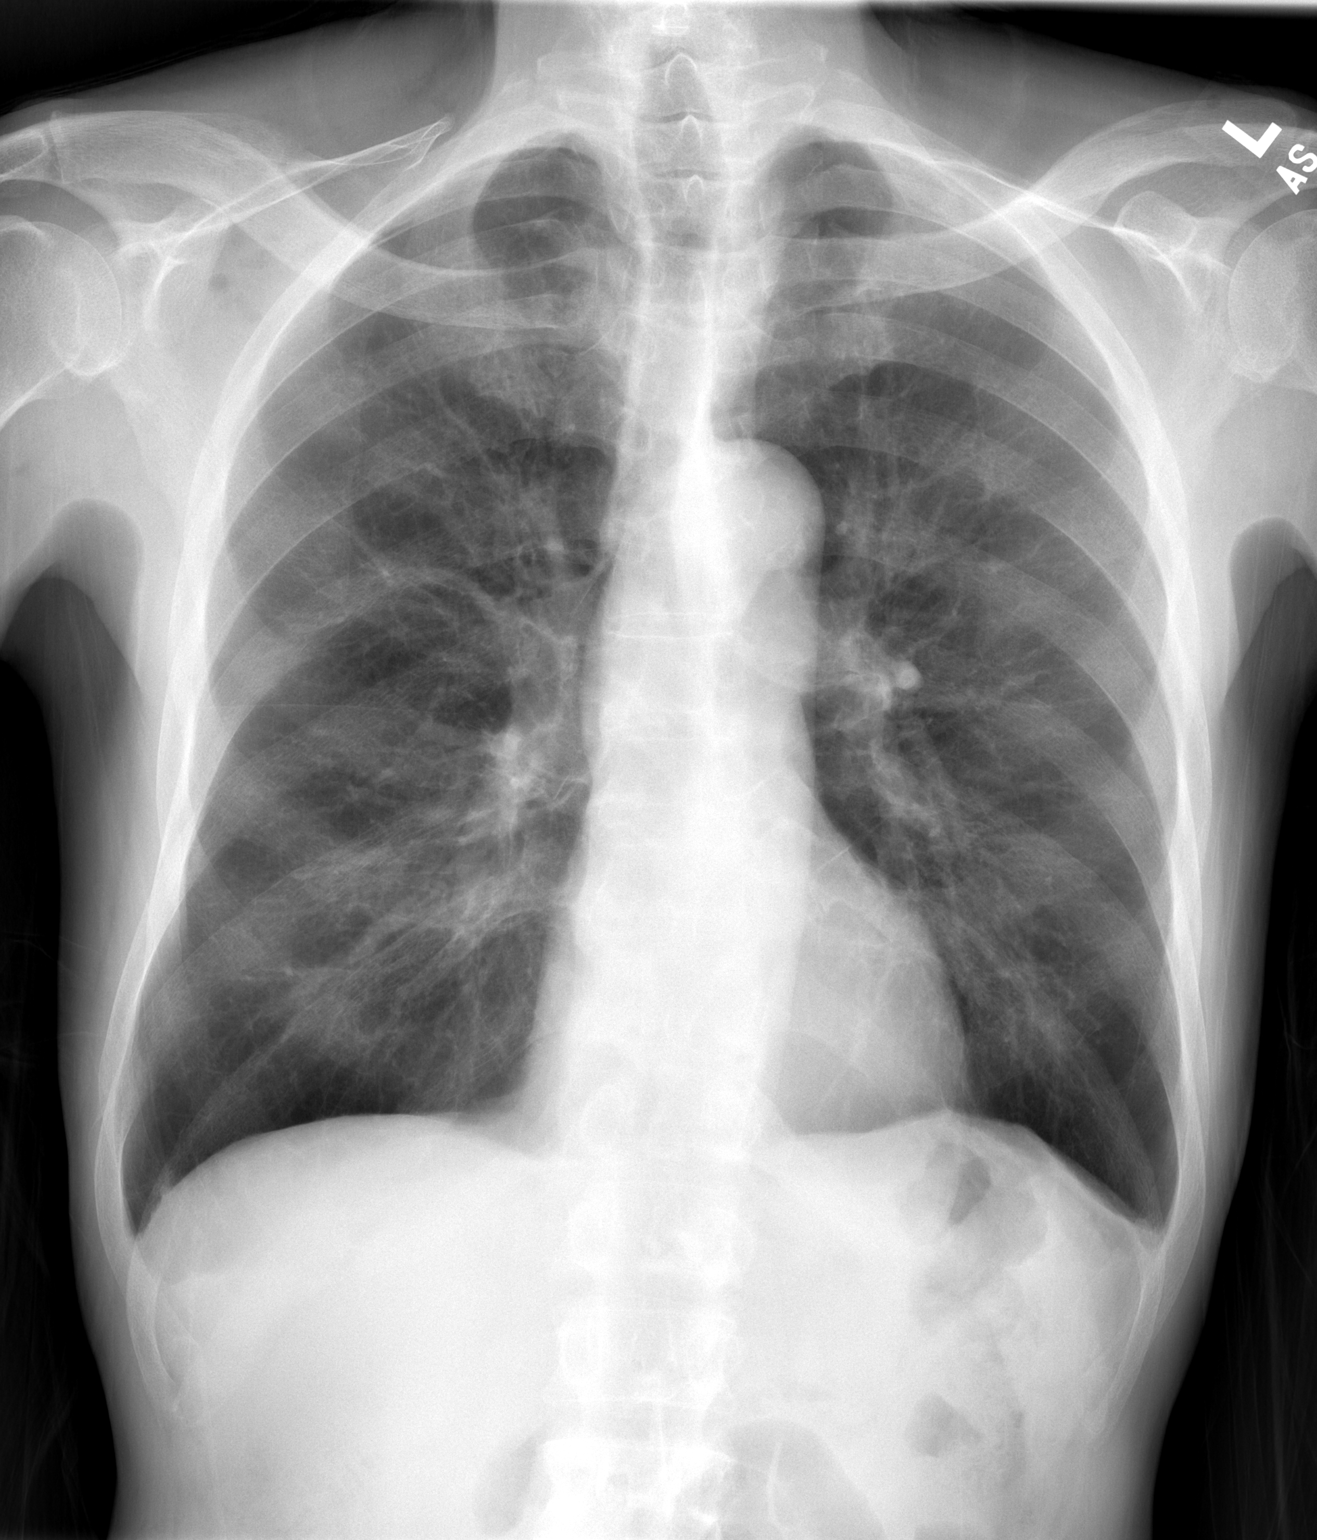

[w chest lat]
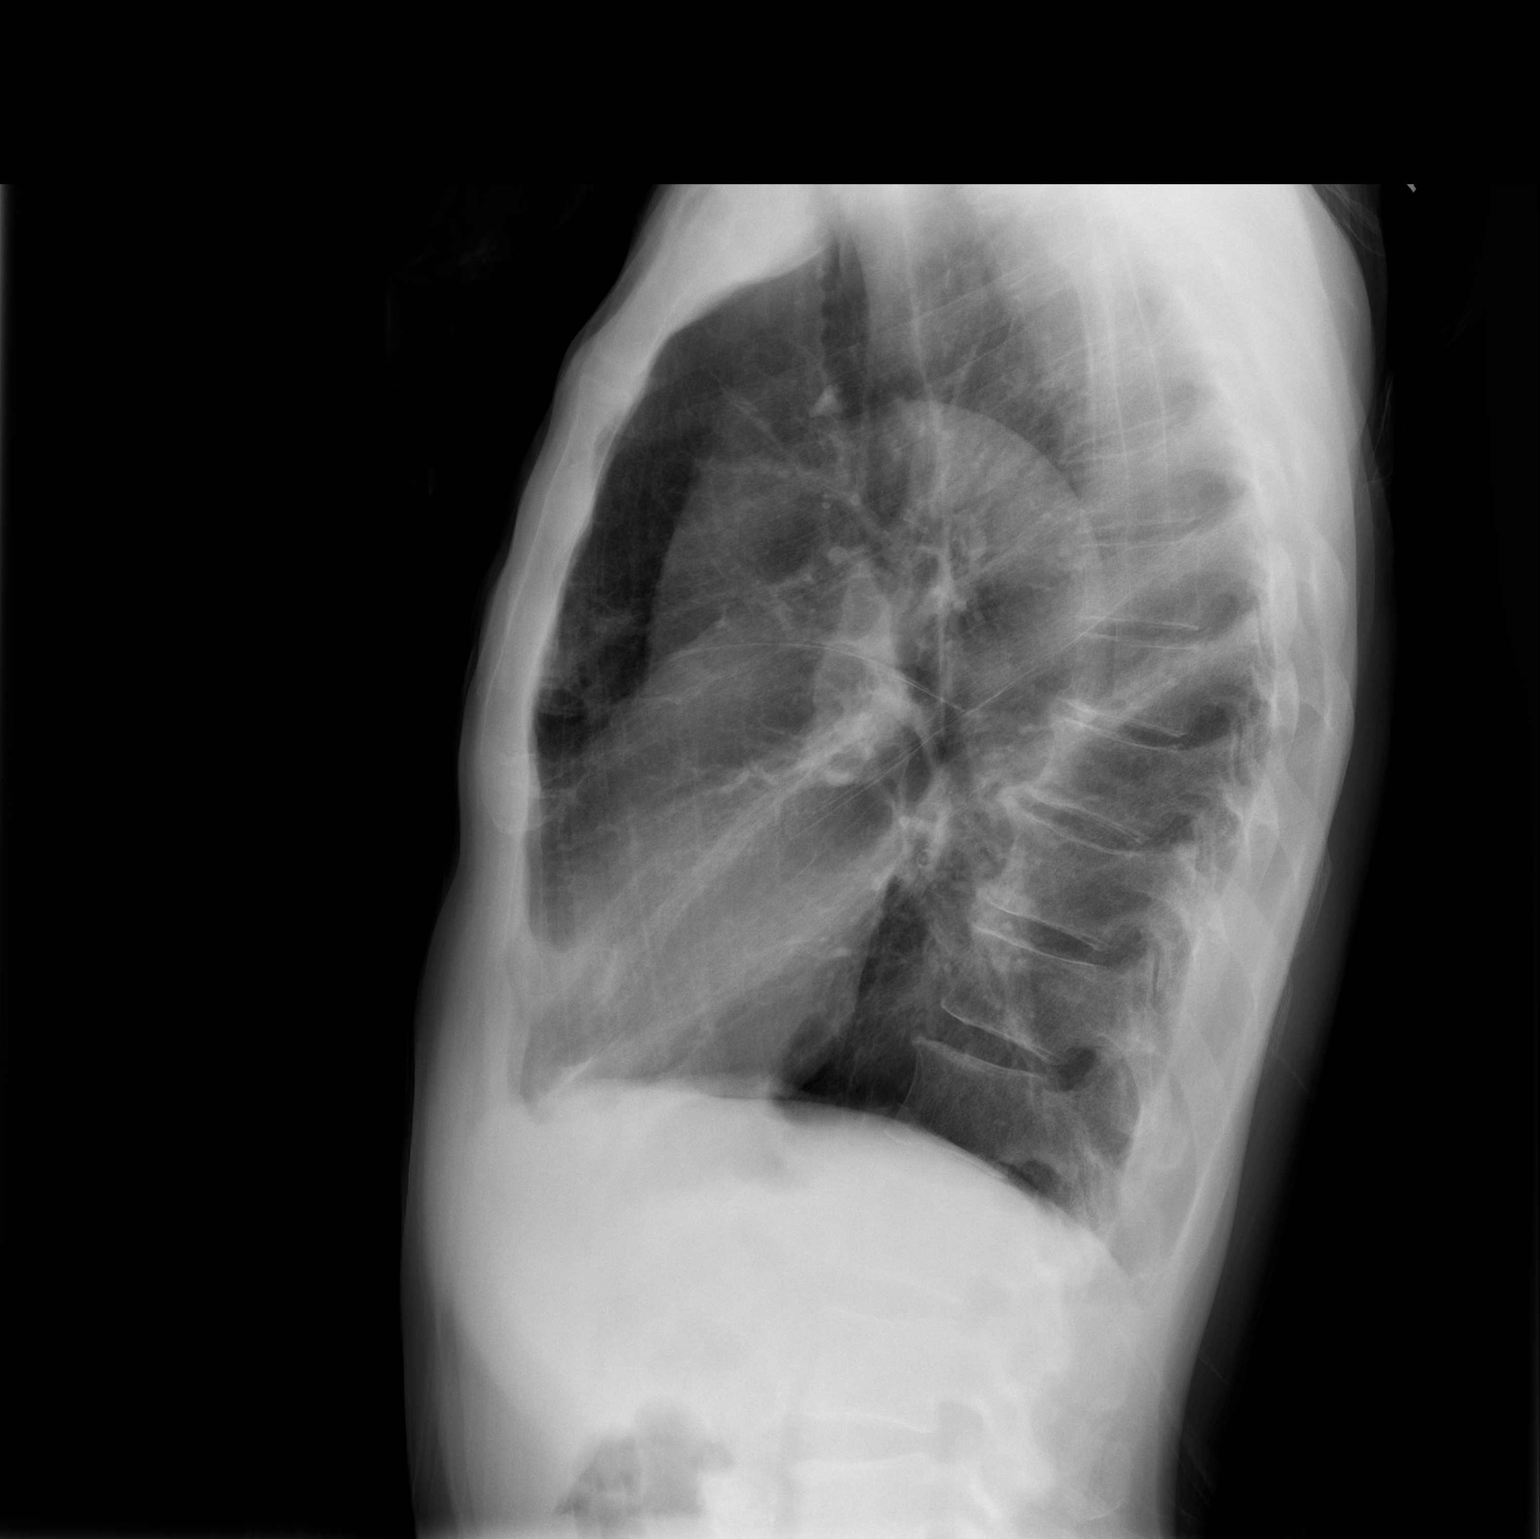

[2 of 2 positions shown; findings below may reference images not displayed]

FINDINGS: The cardiomediastinal silhouette is normal in size. Normal pulmonary
vascularity. Emphysematous changes again noted. No focal
consolidation, pleural effusion, or pneumothorax. Resolving
subcutaneous emphysema in the right neck and chest wall. No acute
osseous abnormality.
IMPRESSION: 1. No significant pneumothorax. Resolving subcutaneous emphysema in
the right neck and chest wall.
2.  Emphysema (3CADN-EM0.8).
